# Patient Record
Sex: Female | Born: 1940 | Race: White | Hispanic: No | Marital: Married | Smoking: Never smoker
Health system: Southern US, Community
[De-identification: ages and names within clinical notes are randomized; demographics above are authoritative.]

## PROBLEM LIST (undated history)

## (undated) DIAGNOSIS — D649 Anemia, unspecified: Secondary | ICD-10-CM

## (undated) DIAGNOSIS — M51369 Other intervertebral disc degeneration, lumbar region without mention of lumbar back pain or lower extremity pain: Secondary | ICD-10-CM

## (undated) DIAGNOSIS — M5136 Other intervertebral disc degeneration, lumbar region: Secondary | ICD-10-CM

## (undated) DIAGNOSIS — L570 Actinic keratosis: Secondary | ICD-10-CM

## (undated) DIAGNOSIS — M5030 Other cervical disc degeneration, unspecified cervical region: Secondary | ICD-10-CM

## (undated) DIAGNOSIS — E7800 Pure hypercholesterolemia, unspecified: Secondary | ICD-10-CM

## (undated) DIAGNOSIS — K562 Volvulus: Secondary | ICD-10-CM

## (undated) DIAGNOSIS — K219 Gastro-esophageal reflux disease without esophagitis: Secondary | ICD-10-CM

## (undated) DIAGNOSIS — I10 Essential (primary) hypertension: Secondary | ICD-10-CM

## (undated) DIAGNOSIS — H903 Sensorineural hearing loss, bilateral: Secondary | ICD-10-CM

## (undated) DIAGNOSIS — K56609 Unspecified intestinal obstruction, unspecified as to partial versus complete obstruction: Secondary | ICD-10-CM

## (undated) DIAGNOSIS — H8110 Benign paroxysmal vertigo, unspecified ear: Secondary | ICD-10-CM

## (undated) DIAGNOSIS — J45909 Unspecified asthma, uncomplicated: Secondary | ICD-10-CM

## (undated) HISTORY — DX: Actinic keratosis: L57.0

## (undated) HISTORY — DX: Unspecified intestinal obstruction, unspecified as to partial versus complete obstruction: K56.609

## (undated) HISTORY — PX: MULTIPLE TOOTH EXTRACTIONS: SHX2053

## (undated) HISTORY — PX: APPENDECTOMY: SHX54

## (undated) HISTORY — DX: Other intervertebral disc degeneration, lumbar region: M51.36

## (undated) HISTORY — DX: Other cervical disc degeneration, unspecified cervical region: M50.30

## (undated) HISTORY — DX: Benign paroxysmal vertigo, unspecified ear: H81.10

## (undated) HISTORY — DX: Sensorineural hearing loss, bilateral: H90.3

## (undated) HISTORY — DX: Unspecified asthma, uncomplicated: J45.909

## (undated) HISTORY — DX: Volvulus: K56.2

## (undated) HISTORY — DX: Other intervertebral disc degeneration, lumbar region without mention of lumbar back pain or lower extremity pain: M51.369

## (undated) HISTORY — DX: Anemia, unspecified: D64.9

## (undated) HISTORY — DX: Pure hypercholesterolemia, unspecified: E78.00

## (undated) HISTORY — DX: Gastro-esophageal reflux disease without esophagitis: K21.9

---

## 1997-11-06 ENCOUNTER — Ambulatory Visit (HOSPITAL_COMMUNITY): Admission: RE | Admit: 1997-11-06 | Discharge: 1997-11-06 | Payer: Self-pay | Admitting: Endocrinology

## 1998-11-06 ENCOUNTER — Other Ambulatory Visit: Admission: RE | Admit: 1998-11-06 | Discharge: 1998-11-06 | Payer: Self-pay | Admitting: Obstetrics and Gynecology

## 1999-11-06 ENCOUNTER — Other Ambulatory Visit: Admission: RE | Admit: 1999-11-06 | Discharge: 1999-11-06 | Payer: Self-pay | Admitting: Obstetrics and Gynecology

## 2003-03-21 ENCOUNTER — Ambulatory Visit (HOSPITAL_COMMUNITY): Admission: RE | Admit: 2003-03-21 | Discharge: 2003-03-21 | Payer: Self-pay | Admitting: Family Medicine

## 2003-09-19 ENCOUNTER — Other Ambulatory Visit: Admission: RE | Admit: 2003-09-19 | Discharge: 2003-09-19 | Payer: Self-pay | Admitting: Obstetrics and Gynecology

## 2008-12-21 ENCOUNTER — Other Ambulatory Visit: Admission: RE | Admit: 2008-12-21 | Discharge: 2008-12-21 | Payer: Self-pay | Admitting: Obstetrics and Gynecology

## 2009-01-31 ENCOUNTER — Encounter: Admission: RE | Admit: 2009-01-31 | Discharge: 2009-01-31 | Payer: Self-pay | Admitting: Family Medicine

## 2010-01-31 ENCOUNTER — Encounter: Admission: RE | Admit: 2010-01-31 | Discharge: 2010-01-31 | Payer: Self-pay | Admitting: Family Medicine

## 2010-02-04 ENCOUNTER — Encounter: Admission: RE | Admit: 2010-02-04 | Discharge: 2010-02-04 | Payer: Self-pay | Admitting: Family Medicine

## 2011-02-17 ENCOUNTER — Other Ambulatory Visit: Payer: Self-pay | Admitting: Family Medicine

## 2011-02-17 DIAGNOSIS — Z1231 Encounter for screening mammogram for malignant neoplasm of breast: Secondary | ICD-10-CM

## 2011-03-10 ENCOUNTER — Ambulatory Visit
Admission: RE | Admit: 2011-03-10 | Discharge: 2011-03-10 | Disposition: A | Discharge status: Home / Self Care | Payer: Medicare Other | Source: Ambulatory Visit | Attending: Family Medicine | Admitting: Family Medicine

## 2011-03-10 DIAGNOSIS — Z1231 Encounter for screening mammogram for malignant neoplasm of breast: Secondary | ICD-10-CM

## 2011-11-19 ENCOUNTER — Emergency Department (HOSPITAL_COMMUNITY): Payer: Medicare Other

## 2011-11-19 ENCOUNTER — Inpatient Hospital Stay (HOSPITAL_COMMUNITY)
Admission: EM | Admit: 2011-11-19 | Discharge: 2011-11-29 | DRG: 331 | Disposition: A | Discharge status: Home / Self Care | Payer: Medicare Other | Attending: General Surgery | Admitting: General Surgery

## 2011-11-19 ENCOUNTER — Encounter (HOSPITAL_COMMUNITY): Payer: Self-pay | Admitting: Emergency Medicine

## 2011-11-19 DIAGNOSIS — K56609 Unspecified intestinal obstruction, unspecified as to partial versus complete obstruction: Secondary | ICD-10-CM

## 2011-11-19 DIAGNOSIS — K565 Intestinal adhesions [bands], unspecified as to partial versus complete obstruction: Principal | ICD-10-CM | POA: Diagnosis present

## 2011-11-19 DIAGNOSIS — R339 Retention of urine, unspecified: Secondary | ICD-10-CM | POA: Diagnosis not present

## 2011-11-19 DIAGNOSIS — K560 Paralytic ileus: Secondary | ICD-10-CM | POA: Diagnosis not present

## 2011-11-19 DIAGNOSIS — R110 Nausea: Secondary | ICD-10-CM

## 2011-11-19 DIAGNOSIS — I10 Essential (primary) hypertension: Secondary | ICD-10-CM | POA: Diagnosis present

## 2011-11-19 DIAGNOSIS — K562 Volvulus: Secondary | ICD-10-CM | POA: Diagnosis present

## 2011-11-19 DIAGNOSIS — R109 Unspecified abdominal pain: Secondary | ICD-10-CM

## 2011-11-19 HISTORY — DX: Essential (primary) hypertension: I10

## 2011-11-19 LAB — URINALYSIS, ROUTINE W REFLEX MICROSCOPIC
Bilirubin Urine: NEGATIVE
Glucose, UA: NEGATIVE mg/dL
Hgb urine dipstick: NEGATIVE
Ketones, ur: NEGATIVE mg/dL
Leukocytes, UA: NEGATIVE
Nitrite: NEGATIVE
Protein, ur: 30 mg/dL — AB
Specific Gravity, Urine: 1.037 — ABNORMAL HIGH (ref 1.005–1.030)
Urobilinogen, UA: 0.2 mg/dL (ref 0.0–1.0)
pH: 6 (ref 5.0–8.0)

## 2011-11-19 LAB — COMPREHENSIVE METABOLIC PANEL
ALT: 19 U/L (ref 0–35)
AST: 20 U/L (ref 0–37)
Alkaline Phosphatase: 59 U/L (ref 39–117)
CO2: 29 mEq/L (ref 19–32)
Calcium: 9.8 mg/dL (ref 8.4–10.5)
Chloride: 90 mEq/L — ABNORMAL LOW (ref 96–112)
GFR calc Af Amer: >90 mL/min (ref >90)
GFR calc non Af Amer: 89 mL/min — ABNORMAL LOW (ref >90)
Glucose, Bld: 115 mg/dL — ABNORMAL HIGH (ref 70–99)
Potassium: 4 mEq/L (ref 3.5–5.1)
Sodium: 130 mEq/L — ABNORMAL LOW (ref 135–145)

## 2011-11-19 LAB — URINE MICROSCOPIC-ADD ON

## 2011-11-19 LAB — CBC WITH DIFFERENTIAL/PLATELET
Basophils Absolute: 0 10*3/uL (ref 0.0–0.1)
Basophils Relative: 0 % (ref 0–1)
Eosinophils Absolute: 0 10*3/uL (ref 0.0–0.7)
Eosinophils Relative: 0 % (ref 0–5)
HCT: 47.4 % — ABNORMAL HIGH (ref 36.0–46.0)
Hemoglobin: 17 g/dL — ABNORMAL HIGH (ref 12.0–15.0)
Lymphocytes Relative: 8 % — ABNORMAL LOW (ref 12–46)
Lymphs Abs: 1.6 10*3/uL (ref 0.7–4.0)
MCH: 31.8 pg (ref 26.0–34.0)
MCHC: 35.9 g/dL (ref 30.0–36.0)
MCV: 88.6 fL (ref 78.0–100.0)
Monocytes Absolute: 1.7 10*3/uL — ABNORMAL HIGH (ref 0.1–1.0)
Monocytes Relative: 9 % (ref 3–12)
Neutro Abs: 15.7 10*3/uL — ABNORMAL HIGH (ref 1.7–7.7)
Neutrophils Relative %: 83 % — ABNORMAL HIGH (ref 43–77)
Platelets: 120 10*3/uL — ABNORMAL LOW (ref 150–400)
RBC: 5.35 MIL/uL — ABNORMAL HIGH (ref 3.87–5.11)
RDW: 13.3 % (ref 11.5–15.5)
WBC: 19 10*3/uL — ABNORMAL HIGH (ref 4.0–10.5)

## 2011-11-19 LAB — LIPASE, BLOOD: Lipase: 48 U/L (ref 11–59)

## 2011-11-19 LAB — LACTIC ACID, PLASMA: Lactic Acid, Venous: 1.7 mmol/L (ref 0.5–2.2)

## 2011-11-19 MED ORDER — HYDROMORPHONE HCL PF 1 MG/ML IJ SOLN
0.5000 mg | Freq: Once | INTRAMUSCULAR | Status: AC
  Administered 2011-11-19: 0.5 mg via INTRAVENOUS
  Filled 2011-11-19: qty 1

## 2011-11-19 MED ORDER — SODIUM CHLORIDE 0.9 % IV SOLN
1000.0000 mL | INTRAVENOUS | Status: DC
  Administered 2011-11-19 (×2): 1000 mL via INTRAVENOUS

## 2011-11-19 MED ORDER — LIDOCAINE HCL 2 % EX GEL
CUTANEOUS | Status: AC
  Administered 2011-11-19: 22:00:00
  Filled 2011-11-19: qty 10

## 2011-11-19 MED ORDER — ONDANSETRON HCL 4 MG/2ML IJ SOLN: 4.0000 mg | Freq: Once | INTRAMUSCULAR | Status: AC

## 2011-11-19 MED ORDER — SODIUM CHLORIDE 0.9 % IV BOLUS (SEPSIS)
1000.0000 mL | Freq: Once | INTRAVENOUS | Status: AC
  Administered 2011-11-19: 1000 mL via INTRAVENOUS

## 2011-11-19 MED ORDER — ONDANSETRON HCL 4 MG/2ML IJ SOLN
4.0000 mg | Freq: Once | INTRAMUSCULAR | Status: AC
  Administered 2011-11-19: 4 mg via INTRAVENOUS
  Filled 2011-11-19: qty 2

## 2011-11-19 NOTE — ED Provider Notes (Signed)
History     CSN: [id number]  Arrival date & time 11/19/11  1700   First MD Initiated Contact with Patient 11/19/11 1721      Chief Complaint  Patient presents with  . Abdominal Pain    (Consider location/radiation/quality/duration/timing/severity/associated sxs/prior treatment) HPI Comments: 71 y/o presents with her spouse with abdominal pain since yesterday. She and her husband were on vacation in [place] and [place] until today when they flew home. Two nights ago she ate a salad with chicken on it and thinks it may have made her sick. Her husband did not have the same meal. The abdominal pain gradually began after the meal, and has progressed since. Pain located in her lower abdomen. States it is difficult for her to start her urination, but has no dysuria. She has not had a bowel movement since the pain began. Admits to nausea and one episode of vomiting earlier today around 3:00 pm. Admits to decreased appetite. Drank soda and gatorade today and was able to keep that down. Denies fever, chills, chest pain, SOB, back pain.  The history is provided by the patient and the spouse.    Past Medical History  Diagnosis Date  . Hypertension     Past Surgical History  Procedure Date  . Appendectomy     No family history on file.  History  Substance Use Topics  . Smoking status: Never Smoker   . Smokeless tobacco: Not on file  . Alcohol Use: No    OB History    Grav Para Term Preterm Abortions TAB SAB Ect Mult Living                  Review of Systems  Constitutional: Negative for fever and chills.  Respiratory: Negative for shortness of breath.   Cardiovascular: Negative for chest pain.  Gastrointestinal: Positive for nausea, vomiting, abdominal pain, constipation and abdominal distention. Negative for diarrhea.  Genitourinary: Positive for decreased urine volume. Negative for dysuria, hematuria and flank pain.  Musculoskeletal: Negative for back pain.     Allergies  Review of patient's allergies indicates no known allergies.  Home Medications   Current Outpatient Rx  Name Route Sig Dispense Refill  . ACETAMINOPHEN 325 MG PO TABS Oral Take 650 mg by mouth every 6 (six) hours as needed.    Marland Kitchen BISMUTH SUBSALICYLATE 262 MG PO CHEW Oral Chew 524 mg by mouth as needed. Upset stomach    . LISINOPRIL 5 MG PO TABS Oral Take 5 mg by mouth daily.    . SENNA-DOCUSATE SODIUM 8.6-50 MG PO TABS Oral Take 2 tablets by mouth daily.      BP 158/72  Pulse 75  Temp 98.9 F (37.2 C) (Oral)  Resp 18  SpO2 99%  Physical Exam  Nursing note and vitals reviewed. Constitutional: She is oriented to person, place, and time. She appears well-developed and well-nourished. No distress.  HENT:  Head: Normocephalic and atraumatic.  Mouth/Throat: Oropharynx is clear and moist.  Eyes: Conjunctivae and EOM are normal. Pupils are equal, round, and reactive to light.  Neck: Neck supple.  Cardiovascular: Normal rate, regular rhythm and normal heart sounds.   Pulmonary/Chest: Effort normal and breath sounds normal.  Abdominal: Bowel sounds are normal. She exhibits distension. There is tenderness in the periumbilical area and suprapubic area. There is rigidity and guarding. There is no CVA tenderness.  Neurological: She is alert and oriented to person, place, and time.  Skin: Skin is warm and dry. No rash noted.  Psychiatric: She has a normal mood and affect. Her behavior is normal.    ED Course  Procedures (including critical care time)   Labs Reviewed  COMPREHENSIVE METABOLIC PANEL  CBC WITH DIFFERENTIAL  URINALYSIS, ROUTINE W REFLEX MICROSCOPIC  LIPASE, BLOOD   Results for orders placed during the hospital encounter of 11/19/11  CBC WITH DIFFERENTIAL      Component Value Range   WBC 19.0 (*) 4.0 - 10.5 K/uL   RBC 5.35 (*) 3.87 - 5.11 MIL/uL   Hemoglobin 17.0 (*) 12.0 - 15.0 g/dL   HCT 16.1 (*) 09.6 - 04.5 %   MCV 88.6  78.0 - 100.0 fL   MCH 31.8   26.0 - 34.0 pg   MCHC 35.9  30.0 - 36.0 g/dL   RDW 40.9  81.1 - 91.4 %   Platelets 120 (*) 150 - 400 K/uL   Neutrophils Relative 83 (*) 43 - 77 %   Neutro Abs 15.7 (*) 1.7 - 7.7 K/uL   Lymphocytes Relative 8 (*) 12 - 46 %   Lymphs Abs 1.6  0.7 - 4.0 K/uL   Monocytes Relative 9  3 - 12 %   Monocytes Absolute 1.7 (*) 0.1 - 1.0 K/uL   Eosinophils Relative 0  0 - 5 %   Eosinophils Absolute 0.0  0.0 - 0.7 K/uL   Basophils Relative 0  0 - 1 %   Basophils Absolute 0.0  0.0 - 0.1 K/uL   Smear Review SPECIMEN CHECKED FOR CLOTS    LIPASE, BLOOD      Component Value Range   Lipase 48  11 - 59 U/L     Dg Abd Acute W/chest  11/19/2011  *RADIOLOGY REPORT*  Clinical Data: Lower abdominal pain, nausea and vomiting  ACUTE ABDOMEN SERIES (ABDOMEN 2 VIEW & CHEST 1 VIEW)  Comparison: 01/31/2010, 06/01/2007  Findings: Hyperinflation noted, stable.  Normal heart size and vascularity.  Negative for pneumonia, edema, collapse, consolidation, effusion or pneumothorax.  No free air.  Dilated loops of small bowel in the left abdomen measuring 4 cm in diameter.  Associated small bowel air fluid levels on the upright exam.  Decompression of the colon.  Findings compatible with small bowel obstruction.  Degenerative changes of the spine.  Pelvic calcifications consistent with venous phleboliths.  IMPRESSION: Dilated small bowel with associated air fluid levels and decompressed colon compatible with small bowel obstruction pattern. No free air.  Original Report Authenticated By: Judie Petit. Ruel Favors, M.D.     1. Small bowel obstruction   2. Abdominal pain   3. Nausea       MDM  71 y/o female with abdominal pain and nausea. Acute abdominal series showing evidence of small bowel obstruction. Elevated white count. Consult for surgery and patient will be admitted. Case discussed with Dr. Rhunette Croft who agrees with plan of care.        Trevor Mace, PA-C 11/19/11 1923

## 2011-11-19 NOTE — ED Notes (Signed)
Lab called stating CMP was contaminated, in need of another draw. Pt unable to urinate. In and out cath will be performed by tech.

## 2011-11-19 NOTE — ED Notes (Addendum)
Attempted x2 in both nares to insert NG tube with Annice Pih, RN with no success. Informed physician. Physician informed me he'll make an attempt.

## 2011-11-19 NOTE — H&P (Signed)
Dana Hall is an 71 y.o. female.   Chief Complaint: abdominal pain, no flatus or bm in 2 days HPI: 74 yof who is otherwise healthy with only appy as a child who presents after a 2 day history of increasing abdominal pain, distention, nausea, some emesis and no flatus or bm in past 2 days.  She was on trip to [facility] when this started and flew home with these symptoms.  She denies fevers.  She denies any prior episodes.  Nothing was making this better and she was unable to keep down any oral intake. She has decreased uop as well. Has csc in last 2 years.   Past Medical History  Diagnosis Date  . Hypertension     Past Surgical History  Procedure Date  . Appendectomy   . Multiple tooth extractions     age 36    History reviewed. No pertinent family history. Social History: nonsmoker, very occasional glass of wine Allergies: No Known Allergies  Lisinopril qd  Results for orders placed during the hospital encounter of 11/19/11 (from the past 48 hour(s))  CBC WITH DIFFERENTIAL     Status: Abnormal   Collection Time   11/19/11  5:55 PM      Component Value Range Comment   WBC 19.0 (*) 4.0 - 10.5 K/uL    RBC 5.35 (*) 3.87 - 5.11 MIL/uL    Hemoglobin 17.0 (*) 12.0 - 15.0 g/dL    HCT 40.9 (*) 81.1 - 46.0 %    MCV 88.6  78.0 - 100.0 fL    MCH 31.8  26.0 - 34.0 pg    MCHC 35.9  30.0 - 36.0 g/dL    RDW 91.4  78.2 - 95.6 %    Platelets 120 (*) 150 - 400 K/uL    Neutrophils Relative 83 (*) 43 - 77 %    Neutro Abs 15.7 (*) 1.7 - 7.7 K/uL    Lymphocytes Relative 8 (*) 12 - 46 %    Lymphs Abs 1.6  0.7 - 4.0 K/uL    Monocytes Relative 9  3 - 12 %    Monocytes Absolute 1.7 (*) 0.1 - 1.0 K/uL    Eosinophils Relative 0  0 - 5 %    Eosinophils Absolute 0.0  0.0 - 0.7 K/uL    Basophils Relative 0  0 - 1 %    Basophils Absolute 0.0  0.0 - 0.1 K/uL    Smear Review SPECIMEN CHECKED FOR CLOTS   PLATELET CLUMPS NOTED ON SMEAR  LIPASE, BLOOD     Status: Normal   Collection Time   11/19/11  5:55 PM      Component Value Range Comment   Lipase 48  11 - 59 U/L   LACTIC ACID, PLASMA     Status: Normal   Collection Time   11/19/11  6:55 PM      Component Value Range Comment   Lactic Acid, Venous 1.7  0.5 - 2.2 mmol/L    Dg Abd Acute W/chest  11/19/2011  *RADIOLOGY REPORT*  Clinical Data: Lower abdominal pain, nausea and vomiting  ACUTE ABDOMEN SERIES (ABDOMEN 2 VIEW & CHEST 1 VIEW)  Comparison: 01/31/2010, 06/01/2007  Findings: Hyperinflation noted, stable.  Normal heart size and vascularity.  Negative for pneumonia, edema, collapse, consolidation, effusion or pneumothorax.  No free air.  Dilated loops of small bowel in the left abdomen measuring 4 cm in diameter.  Associated small bowel air fluid levels on the upright exam.  Decompression of  the colon.  Findings compatible with small bowel obstruction.  Degenerative changes of the spine.  Pelvic calcifications consistent with venous phleboliths.  IMPRESSION: Dilated small bowel with associated air fluid levels and decompressed colon compatible with small bowel obstruction pattern. No free air.  Original Report Authenticated By: Judie Petit. Ruel Favors, M.D.    Review of Systems  Constitutional: Negative for fever and chills.  Respiratory: Negative for shortness of breath.   Gastrointestinal: Positive for nausea, vomiting and abdominal pain. Negative for diarrhea and constipation.    Blood pressure 158/72, pulse 75, temperature 98.9 F (37.2 C), temperature source Oral, resp. rate 18, SpO2 99.00%. Physical Exam  Vitals reviewed. Constitutional: She appears well-developed and well-nourished. No distress.  Eyes: No scleral icterus.  Neck: Neck supple.  Cardiovascular: Normal rate, regular rhythm and normal heart sounds.   Respiratory: Effort normal and breath sounds normal. She has no wheezes. She has no rales.  GI: She exhibits distension. Bowel sounds are absent. There is no hepatosplenomegaly. There is tenderness in the right lower  quadrant, suprapubic area and left lower quadrant. There is no rigidity, no rebound and no guarding. No hernia. Hernia confirmed negative in the ventral area, confirmed negative in the right inguinal area and confirmed negative in the left inguinal area.    Lymphadenopathy:    She has no cervical adenopathy.     Assessment/Plan SBO   She appears clinically and by plain films to have a complete sbo right now.  I do not think she needs ct scan tonight and I am concerned about giving her contrast due to dehydration.  She still has bmet pending right now. I think she is very dry and will give her volume tonight.  She does not need to go to or right now based on exam and I think a trial of conservative management reasonable.  Her wbc is elevated but vitals otherwise are ok.  Will place ng tube. Will place foley as she thinks she cannot empty and to monitor overnight.  Will check bmet when back.  Recheck films, labs in am.  We discussed possibly role of surgical therapy as well.   [name] 11/19/2011, 8:27 PM

## 2011-11-19 NOTE — ED Notes (Signed)
Pt presenting to ed with c/o lower abdominal pain pt states positive nausea and vomiting pt states bowel movement x 2 days ago pt states sent to ed by Md Sun. Pt states she has also had decreased urine output. Pt denies painful urination

## 2011-11-19 NOTE — Progress Notes (Signed)
Pt confirmed pcp is Dana Hall, Dana Hall

## 2011-11-20 ENCOUNTER — Encounter (HOSPITAL_COMMUNITY): Admission: EM | Disposition: A | Discharge status: Home / Self Care | Payer: Self-pay | Source: Home / Self Care

## 2011-11-20 ENCOUNTER — Inpatient Hospital Stay (HOSPITAL_COMMUNITY): Payer: Medicare Other

## 2011-11-20 ENCOUNTER — Encounter (HOSPITAL_COMMUNITY): Payer: Self-pay | Admitting: Anesthesiology

## 2011-11-20 ENCOUNTER — Inpatient Hospital Stay (HOSPITAL_COMMUNITY): Payer: Medicare Other | Admitting: Anesthesiology

## 2011-11-20 DIAGNOSIS — K562 Volvulus: Secondary | ICD-10-CM

## 2011-11-20 HISTORY — DX: Volvulus: K56.2

## 2011-11-20 HISTORY — PX: LAPAROTOMY: SHX154

## 2011-11-20 LAB — CBC
HCT: 49.5 % — ABNORMAL HIGH (ref 36.0–46.0)
Hemoglobin: 17.2 g/dL — ABNORMAL HIGH (ref 12.0–15.0)
MCV: 90.3 fL (ref 78.0–100.0)
WBC: 21.2 10*3/uL — ABNORMAL HIGH (ref 4.0–10.5)

## 2011-11-20 LAB — BASIC METABOLIC PANEL
BUN: 15 mg/dL (ref 6–23)
CO2: 23 mEq/L (ref 19–32)
Chloride: 97 mEq/L (ref 96–112)
GFR calc Af Amer: >90 mL/min (ref >90)
Potassium: 4.3 mEq/L (ref 3.5–5.1)

## 2011-11-20 LAB — SURGICAL PCR SCREEN
MRSA, PCR: NEGATIVE
Staphylococcus aureus: NEGATIVE

## 2011-11-20 SURGERY — LAPAROTOMY, EXPLORATORY
Anesthesia: General | Site: Abdomen | Wound class: Clean Contaminated

## 2011-11-20 MED ORDER — DEXTROSE 5 % IV SOLN
2.0000 g | INTRAVENOUS | Status: AC
  Administered 2011-11-20: 2 g via INTRAVENOUS
  Filled 2011-11-20: qty 2

## 2011-11-20 MED ORDER — ENOXAPARIN SODIUM 40 MG/0.4ML ~~LOC~~ SOLN
40.0000 mg | SUBCUTANEOUS | Status: DC
  Filled 2011-11-20: qty 0.4

## 2011-11-20 MED ORDER — DIPHENHYDRAMINE HCL 50 MG/ML IJ SOLN: 12.5000 mg | Freq: Four times a day (QID) | INTRAMUSCULAR | Status: DC | PRN

## 2011-11-20 MED ORDER — LACTATED RINGERS IV SOLN
INTRAVENOUS | Status: DC | PRN
  Administered 2011-11-20: 10:00:00 via INTRAVENOUS

## 2011-11-20 MED ORDER — ONDANSETRON HCL 4 MG PO TABS: 4.0000 mg | ORAL_TABLET | Freq: Four times a day (QID) | ORAL | Status: DC | PRN

## 2011-11-20 MED ORDER — SODIUM CHLORIDE 0.9 % IR SOLN
Status: DC | PRN
  Administered 2011-11-20: 1

## 2011-11-20 MED ORDER — SODIUM CHLORIDE 0.9 % IV BOLUS (SEPSIS): 500.0000 mL | Freq: Once | INTRAVENOUS | Status: DC

## 2011-11-20 MED ORDER — HYDRALAZINE HCL 20 MG/ML IJ SOLN
10.0000 mg | Freq: Four times a day (QID) | INTRAMUSCULAR | Status: DC | PRN
  Administered 2011-11-20: 10 mg via INTRAVENOUS
  Filled 2011-11-20: qty 1

## 2011-11-20 MED ORDER — HYDROMORPHONE HCL PF 1 MG/ML IJ SOLN: 0.2500 mg | INTRAMUSCULAR | Status: DC | PRN

## 2011-11-20 MED ORDER — PROPOFOL 10 MG/ML IV EMUL
INTRAVENOUS | Status: DC | PRN
  Administered 2011-11-20: 150 mg via INTRAVENOUS

## 2011-11-20 MED ORDER — ONDANSETRON HCL 4 MG/2ML IJ SOLN: 4.0000 mg | Freq: Four times a day (QID) | INTRAMUSCULAR | Status: DC | PRN

## 2011-11-20 MED ORDER — CISATRACURIUM BESYLATE (PF) 10 MG/5ML IV SOLN
INTRAVENOUS | Status: DC | PRN
  Administered 2011-11-20: 2 mg via INTRAVENOUS
  Administered 2011-11-20: 4 mg via INTRAVENOUS
  Administered 2011-11-20: 8 mg via INTRAVENOUS

## 2011-11-20 MED ORDER — DIPHENHYDRAMINE HCL 12.5 MG/5ML PO ELIX: 12.5000 mg | ORAL_SOLUTION | Freq: Four times a day (QID) | ORAL | Status: DC | PRN

## 2011-11-20 MED ORDER — MORPHINE SULFATE 2 MG/ML IJ SOLN
2.0000 mg | INTRAMUSCULAR | Status: DC | PRN
  Administered 2011-11-20: 2 mg via INTRAVENOUS
  Filled 2011-11-20: qty 1

## 2011-11-20 MED ORDER — PANTOPRAZOLE SODIUM 40 MG IV SOLR
40.0000 mg | Freq: Every day | INTRAVENOUS | Status: DC
  Filled 2011-11-20: qty 40

## 2011-11-20 MED ORDER — HYDROMORPHONE 0.3 MG/ML IV SOLN: INTRAVENOUS | Status: DC

## 2011-11-20 MED ORDER — FENTANYL CITRATE 0.05 MG/ML IJ SOLN
INTRAMUSCULAR | Status: DC | PRN
  Administered 2011-11-20: 50 ug via INTRAVENOUS
  Administered 2011-11-20: 100 ug via INTRAVENOUS
  Administered 2011-11-20 (×3): 50 ug via INTRAVENOUS

## 2011-11-20 MED ORDER — BIOTENE DRY MOUTH MT LIQD
15.0000 mL | Freq: Two times a day (BID) | OROMUCOSAL | Status: DC
  Administered 2011-11-20 – 2011-11-26 (×13): 15 mL via OROMUCOSAL

## 2011-11-20 MED ORDER — ACETAMINOPHEN 325 MG PO TABS: 650.0000 mg | ORAL_TABLET | Freq: Four times a day (QID) | ORAL | Status: DC | PRN

## 2011-11-20 MED ORDER — NALOXONE HCL 0.4 MG/ML IJ SOLN: 0.4000 mg | INTRAMUSCULAR | Status: DC | PRN

## 2011-11-20 MED ORDER — METOPROLOL TARTRATE 1 MG/ML IV SOLN
5.0000 mg | Freq: Four times a day (QID) | INTRAVENOUS | Status: DC
  Administered 2011-11-20 – 2011-11-29 (×36): 5 mg via INTRAVENOUS
  Filled 2011-11-20 (×40): qty 5

## 2011-11-20 MED ORDER — CEFOXITIN SODIUM-DEXTROSE 1-4 GM-% IV SOLR (PREMIX)
INTRAVENOUS | Status: AC
  Filled 2011-11-20: qty 100

## 2011-11-20 MED ORDER — HEPARIN SODIUM (PORCINE) 5000 UNIT/ML IJ SOLN
5000.0000 [IU] | Freq: Three times a day (TID) | INTRAMUSCULAR | Status: DC
  Administered 2011-11-21 – 2011-11-29 (×25): 5000 [IU] via SUBCUTANEOUS
  Filled 2011-11-20 (×27): qty 1

## 2011-11-20 MED ORDER — DEXTROSE 5 % IV SOLN
2.0000 g | Freq: Three times a day (TID) | INTRAVENOUS | Status: AC
  Administered 2011-11-20 – 2011-11-21 (×3): 2 g via INTRAVENOUS
  Filled 2011-11-20 (×3): qty 2

## 2011-11-20 MED ORDER — PROMETHAZINE HCL 25 MG/ML IJ SOLN: 6.2500 mg | INTRAMUSCULAR | Status: DC | PRN

## 2011-11-20 MED ORDER — HYDROMORPHONE 0.3 MG/ML IV SOLN
INTRAVENOUS | Status: DC
  Administered 2011-11-20: 13:00:00 via INTRAVENOUS
  Administered 2011-11-20: 0.599 mg via INTRAVENOUS
  Administered 2011-11-20 – 2011-11-21 (×2): 0.999 mg via INTRAVENOUS
  Administered 2011-11-21: 0.6 mg via INTRAVENOUS
  Administered 2011-11-21: 0.1999 mg via INTRAVENOUS
  Administered 2011-11-21: 0.2 mg via INTRAVENOUS
  Administered 2011-11-21: 0.6 mg via INTRAVENOUS
  Administered 2011-11-22: 0.399 mg via INTRAVENOUS
  Administered 2011-11-22: 0.6 mg via INTRAVENOUS
  Administered 2011-11-22: 0.2 mg via INTRAVENOUS
  Administered 2011-11-23: 0.999 mg via INTRAVENOUS
  Administered 2011-11-23: 0.399 mg via INTRAVENOUS
  Administered 2011-11-23: 03:00:00 via INTRAVENOUS
  Administered 2011-11-23: 0.999 mg via INTRAVENOUS
  Administered 2011-11-23: 0.799 mg via INTRAVENOUS
  Administered 2011-11-23: 0.6 mg via INTRAVENOUS
  Administered 2011-11-24: 0.4 mg via INTRAVENOUS
  Administered 2011-11-24: 0.799 mg via INTRAVENOUS
  Administered 2011-11-24: 0.4 mg via INTRAVENOUS
  Administered 2011-11-24: 0.5999 mg via INTRAVENOUS
  Administered 2011-11-25: 0.399 mg via INTRAVENOUS
  Administered 2011-11-25: 21:00:00 via INTRAVENOUS
  Administered 2011-11-25: 0.4 mg via INTRAVENOUS
  Administered 2011-11-25: 0.6 mg via INTRAVENOUS
  Administered 2011-11-25: 0.199 mg via INTRAVENOUS
  Administered 2011-11-26: 0.6 mg via INTRAVENOUS
  Administered 2011-11-26 (×2): 0.2 mg via INTRAVENOUS
  Filled 2011-11-20 (×2): qty 25

## 2011-11-20 MED ORDER — CHLORHEXIDINE GLUCONATE 0.12 % MT SOLN
15.0000 mL | Freq: Two times a day (BID) | OROMUCOSAL | Status: DC
  Administered 2011-11-21 – 2011-11-26 (×11): 15 mL via OROMUCOSAL
  Filled 2011-11-20 (×18): qty 15

## 2011-11-20 MED ORDER — GLYCOPYRROLATE 0.2 MG/ML IJ SOLN
INTRAMUSCULAR | Status: DC | PRN
  Administered 2011-11-20: 0.6 mg via INTRAVENOUS

## 2011-11-20 MED ORDER — SODIUM CHLORIDE 0.9 % IJ SOLN: 9.0000 mL | INTRAMUSCULAR | Status: DC | PRN

## 2011-11-20 MED ORDER — SODIUM CHLORIDE 0.9 % IV SOLN
INTRAVENOUS | Status: DC
  Administered 2011-11-20: 03:00:00 via INTRAVENOUS

## 2011-11-20 MED ORDER — SUCCINYLCHOLINE CHLORIDE 20 MG/ML IJ SOLN
INTRAMUSCULAR | Status: DC | PRN
  Administered 2011-11-20: 140 mg via INTRAVENOUS

## 2011-11-20 MED ORDER — ACETAMINOPHEN 650 MG RE SUPP: 650.0000 mg | Freq: Four times a day (QID) | RECTAL | Status: DC | PRN

## 2011-11-20 MED ORDER — MORPHINE SULFATE 4 MG/ML IJ SOLN
4.0000 mg | Freq: Once | INTRAMUSCULAR | Status: AC
  Administered 2011-11-20: 4 mg via INTRAVENOUS
  Filled 2011-11-20: qty 1

## 2011-11-20 MED ORDER — POTASSIUM CHLORIDE IN NACL 20-0.9 MEQ/L-% IV SOLN
INTRAVENOUS | Status: DC
  Administered 2011-11-20 – 2011-11-26 (×16): via INTRAVENOUS
  Administered 2011-11-26 – 2011-11-27 (×2): 125 mL via INTRAVENOUS
  Administered 2011-11-28: 50 mL via INTRAVENOUS
  Filled 2011-11-20 (×23): qty 1000

## 2011-11-20 MED ORDER — ONDANSETRON HCL 4 MG/2ML IJ SOLN
INTRAMUSCULAR | Status: DC | PRN
  Administered 2011-11-20 (×2): 2 mg via INTRAVENOUS

## 2011-11-20 MED ORDER — SODIUM CHLORIDE 0.9 % IV SOLN
INTRAVENOUS | Status: DC | PRN
  Administered 2011-11-20: 10:00:00 via INTRAVENOUS

## 2011-11-20 MED ORDER — LIDOCAINE HCL (CARDIAC) 20 MG/ML IV SOLN
INTRAVENOUS | Status: DC | PRN
  Administered 2011-11-20: 20 mg via INTRAVENOUS

## 2011-11-20 MED ORDER — HYDROMORPHONE 0.3 MG/ML IV SOLN
INTRAVENOUS | Status: AC
  Filled 2011-11-20: qty 25

## 2011-11-20 MED ORDER — NEOSTIGMINE METHYLSULFATE 1 MG/ML IJ SOLN
INTRAMUSCULAR | Status: DC | PRN
  Administered 2011-11-20: 4 mg via INTRAVENOUS

## 2011-11-20 MED ORDER — ONDANSETRON HCL 4 MG/2ML IJ SOLN
4.0000 mg | Freq: Four times a day (QID) | INTRAMUSCULAR | Status: DC | PRN
  Administered 2011-11-20: 4 mg via INTRAVENOUS
  Filled 2011-11-20: qty 2

## 2011-11-20 MED ORDER — LACTATED RINGERS IV SOLN
INTRAVENOUS | Status: DC | PRN
  Administered 2011-11-20: 11:00:00 via INTRAVENOUS

## 2011-11-20 SURGICAL SUPPLY — 36 items
APPLICATOR COTTON TIP 6IN STRL (MISCELLANEOUS) ×1 IMPLANT
BLADE EXTENDED COATED 6.5IN (ELECTRODE) ×1 IMPLANT
BLADE HEX COATED 2.75 (ELECTRODE) ×2 IMPLANT
CANISTER SUCTION 2500CC (MISCELLANEOUS) ×1 IMPLANT
CLOTH BEACON ORANGE TIMEOUT ST (SAFETY) ×2 IMPLANT
COVER MAYO STAND STRL (DRAPES) ×1 IMPLANT
DRAPE LAPAROSCOPIC ABDOMINAL (DRAPES) ×2 IMPLANT
DRAPE WARM FLUID 44X44 (DRAPE) ×1 IMPLANT
ELECT REM PT RETURN 9FT ADLT (ELECTROSURGICAL) ×2
ELECTRODE REM PT RTRN 9FT ADLT (ELECTROSURGICAL) ×1 IMPLANT
GLOVE BIOGEL PI IND STRL 7.0 (GLOVE) ×1 IMPLANT
GLOVE BIOGEL PI INDICATOR 7.0 (GLOVE) ×1
GLOVE EUDERMIC 7 POWDERFREE (GLOVE) ×3 IMPLANT
GOWN STRL NON-REIN LRG LVL3 (GOWN DISPOSABLE) ×3 IMPLANT
GOWN STRL REIN XL XLG (GOWN DISPOSABLE) ×4 IMPLANT
KIT BASIN OR (CUSTOM PROCEDURE TRAY) ×2 IMPLANT
NS IRRIG 1000ML POUR BTL (IV SOLUTION) ×3 IMPLANT
PACK GENERAL/GYN (CUSTOM PROCEDURE TRAY) ×2 IMPLANT
SPONGE GAUZE 4X4 12PLY (GAUZE/BANDAGES/DRESSINGS) ×1 IMPLANT
SPONGE LAP 18X18 X RAY DECT (DISPOSABLE) IMPLANT
STAPLER VISISTAT 35W (STAPLE) ×2 IMPLANT
SUCTION POOLE TIP (SUCTIONS) ×1 IMPLANT
SUT PDS AB 1 CTX 36 (SUTURE) ×2 IMPLANT
SUT PDS AB 1 TP1 96 (SUTURE) ×3 IMPLANT
SUT SILK 2 0 (SUTURE) ×2
SUT SILK 2 0 SH CR/8 (SUTURE) ×1 IMPLANT
SUT SILK 2-0 18XBRD TIE 12 (SUTURE) IMPLANT
SUT SILK 3 0 (SUTURE) ×2
SUT SILK 3 0 SH CR/8 (SUTURE) ×1 IMPLANT
SUT SILK 3-0 18XBRD TIE 12 (SUTURE) IMPLANT
SUT VICRYL 2 0 18  UND BR (SUTURE)
SUT VICRYL 2 0 18 UND BR (SUTURE) IMPLANT
TAPE CLOTH SURG 4X10 WHT LF (GAUZE/BANDAGES/DRESSINGS) ×1 IMPLANT
TOWEL OR 17X26 10 PK STRL BLUE (TOWEL DISPOSABLE) ×5 IMPLANT
TRAY FOLEY CATH 14FRSI W/METER (CATHETERS) IMPLANT
YANKAUER SUCT BULB TIP NO VENT (SUCTIONS) ×2 IMPLANT

## 2011-11-20 NOTE — ED Provider Notes (Signed)
Medical screening examination/treatment/procedure(s) were conducted as a shared visit with non-physician practitioner(s) and myself.  I personally evaluated the patient during the encounter.  Pt with SBO. NG lavage placed, surgery informed. No CT at this time per surgery. Pt NPO and getting hydration. Admit to Surgery.  Derwood Kaplan, MD 11/20/11 [phone]

## 2011-11-20 NOTE — Op Note (Signed)
Patient Name:           Dana Hall   Date of Surgery:        11/20/2011 Pre op Diagnosis:      acute small bowel obstruction  Post op Diagnosis:    Acute strangulated small bowel obstruction due to adhesions  Procedure:                 Exploratory laparotomy, lysis of adhesions, small bowel resection  Surgeon:                     Angelia Mould. Derrell Lolling, M.D., FACS  Assistant:                      Manus Rudd,  M.D., [facility]  Operative Indications:   This is a reasonably healthy 71 year old woman who had an appendectomy as a child but otherwise has not had any prior surgery. She presented with a due date two-day history of increasing abdominal pain, abdominal distention, nausea and vomiting and no bowel movement for 2 days. Her examination she was found to be distended with nonlocalizing abdominal tenderness and some guarding. X-rays suggest a small bowel obstruction. White blood cell count was 21,000. She was brought to the operating room emergently.  Operative Findings:       The patient had a strangulated small bowel obstruction secondary to adhesions. There were omental tethered  down to the right lower quadrant and there was an internal hernia and a short loop of small bowel was hemorrhagic, thickened and ischemic. There was no perforation. The rest of the small bowel from the ligament of Treitz to the ileocecal valve looked normal all other than being somewhat distended. The colon felt normal, was not distended but had loss of hard stool in it.  Procedure in Detail:          Following the induction of general endotracheal anesthesia, intravenous antibiotics were given, and a Foley catheter was inserted. The patient already had a nasogastric tube in place. The abdomen was prepped and draped in a sterile fashion and a surgical time out was performed. A lower midline incision was made from the infraumbilical area down to the suprapubic area. The fascia was incised in the midline the abdominal  cavity entered and explored with findings as described above. We eviscerated the small bowel and examined it on 3 separate occasions from ligament of Treitz to the ileocecal valve. We divided the omental adhesions releasing the obstruction. After about 5 minutes the bowel still did not look very good and was still blackened, hemorrhagic and markedly thickened in a focal area about midway down the small bowel. We chose to resect this area. We resected about 10-12 inches of small bowel. We divided the small bowel proximal and distal to the diseased area with a GIA stapling device. We divided the small bowel mesentery with the LigaSure device. The specimen was removed. Anastomosis was created using a GIA stapling device and the common defect was closed with a TA 60 stapling device. We placed a few extra sutures of 2-0 silk to reinforce the staple lines at critical points. The mesentery was closed with interrupted sutures of 3-0 silk. We irrigated out the abdomen. There was some nonbloody ascites present which was evacuated. We checked the anastomosis and it looked intact with no bleeding and no leak with a pinch test. The small bowel and her very small omentum were returned to anatomic position. Midline fascia  was closed with a running suture of #1, double-stranded PDS and the skin closed with skin staples. A bandage was placed and the patient taken to recovery room in stable condition. There were no complications. EBL 50 cc. Counts correct.     Angelia Mould. Derrell Lolling, M.D., FACS General and Minimally Invasive Surgery Breast and Colorectal Surgery  11/20/2011 11:04 AM

## 2011-11-20 NOTE — Transfer of Care (Signed)
Immediate Anesthesia Transfer of Care Note  Patient: Dana Hall  Procedure(s) Performed: Procedure(s) (LRB): EXPLORATORY LAPAROTOMY (N/A)  Patient Location: PACU  Anesthesia Type: General  Level of Consciousness: awake and sedated  Airway & Oxygen Therapy: Patient Spontanous Breathing and Patient connected to face mask oxygen  Post-op Assessment: Report given to PACU RN and Post -op Vital signs reviewed and stable  Post vital signs: Reviewed and stable  Complications: No apparent anesthesia complications

## 2011-11-20 NOTE — Progress Notes (Signed)
Subjective: I have reviewed the patient's history and physical findings. I reviewed her lab work, x-rays, and Dr. Cliffton Asters feels history and physical.  She is still having intermittent spasms of abdominal pain. WBC still elevated.Wanda Plump 21,000.  Abdominal x-rays still show pattern of the small bowel junction.  Objective: Vital signs in last 24 hours: Temp:  [98.6 F (37 C)-98.9 F (37.2 C)] 98.6 F (37 C) (08/08 0230) Pulse Rate:  [75-101] 94  (08/08 0606) Resp:  [15-18] 15  (08/08 0606) BP: (137-179)/(56-97) 137/79 mmHg (08/08 0606) SpO2:  [96 %-100 %] 98 % (08/08 0606) Weight:  [132 lb (59.875 kg)] 132 lb (59.875 kg) (08/08 0459)    Intake/Output from previous day: 08/07 0701 - 08/08 0700 In: 1310.4 [I.V.:1310.4] Out: 300 [Urine:300] Intake/Output this shift: Total I/O In: 1310.4 [I.V.:1310.4] Out: 300 [Urine:300]  General appearance: alert. Present. Moderate distress. No status normal. Resp: clear to auscultation bilaterally GI: distended, diffusely tender with diffuse guarding. Not rigid. Solid. Right lower quadrant scar. No hernia. No mass.  Lab Results:  Results for orders placed during the hospital encounter of 11/19/11 (from the past 24 hour(s))  CBC WITH DIFFERENTIAL     Status: Abnormal   Collection Time   11/19/11  5:55 PM      Component Value Range   WBC 19.0 (*) 4.0 - 10.5 K/uL   RBC 5.35 (*) 3.87 - 5.11 MIL/uL   Hemoglobin 17.0 (*) 12.0 - 15.0 g/dL   HCT 47.8 (*) 29.5 - 62.1 %   MCV 88.6  78.0 - 100.0 fL   MCH 31.8  26.0 - 34.0 pg   MCHC 35.9  30.0 - 36.0 g/dL   RDW 30.8  65.7 - 84.6 %   Platelets 120 (*) 150 - 400 K/uL   Neutrophils Relative 83 (*) 43 - 77 %   Neutro Abs 15.7 (*) 1.7 - 7.7 K/uL   Lymphocytes Relative 8 (*) 12 - 46 %   Lymphs Abs 1.6  0.7 - 4.0 K/uL   Monocytes Relative 9  3 - 12 %   Monocytes Absolute 1.7 (*) 0.1 - 1.0 K/uL   Eosinophils Relative 0  0 - 5 %   Eosinophils Absolute 0.0  0.0 - 0.7 K/uL   Basophils Relative 0   0 - 1 %   Basophils Absolute 0.0  0.0 - 0.1 K/uL   Smear Review SPECIMEN CHECKED FOR CLOTS    LIPASE, BLOOD     Status: Normal   Collection Time   11/19/11  5:55 PM      Component Value Range   Lipase 48  11 - 59 U/L  LACTIC ACID, PLASMA     Status: Normal   Collection Time   11/19/11  6:55 PM      Component Value Range   Lactic Acid, Venous 1.7  0.5 - 2.2 mmol/L  COMPREHENSIVE METABOLIC PANEL     Status: Abnormal   Collection Time   11/19/11  8:20 PM      Component Value Range   Sodium 130 (*) 135 - 145 mEq/L   Potassium 4.0  3.5 - 5.1 mEq/L   Chloride 90 (*) 96 - 112 mEq/L   CO2 29  19 - 32 mEq/L   Glucose, Bld 115 (*) 70 - 99 mg/dL   BUN 15  6 - 23 mg/dL   Creatinine, Ser 9.62  0.50 - 1.10 mg/dL   Calcium 9.8  8.4 - 95.2 mg/dL   Total Protein 7.2  6.0 -  8.3 g/dL   Albumin 3.8  3.5 - 5.2 g/dL   AST 20  0 - 37 U/L   ALT 19  0 - 35 U/L   Alkaline Phosphatase 59  39 - 117 U/L   Total Bilirubin 1.2  0.3 - 1.2 mg/dL   GFR calc non Af Amer 89 (*) >90 mL/min   GFR calc Af Amer >90  >90 mL/min  URINALYSIS, ROUTINE W REFLEX MICROSCOPIC     Status: Abnormal   Collection Time   11/19/11  8:34 PM      Component Value Range   Color, Urine AMBER (*) YELLOW   APPearance CLEAR  CLEAR   Specific Gravity, Urine 1.037 (*) 1.005 - 1.030   pH 6.0  5.0 - 8.0   Glucose, UA NEGATIVE  NEGATIVE mg/dL   Hgb urine dipstick NEGATIVE  NEGATIVE   Bilirubin Urine NEGATIVE  NEGATIVE   Ketones, ur NEGATIVE  NEGATIVE mg/dL   Protein, ur 30 (*) NEGATIVE mg/dL   Urobilinogen, UA 0.2  0.0 - 1.0 mg/dL   Nitrite NEGATIVE  NEGATIVE   Leukocytes, UA NEGATIVE  NEGATIVE  URINE MICROSCOPIC-ADD ON     Status: Normal   Collection Time   11/19/11  8:34 PM      Component Value Range   Squamous Epithelial / LPF RARE  RARE   WBC, UA 0-2  <3 WBC/hpf   Bacteria, UA RARE  RARE   Urine-Other MUCOUS PRESENT    BASIC METABOLIC PANEL     Status: Abnormal   Collection Time   11/20/11  4:39 AM      Component Value Range    Sodium 131 (*) 135 - 145 mEq/L   Potassium 4.3  3.5 - 5.1 mEq/L   Chloride 97  96 - 112 mEq/L   CO2 23  19 - 32 mEq/L   Glucose, Bld 151 (*) 70 - 99 mg/dL   BUN 15  6 - 23 mg/dL   Creatinine, Ser 1.61  0.50 - 1.10 mg/dL   Calcium 9.1  8.4 - 09.6 mg/dL   GFR calc non Af Amer 88 (*) >90 mL/min   GFR calc Af Amer >90  >90 mL/min  CBC     Status: Abnormal   Collection Time   11/20/11  4:39 AM      Component Value Range   WBC 21.2 (*) 4.0 - 10.5 K/uL   RBC 5.48 (*) 3.87 - 5.11 MIL/uL   Hemoglobin 17.2 (*) 12.0 - 15.0 g/dL   HCT 04.5 (*) 40.9 - 81.1 %   MCV 90.3  78.0 - 100.0 fL   MCH 31.4  26.0 - 34.0 pg   MCHC 34.7  30.0 - 36.0 g/dL   RDW 91.4  78.2 - 95.6 %   Platelets 338  150 - 400 K/uL     Studies/Results: @[id number] @     . enoxaparin  40 mg Subcutaneous Q24H  .  HYDROmorphone (DILAUDID) injection  0.5 mg Intravenous Once  . lidocaine      . lidocaine      .  morphine injection  4 mg Intravenous Once  . ondansetron (ZOFRAN) IV  4 mg Intravenous Once  . ondansetron (ZOFRAN) IV  4 mg Intravenous Once  . pantoprazole (PROTONIX) IV  40 mg Intravenous QHS  . sodium chloride  1,000 mL Intravenous Once  . DISCONTD: sodium chloride  500 mL Intravenous Once     Assessment/Plan:  Acute mechanical small bowel obstruction with concern for ischemia or  infarction.  Proceed to OR for court-ordered laparotomy this morning.  I have discussed indications, details, techniques, and numerous risks of the surgery with the patient. She is aware of possible bowel resection and ostomy.Her questions were answered. She understands these issues. She agrees with this plan.    LOS: 1 day    Jshaun Abernathy M. Derrell Lolling, M.D., [facility] General and Minimally invasive Surgery Breast and Colorectal Surgery Office:   [phone] Pager:   [phone]  11/20/2011  . .prob

## 2011-11-20 NOTE — ED Notes (Signed)
Attempted to give report. Darl Pikes, Receiving RN busy. Gave extension, Diplomatic Services operational officer confirmed she will call back.

## 2011-11-20 NOTE — Anesthesia Postprocedure Evaluation (Signed)
  Anesthesia Post-op Note  Patient: Dana Hall  Procedure(s) Performed: Procedure(s) (LRB): EXPLORATORY LAPAROTOMY (N/A)  Patient Location: PACU  Anesthesia Type: General  Level of Consciousness: awake and alert   Airway and Oxygen Therapy: Patient Spontanous Breathing  Post-op Pain: mild  Post-op Assessment: Post-op Vital signs reviewed, Patient's Cardiovascular Status Stable, Respiratory Function Stable, Patent Airway and No signs of Nausea or vomiting  Post-op Vital Signs: stable  Complications: No apparent anesthesia complications. Urine output 200 cc in PACU.

## 2011-11-20 NOTE — Anesthesia Preprocedure Evaluation (Addendum)
Anesthesia Evaluation  Patient identified by MRN, date of birth, ID band Patient awake    Reviewed: Allergy & Precautions, H&P , NPO status , Patient's Chart, lab work & pertinent test results  Airway Mallampati: III TM Distance: >3 FB Neck ROM: Full  Mouth opening: Limited Mouth Opening  Dental No notable dental hx.    Pulmonary neg pulmonary ROS,  breath sounds clear to auscultation  Pulmonary exam normal       Cardiovascular hypertension, Pt. on medications negative cardio ROS  Rhythm:Regular Rate:Normal     Neuro/Psych negative neurological ROS  negative psych ROS   GI/Hepatic negative GI ROS, Neg liver ROS,   Endo/Other  negative endocrine ROS  Renal/GU negative Renal ROS  negative genitourinary   Musculoskeletal negative musculoskeletal ROS (+)   Abdominal   Peds negative pediatric ROS (+)  Hematology negative hematology ROS (+)   Anesthesia Other Findings   Reproductive/Obstetrics negative OB ROS                          Anesthesia Physical Anesthesia Plan  ASA: II and Emergent  Anesthesia Plan: General   Post-op Pain Management:    Induction: Intravenous  Airway Management Planned:   Additional Equipment:   Intra-op Plan:   Post-operative Plan: Extubation in OR  Informed Consent: I have reviewed the patients History and Physical, chart, labs and discussed the procedure including the risks, benefits and alternatives for the proposed anesthesia with the patient or authorized representative who has indicated his/her understanding and acceptance.   Dental advisory given  Plan Discussed with: CRNA  Anesthesia Plan Comments:         Anesthesia Quick Evaluation

## 2011-11-21 ENCOUNTER — Encounter (HOSPITAL_COMMUNITY): Payer: Self-pay | Admitting: General Surgery

## 2011-11-21 DIAGNOSIS — I10 Essential (primary) hypertension: Secondary | ICD-10-CM

## 2011-11-21 HISTORY — DX: Essential (primary) hypertension: I10

## 2011-11-21 LAB — CBC
HCT: 42.6 % (ref 36.0–46.0)
Hemoglobin: 14.6 g/dL (ref 12.0–15.0)
MCHC: 34.3 g/dL (ref 30.0–36.0)
MCV: 91.6 fL (ref 78.0–100.0)
RDW: 13.5 % (ref 11.5–15.5)
WBC: 13.4 10*3/uL — ABNORMAL HIGH (ref 4.0–10.5)

## 2011-11-21 LAB — BASIC METABOLIC PANEL
BUN: 13 mg/dL (ref 6–23)
Chloride: 102 mEq/L (ref 96–112)
Creatinine, Ser: 0.54 mg/dL (ref 0.50–1.10)
GFR calc Af Amer: >90 mL/min (ref >90)
Glucose, Bld: 101 mg/dL — ABNORMAL HIGH (ref 70–99)

## 2011-11-21 MED ORDER — PHENOL 1.4 % MT LIQD
1.0000 | OROMUCOSAL | Status: DC | PRN
  Filled 2011-11-21: qty 177

## 2011-11-21 MED ORDER — MENTHOL 3 MG MT LOZG
1.0000 | LOZENGE | OROMUCOSAL | Status: DC | PRN
  Filled 2011-11-21: qty 9

## 2011-11-21 MED ORDER — PANTOPRAZOLE SODIUM 40 MG IV SOLR
40.0000 mg | Freq: Every day | INTRAVENOUS | Status: DC
  Administered 2011-11-21 – 2011-11-29 (×9): 40 mg via INTRAVENOUS
  Filled 2011-11-21 (×9): qty 40

## 2011-11-21 NOTE — Progress Notes (Signed)
1 Day Post-Op  Subjective: Alert. Abdominal pain much less compared to preop.afebrile. Vital signs stable. Good urine output.  Hemoglobin 14.6. WBC down to 13.4. Potassium 4.6. Creatinine 0.54.  Again she functioning normally, bilious.  Objective: Vital signs in last 24 hours: Temp:  [97.9 F (36.6 C)-99.2 F (37.3 C)] 99 F (37.2 C) (08/09 0532) Pulse Rate:  [72-113] 85  (08/09 0532) Resp:  [10-18] 16  (08/09 0532) BP: (133-184)/(71-100) 160/82 mmHg (08/09 0532) SpO2:  [93 %-100 %] 100 % (08/09 0532) Last BM Date: 11/18/11  Intake/Output from previous day: 08/08 0701 - 08/09 0700 In: 4200.4 [I.V.:4170.4; NG/GT:30] Out: 1255 [Urine:875; Emesis/NG output:155; Blood:75] Intake/Output this shift: Total I/O In: 1541.7 [I.V.:1541.7] Out: 630 [Urine:600; Emesis/NG output:30]    Exam: General appearance: aalert. Minimal distress. Mental status normal. Skin warm and dry. Resp: clear to auscultation bilaterally GI: abdomen still distended somewhat, but less. Softer. Much less tender. Midline wound okay. Minimal bowel sounds.    Lab Results:  Results for orders placed during the hospital encounter of 11/19/11 (from the past 24 hour(s))  SURGICAL PCR SCREEN     Status: Normal   Collection Time   11/20/11  7:20 AM      Component Value Range   MRSA, PCR NEGATIVE  NEGATIVE   Staphylococcus aureus NEGATIVE  NEGATIVE  CBC     Status: Abnormal   Collection Time   11/21/11  5:20 AM      Component Value Range   WBC 13.4 (*) 4.0 - 10.5 K/uL   RBC 4.65  3.87 - 5.11 MIL/uL   Hemoglobin 14.6  12.0 - 15.0 g/dL   HCT 16.1  09.6 - 04.5 %   MCV 91.6  78.0 - 100.0 fL   MCH 31.4  26.0 - 34.0 pg   MCHC 34.3  30.0 - 36.0 g/dL   RDW 40.9  81.1 - 91.4 %   Platelets 234  150 - 400 K/uL  BASIC METABOLIC PANEL     Status: Abnormal   Collection Time   11/21/11  5:20 AM      Component Value Range   Sodium 133 (*) 135 - 145 mEq/L   Potassium 4.6  3.5 - 5.1 mEq/L   Chloride 102  96 - 112 mEq/L   CO2 24  19 - 32 mEq/L   Glucose, Bld 101 (*) 70 - 99 mg/dL   BUN 13  6 - 23 mg/dL   Creatinine, Ser 7.82  0.50 - 1.10 mg/dL   Calcium 8.0 (*) 8.4 - 10.5 mg/dL   GFR calc non Af Amer >90  >90 mL/min   GFR calc Af Amer >90  >90 mL/min     Studies/Results: @RISRSLT24 @     . antiseptic oral rinse  15 mL Mouth Rinse q12n4p  . cefOXitin  2 g Intravenous 60 min Pre-Op  . cefOXitin  2 g Intravenous Q8H  . chlorhexidine  15 mL Mouth Rinse BID  . heparin  5,000 Units Subcutaneous Q8H  . HYDROmorphone PCA 0.3 mg/mL   Intravenous Q4H  . HYDROmorphone PCA 0.3 mg/mL      . metoprolol  5 mg Intravenous Q6H  . DISCONTD: enoxaparin  40 mg Subcutaneous Q24H  . DISCONTD: HYDROmorphone PCA 0.3 mg/mL   Intravenous Q4H  . DISCONTD: pantoprazole (PROTONIX) IV  40 mg Intravenous QHS     Assessment/Plan: s/p Procedure(s): EXPLORATORY LAPAROTOMY   POD #1. Laparotomy, lysis of adhesions, small bowel resection for strangulated SBO. Stable Continue NG tube X line  remove the Foley catheter tomorrow. Mobilize out of bed.  Hypertension. On IV Lopressor for now.      LOS: 2 days    Anely Spiewak M. Derrell Lolling, M.D., [facility] General and Minimally invasive Surgery Breast and Colorectal Surgery Office:   [phone] Pager:   [phone]  11/21/2011  . .prob

## 2011-11-22 LAB — BASIC METABOLIC PANEL
Chloride: 100 mEq/L (ref 96–112)
Creatinine, Ser: 0.52 mg/dL (ref 0.50–1.10)
GFR calc Af Amer: >90 mL/min (ref >90)
Potassium: 4 mEq/L (ref 3.5–5.1)

## 2011-11-22 LAB — CBC
Platelets: 235 10*3/uL (ref 150–400)
RDW: 13.1 % (ref 11.5–15.5)
WBC: 8.5 10*3/uL (ref 4.0–10.5)

## 2011-11-22 MED ORDER — LIP MEDEX EX OINT
TOPICAL_OINTMENT | CUTANEOUS | Status: AC
  Administered 2011-11-22: 09:00:00
  Filled 2011-11-22: qty 7

## 2011-11-22 NOTE — Progress Notes (Signed)
Patient ID: Dana Hall, female   DOB: 1940/07/09, 71 y.o.   MRN: [id number]  General Surgery - [facility] - Progress Note  POD# 2  Subjective: Patient comfortable.  Has not been ambulatory yet.  Complains of throat pain only.  Objective: Vital signs in last 24 hours: Temp:  [97.8 F (36.6 C)-98.7 F (37.1 C)] 98.7 F (37.1 C) (08/10 0609) Pulse Rate:  [81-90] 84  (08/10 0609) Resp:  [16-21] 17  (08/10 0801) BP: (144-158)/(71-85) 157/81 mmHg (08/10 0609) SpO2:  [98 %-100 %] 100 % (08/10 0801) Last BM Date: 11/18/11  Intake/Output from previous day: 08/09 0701 - 08/10 0700 In: 3007.1 [I.V.:2977.1; NG/GT:30] Out: 1625 [Urine:1625]  Exam: HEENT - clear, not icteric Neck - soft Chest - clear bilaterally Cor - RRR, no murmur Abd - slight distension, BS present; wound clear and dry - dressing changed Ext - no significant edema Neuro - grossly intact, no focal deficits  Lab Results:   Basename 11/22/11 0524 11/21/11 0520  WBC 8.5 13.4*  HGB 12.8 14.6  HCT 38.4 42.6  PLT 235 234     Basename 11/22/11 0524 11/21/11 0520  NA 131* 133*  K 4.0 4.6  CL 100 102  CO2 23 24  GLUCOSE 85 101*  BUN 11 13  CREATININE 0.52 0.54  CALCIUM 7.8* 8.0*    Studies/Results: No results found.  Assessment / Plan: 1.  Status post ex lap with small bowel resection for obstruction  - continue NG decompression, IV hydration  - OOB, ambulate in halls  - dry dressing to wound  Velora Heckler, MD, [facility] Office: [phone]  11/22/2011

## 2011-11-23 NOTE — Progress Notes (Signed)
Patient ID: Dana Hall, female   DOB: Jul 06, 1940, 71 y.o.   MRN: [id number]  General Surgery - [facility] - Progress Note  POD# 3  Subjective: Patient with more epigastric pain.  No flatus, no BM.  Unable to fully void, Foley placed last PM.  Objective: Vital signs in last 24 hours: Temp:  [98.4 F (36.9 C)-99.4 F (37.4 C)] 98.4 F (36.9 C) (08/11 0532) Pulse Rate:  [80-98] 80  (08/11 0540) Resp:  [16-20] 18  (08/11 0821) BP: (109-152)/(64-91) 152/80 mmHg (08/11 0540) SpO2:  [95 %-99 %] 99 % (08/11 0821) Last BM Date: 11/18/11  Intake/Output from previous day: 08/10 0701 - 08/11 0700 In: 2987.5 [I.V.:2937.5; NG/GT:50] Out: 1450 [Urine:1400; Emesis/NG output:50]  Exam: HEENT - clear, not icteric Neck - soft Chest - clear bilaterally Cor - RRR, no murmur Abd - moderate distension; quiet; mild tenderness epigastrium; wound clear and dry Ext - no significant edema Neuro - grossly intact, no focal deficits  Lab Results:   Basename 11/22/11 0524 11/21/11 0520  WBC 8.5 13.4*  HGB 12.8 14.6  HCT 38.4 42.6  PLT 235 234     Basename 11/22/11 0524 11/21/11 0520  NA 131* 133*  K 4.0 4.6  CL 100 102  CO2 23 24  GLUCOSE 85 101*  BUN 11 13  CREATININE 0.52 0.54  CALCIUM 7.8* 8.0*    Studies/Results: No results found.  Assessment / Plan: 1.  Status post ex lap with LOA for SBO  - continue NG decompression, NPO, IV hydration  - Foley in place  - OOB, ambulate  - check labs in AM 8/12 Discussed with patient and family at bedside.  Velora Heckler, MD, [facility] Office: [phone]  11/23/2011

## 2011-11-23 NOTE — Progress Notes (Signed)
Pt with complaints of urinary retention after voiding 200 cc.  Patient bladder scanned for 475 cc.  MD on call paged for further orders.  Await call from Dr.Rosenbower. Orders received

## 2011-11-24 LAB — BASIC METABOLIC PANEL
BUN: 9 mg/dL (ref 6–23)
Chloride: 99 mEq/L (ref 96–112)
GFR calc Af Amer: >90 mL/min (ref >90)
Potassium: 4.1 mEq/L (ref 3.5–5.1)

## 2011-11-24 LAB — CBC
HCT: 36.4 % (ref 36.0–46.0)
Hemoglobin: 12.5 g/dL (ref 12.0–15.0)
RDW: 12.8 % (ref 11.5–15.5)
WBC: 7.6 10*3/uL (ref 4.0–10.5)

## 2011-11-24 NOTE — Progress Notes (Signed)
Just passed a little gas. Abdomen soft still a little distended no worrisome tenderness. Bowel function may be returning.  Agree with plans per KO,PA

## 2011-11-24 NOTE — Progress Notes (Signed)
Patient ID: Dana Hall, female   DOB: 1940/09/10, 71 y.o.   MRN: [id number] 4 Days Post-Op  Subjective: Pt feels ok, but tired.  No flatus.  Objective: Vital signs in last 24 hours: Temp:  [97.9 F (36.6 C)-98.1 F (36.7 C)] 98.1 F (36.7 C) (08/12 0555) Pulse Rate:  [73-84] 82  (08/12 0555) Resp:  [14-19] 19  (08/12 0835) BP: (147-173)/(71-77) 161/71 mmHg (08/12 0600) SpO2:  [92 %-97 %] 96 % (08/12 0835) Last BM Date: 11/18/11  Intake/Output from previous day: 08/11 0701 - 08/12 0700 In: 2989.8 [I.V.:2918.8; NG/GT:70] Out: 1600 [Urine:1450; Emesis/NG output:150] Intake/Output this shift: Total I/O In: 587.5 [I.V.:587.5] Out: -   PE: Abd: soft, still distended, -BS, mildly tender, incision c/d/i with minimal bloody drainage on gauze. GU: foley in place  Lab Results:   Basename 11/24/11 0409 11/22/11 0524  WBC 7.6 8.5  HGB 12.5 12.8  HCT 36.4 38.4  PLT 253 235   BMET  Basename 11/24/11 0409 11/22/11 0524  NA 131* 131*  K 4.1 4.0  CL 99 100  CO2 20 23  GLUCOSE 76 85  BUN 9 11  CREATININE 0.52 0.52  CALCIUM 7.8* 7.8*   PT/INR No results found for this basename: LABPROT:2,INR:2 in the last 72 hours CMP     Component Value Date/Time   NA 131* 11/24/2011 0409   K 4.1 11/24/2011 0409   CL 99 11/24/2011 0409   CO2 20 11/24/2011 0409   GLUCOSE 76 11/24/2011 0409   BUN 9 11/24/2011 0409   CREATININE 0.52 11/24/2011 0409   CALCIUM 7.8* 11/24/2011 0409   PROT 7.2 11/19/2011 2020   ALBUMIN 3.8 11/19/2011 2020   AST 20 11/19/2011 2020   ALT 19 11/19/2011 2020   ALKPHOS 59 11/19/2011 2020   BILITOT 1.2 11/19/2011 2020   GFRNONAA >90 11/24/2011 0409   GFRAA >90 11/24/2011 0409   Lipase     Component Value Date/Time   LIPASE 48 11/19/2011 1755       Studies/Results: No results found.  Anti-infectives: Anti-infectives     Start     Dose/Rate Route Frequency Ordered Stop   11/20/11 1800   cefOXitin (MEFOXIN) 2 g in dextrose 5 % 50 mL IVPB        2 g 100 mL/hr over  30 Minutes Intravenous 3 times per day 11/20/11 1338 11/21/11 1437   11/20/11 0711   cefOXitin (MEFOXIN) 2 g in dextrose 5 % 50 mL IVPB        2 g 100 mL/hr over 30 Minutes Intravenous 60 min pre-op 11/20/11 0711 11/20/11 1010           Assessment/Plan  1. S/p ex lap with SBR 2. Post op ileus 3. Urinary rentention 4. HTN  Plan: 1. Cont NPO and NGT due to ileus 2. Will try voiding trial within the next day or so. 3. Cont mobilization 4. If no return of bowel function by tomorrow, will likely place a PICC and start TNA.   LOS: 5 days    Ariyon Gerstenberger E 11/24/2011

## 2011-11-25 NOTE — Progress Notes (Signed)
Patient ID: Dana Hall, female   DOB: [birth year], 71 y.o.   MRN: [id number] 5 Days Post-Op  Subjective: Pt feels well.  Able to void on her own.  Some flatus, but minimal  Objective: Vital signs in last 24 hours: Temp:  [98.3 F (36.8 C)-98.8 F (37.1 C)] 98.8 F (37.1 C) (08/13 0534) Pulse Rate:  [72-80] 77  (08/13 0534) Resp:  [12-18] 18  (08/13 0819) BP: (155-175)/(72-90) 172/90 mmHg (08/13 0534) SpO2:  [90 %-100 %] 97 % (08/13 0534) Last BM Date: 11/18/11  Intake/Output from previous day: 08/12 0701 - 08/13 0700 In: 3245 [P.O.:120; I.V.:3125] Out: 3600 [Urine:3500; Emesis/NG output:100] Intake/Output this shift:    PE: Abd: soft, still mildly distended, hypoactive BS, mild appropriate tenderness.  NGT with minimal output.  Lab Results:   [facility] 11/24/11 0409  WBC 7.6  HGB 12.5  HCT 36.4  PLT 253   BMET  Basename 11/24/11 0409  NA 131*  K 4.1  CL 99  CO2 20  GLUCOSE 76  BUN 9  CREATININE 0.52  CALCIUM 7.8*   PT/INR No results found for this basename: LABPROT:2,INR:2 in the last 72 hours CMP     Component Value Date/Time   NA 131* 11/24/2011 0409   K 4.1 11/24/2011 0409   CL 99 11/24/2011 0409   CO2 20 11/24/2011 0409   GLUCOSE 76 11/24/2011 0409   BUN 9 11/24/2011 0409   CREATININE 0.52 11/24/2011 0409   CALCIUM 7.8* 11/24/2011 0409   PROT 7.2 11/19/2011 2020   ALBUMIN 3.8 11/19/2011 2020   AST 20 11/19/2011 2020   ALT 19 11/19/2011 2020   ALKPHOS 59 11/19/2011 2020   BILITOT 1.2 11/19/2011 2020   GFRNONAA >90 11/24/2011 0409   GFRAA >90 11/24/2011 0409   Lipase     Component Value Date/Time   LIPASE 48 11/19/2011 1755       Studies/Results: No results found.  Anti-infectives: Anti-infectives     Start     Dose/Rate Route Frequency Ordered Stop   11/20/11 1800   cefOXitin (MEFOXIN) 2 g in dextrose 5 % 50 mL IVPB        2 g 100 mL/hr over 30 Minutes Intravenous 3 times per day 11/20/11 1338 11/21/11 1437   11/20/11 0711   cefOXitin (MEFOXIN) 2  g in dextrose 5 % 50 mL IVPB        2 g 100 mL/hr over 30 Minutes Intravenous 60 min pre-op 11/20/11 0711 11/20/11 1010           Assessment/Plan  1. S/p ex lap with SBR 2. Post-op ileus  Plan: 1. Dc NGT and keep NPO 2. Cont ambulation 3. Hopefully can start feeding tomorrow 4. Cont pulm toilet   LOS: 6 days    Bryahna Lesko E 11/25/2011

## 2011-11-25 NOTE — Progress Notes (Signed)
Doing well, feels good, passed a little more gas.  Agree with A&P of KO,PA

## 2011-11-26 MED ORDER — HYDROMORPHONE HCL PF 1 MG/ML IJ SOLN
0.5000 mg | INTRAMUSCULAR | Status: DC | PRN
  Administered 2011-11-26 – 2011-11-28 (×4): 1 mg via INTRAVENOUS
  Filled 2011-11-26 (×4): qty 1

## 2011-11-26 NOTE — Plan of Care (Signed)
Problem: Phase II Progression Outcomes Goal: Pain controlled Outcome: Completed/Met Date Met:  11/26/11 Pain controlled with minimal amounts of analgesia. Goal: Discharge plan established Outcome: Completed/Met Date Met:  11/26/11 Plans to return home with family.

## 2011-11-26 NOTE — Progress Notes (Signed)
Patient ID: Dana Hall, female   DOB: 08-03-1940, 71 y.o.   MRN: [id number] 6 Days Post-Op  Subjective: Pt feels well.  Some flatus, but still not much.  No nausea with NGT out.  Objective: Vital signs in last 24 hours: Temp:  [97.5 F (36.4 C)-99.3 F (37.4 C)] 98.2 F (36.8 C) (08/14 0600) Pulse Rate:  [72-85] 72  (08/14 0600) Resp:  [16-18] 16  (08/14 0743) BP: (146-181)/(49-84) 156/83 mmHg (08/14 0600) SpO2:  [96 %-100 %] 97 % (08/14 0743) Last BM Date: 11/18/11  Intake/Output from previous day: 08/13 0701 - 08/14 0700 In: 2958.3 [I.V.:2958.3] Out: 4600 [Urine:4600] Intake/Output this shift: Total I/O In: 500 [I.V.:500] Out: 450 [Urine:450]  PE: Abd: soft, mildly tender, hypoactive BS, still with some mild distention.  Incision c/d/i with staples  Lab Results:   Basename 11/24/11 0409  WBC 7.6  HGB 12.5  HCT 36.4  PLT 253   BMET  Basename 11/24/11 0409  NA 131*  K 4.1  CL 99  CO2 20  GLUCOSE 76  BUN 9  CREATININE 0.52  CALCIUM 7.8*   PT/INR No results found for this basename: LABPROT:2,INR:2 in the last 72 hours CMP     Component Value Date/Time   NA 131* 11/24/2011 0409   K 4.1 11/24/2011 0409   CL 99 11/24/2011 0409   CO2 20 11/24/2011 0409   GLUCOSE 76 11/24/2011 0409   BUN 9 11/24/2011 0409   CREATININE 0.52 11/24/2011 0409   CALCIUM 7.8* 11/24/2011 0409   PROT 7.2 11/19/2011 2020   ALBUMIN 3.8 11/19/2011 2020   AST 20 11/19/2011 2020   ALT 19 11/19/2011 2020   ALKPHOS 59 11/19/2011 2020   BILITOT 1.2 11/19/2011 2020   GFRNONAA >90 11/24/2011 0409   GFRAA >90 11/24/2011 0409   Lipase     Component Value Date/Time   LIPASE 48 11/19/2011 1755       Studies/Results: No results found.  Anti-infectives: Anti-infectives     Start     Dose/Rate Route Frequency Ordered Stop   11/20/11 1800   cefOXitin (MEFOXIN) 2 g in dextrose 5 % 50 mL IVPB        2 g 100 mL/hr over 30 Minutes Intravenous 3 times per day 11/20/11 1338 11/21/11 1437   11/20/11  0711   cefOXitin (MEFOXIN) 2 g in dextrose 5 % 50 mL IVPB        2 g 100 mL/hr over 30 Minutes Intravenous 60 min pre-op 11/20/11 0711 11/20/11 1010           Assessment/Plan  1. S/p SBR 2. Post op ileus  Plan: 1. Allow sips of clear liquids today 2. Dc PCA since she is not using it and add prn dilaudid.   LOS: 7 days    Vannie Hilgert E 11/26/2011

## 2011-11-26 NOTE — Progress Notes (Signed)
Abd soft, few BS. Agree with A&P of KO,PA

## 2011-11-27 NOTE — Progress Notes (Signed)
INITIAL ADULT NUTRITION ASSESSMENT Date: 11/27/2011   Time: 2:03 PM Reason for Assessment: NPO/clear liquid x 7 days   INTERVENTION: Diet advancement per MD. Will monitor.   ASSESSMENT: Female 71 y.o.  Dx: Strangulation of small intestine  Food/Nutrition Related Hx: Pt reports eating well PTA, 3 meals/day with good appetite and stable weight. Pt admitted with severe abdominal pain and nausea and was found to have SBO. POD# 7 exploratory laparotomy with lysis of adhesions and small bowel resection. Pt had NGT from 8/8-8/13. Pt started on clear liquid diet today and reports tolerating it well without any nausea. Pt with BM yesterday, none today.   Hx:  Past Medical History  Diagnosis Date  . Hypertension   . Hypertension 11/21/2011   Related Meds:  Scheduled Meds:   . heparin  5,000 Units Subcutaneous Q8H  . metoprolol  5 mg Intravenous Q6H  . pantoprazole (PROTONIX) IV  40 mg Intravenous Daily  . DISCONTD: antiseptic oral rinse  15 mL Mouth Rinse q12n4p  . DISCONTD: chlorhexidine  15 mL Mouth Rinse BID   Continuous Infusions:   . 0.9 % NaCl with KCl 20 mEq / L 50 mL/hr at 11/27/11 0831   PRN Meds:.diphenhydrAMINE, diphenhydrAMINE, HYDROmorphone (DILAUDID) injection, menthol-cetylpyridinium, naloxone, ondansetron (ZOFRAN) IV, ondansetron, phenol, sodium chloride  Ht: 5' 7.5" (171.5 cm)  Wt: 132 lb (59.875 kg)  Ideal Wt: 135 lb % Ideal Wt: 97  Usual Wt: 130 lb % Usual Wt: 101  Body mass index is 20.37 kg/(m^2).   Labs:  CMP     Component Value Date/Time   NA 131* 11/24/2011 0409   K 4.1 11/24/2011 0409   CL 99 11/24/2011 0409   CO2 20 11/24/2011 0409   GLUCOSE 76 11/24/2011 0409   BUN 9 11/24/2011 0409   CREATININE 0.52 11/24/2011 0409   CALCIUM 7.8* 11/24/2011 0409   PROT 7.2 11/19/2011 2020   ALBUMIN 3.8 11/19/2011 2020   AST 20 11/19/2011 2020   ALT 19 11/19/2011 2020   ALKPHOS 59 11/19/2011 2020   BILITOT 1.2 11/19/2011 2020   GFRNONAA >90 11/24/2011 0409   GFRAA >90  11/24/2011 0409    Intake/Output Summary (Last 24 hours) at 11/27/11 1408 Last data filed at 11/27/11 1000  Gross per 24 hour  Intake 610.42 ml  Output   4600 ml  Net -3989.58 ml   Last BM - 8/14  Diet Order: Clear Liquid   IVF:    0.9 % NaCl with KCl 20 mEq / L Last Rate: 50 mL/hr at 11/27/11 0831    Estimated Nutritional Needs:   Kcal:1700-1900 Protein:90-105g Fluid:1.7-1.9L  NUTRITION DIAGNOSIS: -Inadequate oral intake (NI-2.1).  Status: Ongoing  RELATED TO: post-op ileus  AS EVIDENCE BY: clear liquid diet   MONITORING/EVALUATION(Goals): Advance diet as tolerated to low fiber diet.   EDUCATION NEEDS: -No education needs identified at this time  Dietitian #: [phone]  DOCUMENTATION CODES Per approved criteria  -Not Applicable    Marshall Cork 11/27/2011, 2:03 PM

## 2011-11-27 NOTE — Progress Notes (Signed)
Patient ID: Dana Hall, female   DOB: 04-Sep-1940, 71 y.o.   MRN: [id number] 7 Days Post-Op  Subjective: Pt feels well.  Had small BM yesterday.  Tolerating sips of clears with no nausea.  Belly feels less bloated.  Objective: Vital signs in last 24 hours: Temp:  [97.8 F (36.6 C)-98.3 F (36.8 C)] 97.8 F (36.6 C) (08/15 0559) Pulse Rate:  [68-78] 78  (08/15 0559) Resp:  [16-18] 18  (08/15 0559) BP: (162-181)/(79-84) 165/84 mmHg (08/15 0559) SpO2:  [91 %-99 %] 96 % (08/15 0559) Last BM Date: 11/26/11  Intake/Output from previous day: 08/14 0701 - 08/15 0700 In: 1666.7 [I.V.:1666.7] Out: 4950 [Urine:3150; Emesis/NG output:1800] Intake/Output this shift:    PE: Abd: soft, much less distended, +BS, minimally tender, incision c/d/i  Lab Results:  No results found for this basename: WBC:2,HGB:2,HCT:2,PLT:2 in the last 72 hours BMET No results found for this basename: NA:2,K:2,CL:2,CO2:2,GLUCOSE:2,BUN:2,CREATININE:2,CALCIUM:2 in the last 72 hours PT/INR No results found for this basename: LABPROT:2,INR:2 in the last 72 hours CMP     Component Value Date/Time   NA 131* 11/24/2011 0409   K 4.1 11/24/2011 0409   CL 99 11/24/2011 0409   CO2 20 11/24/2011 0409   GLUCOSE 76 11/24/2011 0409   BUN 9 11/24/2011 0409   CREATININE 0.52 11/24/2011 0409   CALCIUM 7.8* 11/24/2011 0409   PROT 7.2 11/19/2011 2020   ALBUMIN 3.8 11/19/2011 2020   AST 20 11/19/2011 2020   ALT 19 11/19/2011 2020   ALKPHOS 59 11/19/2011 2020   BILITOT 1.2 11/19/2011 2020   GFRNONAA >90 11/24/2011 0409   GFRAA >90 11/24/2011 0409   Lipase     Component Value Date/Time   LIPASE 48 11/19/2011 1755       Studies/Results: No results found.  Anti-infectives: Anti-infectives     Start     Dose/Rate Route Frequency Ordered Stop   11/20/11 1800   cefOXitin (MEFOXIN) 2 g in dextrose 5 % 50 mL IVPB        2 g 100 mL/hr over 30 Minutes Intravenous 3 times per day 11/20/11 1338 11/21/11 1437   11/20/11 0711   cefOXitin  (MEFOXIN) 2 g in dextrose 5 % 50 mL IVPB        2 g 100 mL/hr over 30 Minutes Intravenous 60 min pre-op 11/20/11 0711 11/20/11 1010           Assessment/Plan  1. S/p ex lap with SBR 2. Post op ileus, resolving  Plan: 1. Adv to clear liquids 2. May shower 3. Decrease IVFs   LOS: 8 days    Toryn Mcclinton E 11/27/2011

## 2011-11-27 NOTE — Progress Notes (Signed)
Improving daily. Had BM, on exam abd softer and less distended, wound ok. Agree with A&P of KO, PA

## 2011-11-28 MED ORDER — OXYCODONE-ACETAMINOPHEN 5-325 MG PO TABS: 1.0000 | ORAL_TABLET | ORAL | Status: DC | PRN

## 2011-11-28 NOTE — Progress Notes (Signed)
Patient ID: Dana Hall, female   DOB: [birth year], 71 y.o.   MRN: [id number] 8 Days Post-Op  Subjective: Pt feels well.  Tolerating clear liquids without any problems.  Objective: Vital signs in last 24 hours: Temp:  [97.8 F (36.6 C)-98.4 F (36.9 C)] 98 F (36.7 C) (08/16 0600) Pulse Rate:  [70-76] 70  (08/16 0600) Resp:  [17-18] 17  (08/16 0600) BP: (136-166)/(71-81) 166/71 mmHg (08/16 0600) SpO2:  [98 %-99 %] 99 % (08/16 0600) Last BM Date: 11/26/11  Intake/Output from previous day: 08/15 0701 - 08/16 0700 In: 520 [P.O.:120; I.V.:400] Out: 1650 [Urine:1650] Intake/Output this shift:    PE: Abd: soft, maybe a touch more distended, but + BS, incision c/d/i with staples  Lab Results:  No results found for this basename: WBC:2,HGB:2,HCT:2,PLT:2 in the last 72 hours BMET No results found for this basename: NA:2,K:2,CL:2,CO2:2,GLUCOSE:2,BUN:2,CREATININE:2,CALCIUM:2 in the last 72 hours PT/INR No results found for this basename: LABPROT:2,INR:2 in the last 72 hours CMP     Component Value Date/Time   NA 131* 11/24/2011 0409   K 4.1 11/24/2011 0409   CL 99 11/24/2011 0409   CO2 20 11/24/2011 0409   GLUCOSE 76 11/24/2011 0409   BUN 9 11/24/2011 0409   CREATININE 0.52 11/24/2011 0409   CALCIUM 7.8* 11/24/2011 0409   PROT 7.2 11/19/2011 2020   ALBUMIN 3.8 11/19/2011 2020   AST 20 11/19/2011 2020   ALT 19 11/19/2011 2020   ALKPHOS 59 11/19/2011 2020   BILITOT 1.2 11/19/2011 2020   GFRNONAA >90 11/24/2011 0409   GFRAA >90 11/24/2011 0409   Lipase     Component Value Date/Time   LIPASE 48 11/19/2011 1755       Studies/Results: No results found.  Anti-infectives: Anti-infectives     Start     Dose/Rate Route Frequency Ordered Stop   11/20/11 1800   cefOXitin (MEFOXIN) 2 g in dextrose 5 % 50 mL IVPB        2 g 100 mL/hr over 30 Minutes Intravenous 3 times per day 11/20/11 1338 11/21/11 1437   11/20/11 0711   cefOXitin (MEFOXIN) 2 g in dextrose 5 % 50 mL IVPB        2 g 100  mL/hr over 30 Minutes Intravenous 60 min pre-op 11/20/11 0711 11/20/11 1010           Assessment/Plan  1. S/p ex lap for SBO with SBR  Plan: 1. Advance to full liquids 2. Dc IVFs 3. Add PO pain medicine   LOS: 9 days    Tierre Netto E 11/28/2011

## 2011-11-28 NOTE — Progress Notes (Signed)
See note per KO,PA. She continues to improve and likely home tomorrow

## 2011-11-28 NOTE — Progress Notes (Addendum)
Pt abdominal dressing changed. Dressing was dry and clean. Pt tolerated well. Staples in place. Small amount of bruising around surgical site. Cleaned with alcohol pads.  MCCLAIN, Liane Tribbey L 11/28/2011 11:58 AM

## 2011-11-29 ENCOUNTER — Encounter (HOSPITAL_COMMUNITY): Payer: Self-pay | Admitting: Surgery

## 2011-11-29 DIAGNOSIS — K56609 Unspecified intestinal obstruction, unspecified as to partial versus complete obstruction: Secondary | ICD-10-CM

## 2011-11-29 HISTORY — DX: Unspecified intestinal obstruction, unspecified as to partial versus complete obstruction: K56.609

## 2011-11-29 MED ORDER — HYDROCODONE-ACETAMINOPHEN 5-325 MG PO TABS: 1.0000 | ORAL_TABLET | Freq: Four times a day (QID) | ORAL | Status: AC | PRN

## 2011-11-29 MED ORDER — LISINOPRIL 5 MG PO TABS
5.0000 mg | ORAL_TABLET | Freq: Every day | ORAL | Status: DC
  Filled 2011-11-29: qty 1

## 2011-11-29 MED ORDER — SENNOSIDES-DOCUSATE SODIUM 8.6-50 MG PO TABS
2.0000 | ORAL_TABLET | Freq: Every day | ORAL | Status: DC
  Filled 2011-11-29: qty 2

## 2011-11-29 MED ORDER — SENNA-DOCUSATE SODIUM 8.6-50 MG PO TABS: 2.0000 | ORAL_TABLET | Freq: Every day | ORAL | Status: DC

## 2011-11-29 MED ORDER — SENNA-DOCUSATE SODIUM 8.6-50 MG PO TABS: 1.0000 | ORAL_TABLET | Freq: Two times a day (BID) | ORAL | Status: DC | PRN

## 2011-11-29 NOTE — Progress Notes (Signed)
Pt discharged to home with husband provided discharge instructions and prescriptions along with handouts. Pt verbalized understanding of discharge information. Pt stable. Pt transported by [facility] IV removed and documented. Staples removed and steri strips applied. Annitta Needs, RN

## 2011-11-29 NOTE — Discharge Summary (Signed)
Physician Discharge Summary  Patient ID: Dana Hall MRN: [id number] DOB/AGE: 1940/06/11 71 y.o.  Admit date: 11/19/2011 Discharge date: 11/29/2011  Patient Care Team: Leanor Rubenstein, MD as PCP - General (Family Medicine) Ernestene Mention, MD as Consulting Physician (General Surgery)   Admission Diagnoses: Principal Problem:  *Strangulation of small intestine Active Problems:  Hypertension  SBO (small bowel obstruction)  Discharge Diagnoses:  Principal Problem:  *Strangulation of small intestine Active Problems:  Hypertension  SBO (small bowel obstruction)   Discharged Condition: good  Hospital Course:   Consults: Pleasant healthy active woman.  Began to have abdominal pain on a flight back from a trip in [place].  Had worsening nausea and vomiting.  He came emergency room severely dehydrated.  Workup concerning for small bowel obstruction.  Patient was admitted.  Aggressively rehydrated.  Nasogastric tube decompression.  However, she failed to open out.  She was sent to the operating room on hospital day #2.  Had to have some small bowel resected.  Postoperatively she had a nasogastric tube placed.  Aggressive fluid hydration.  She began to mobilize well.  She eventually had tapered off of her nasogastric tube output.  Began to have some flatus.  Tolerating clamping trial.  It was removed.  Advanced on her diet.   The patient mobilized well, Walking over a mile in the hallways numerous times.  Pain was well-controlled and transitioned off IV medications.  She had only intermittent flatus at first but then began to have large volumes of flatus near the end of discharge abdominal distention began to go down  By the time of discharge, the patient was walking well the hallways, eating soft food & drinking liquids well, having flatus.  Pain was minimal off all meds.  Based on meeting DC criteria and recovering well, I felt it was safe for the patient to be discharged home with close  followup.  Instructions were discussed in detail.  They are written as well.  Significant Diagnostic Studies:   Treatments: surgery: ex lap, LOA, SB resection  Discharge Exam: Blood pressure 127/73, pulse 87, temperature 97.7 F (36.5 C), temperature source Oral, resp. rate 18, height 5' 7.5" (1.715 m), weight 132 lb (59.875 kg), SpO2 98.00%.  General: Pt awake/alert/oriented x4 in no major acute distress Eyes: PERRL, normal EOM. Sclera nonicteric Neuro: CN II-XII intact w/o focal sensory/motor deficits. Lymph: No head/neck/groin lymphadenopathy Psych:  No delerium/psychosis/paranoia.  Chipper, smiling, active HENT: Normocephalic, Mucus membranes moist.  No thrush Neck: Supple, No tracheal deviation Chest: No pain.  Good respiratory excursion. CV:  Pulses intact.  Regular rhythm Abdomen: Soft, Mildly distended.  Nontender.  No incarcerated hernias.  Incision clean Ext:  SCDs BLE.  No significant edema.  No cyanosis Skin: No petechiae / purpurae   Disposition: Final discharge disposition not confirmed  Discharge Orders    Future Orders Please Complete By Expires   Diet - low sodium heart healthy      Increase activity slowly      Call MD for:  persistant nausea and vomiting      Call MD for:  severe uncontrolled pain      Call MD for:  redness, tenderness, or signs of infection (pain, swelling, redness, odor or green/yellow discharge around incision site)      Call MD for:  extreme fatigue        Medication List  As of 11/29/2011 12:50 PM   TAKE these medications  acetaminophen 325 MG tablet   Commonly known as: TYLENOL   Take 650 mg by mouth every 6 (six) hours as needed.      HYDROcodone-acetaminophen 5-325 MG per tablet   Commonly known as: NORCO/VICODIN   Take 1-2 tablets by mouth every 6 (six) hours as needed for pain.      lisinopril 5 MG tablet   Commonly known as: PRINIVIL,ZESTRIL   Take 5 mg by mouth daily.      PEPTO-BISMOL TO-GO 262 MG chewable  tablet   Generic drug: bismuth subsalicylate   Chew 524 mg by mouth as needed. Upset stomach      sennosides-docusate sodium 8.6-50 MG tablet   Commonly known as: SENOKOT-S   Take 1-2 tablets by mouth 2 (two) times daily as needed for constipation.           Follow-up Information    Follow up with Ernestene Mention, MD. Schedule an appointment as soon as possible for a visit in 2 weeks.   Contact information:   [address] [place] [address] [phone]          Signed: Ardeth Sportsman. 11/29/2011, 12:50 PM

## 2011-12-01 NOTE — Care Management Note (Signed)
    Page 1 of 1   12/01/2011     9:13:19 AM   CARE MANAGEMENT NOTE 12/01/2011  Patient:  [facility]   Account Number:  [id number]  Date Initiated:  11/20/2011  Documentation initiated by:  Lorenda Ishihara  Subjective/Objective Assessment:   71 yo female admitted with SBO, s/p expl lap, SBR, lysis of adhesions. PTA lived at home with spouse.     Action/Plan:   Anticipated DC Date:  11/27/2011   Anticipated DC Plan:  HOME/SELF CARE      DC Planning Services  CM consult      Choice offered to / List presented to:             Status of service:  Completed, signed off Medicare Important Message given?   (If response is "NO", the following Medicare IM given date fields will be blank) Date Medicare IM given:   Date Additional Medicare IM given:    Discharge Disposition:  HOME/SELF CARE  Per UR Regulation:  Reviewed for med. necessity/level of care/duration of stay  If discussed at Long Length of Stay Meetings, dates discussed:    Comments:

## 2011-12-19 ENCOUNTER — Ambulatory Visit (INDEPENDENT_AMBULATORY_CARE_PROVIDER_SITE_OTHER): Payer: Medicare Other | Admitting: General Surgery

## 2011-12-19 ENCOUNTER — Encounter (INDEPENDENT_AMBULATORY_CARE_PROVIDER_SITE_OTHER): Payer: Self-pay | Admitting: General Surgery

## 2011-12-19 VITALS — BP 146/82 | HR 78 | Temp 97.4°F | Resp 18 | Ht 67.0 in | Wt 126.2 lb

## 2011-12-19 DIAGNOSIS — K562 Volvulus: Secondary | ICD-10-CM

## 2011-12-19 NOTE — Patient Instructions (Addendum)
You appear to be healing without any obvious surgical complications.  You are having some abdominal discomfort after meals and I'm not sure why that is.  You should  drink more water, and take probiotics once or twice a day.  You will be scheduled for blood work  Return to see Dr. Derrell Lolling in 5 weeks

## 2011-12-19 NOTE — Progress Notes (Signed)
Patient ID: Dana Hall, female   DOB: [birth year], 71 y.o.   MRN: [id number] History: This 71 year old woman underwent emergent surgery on 11/20/2011. She presented with acute abdominal pain. She was found to have a strangulated small bowel obstruction from adhesions from a remote appendectomy. She underwent small bowel resection of a short segment of small bowel, lysis of adhesions and she recovered. She states that she has lost 8 or 10 pounds. She is eating but not as much as she would like.    she says she has some background abdominal pain and she has more pain if she eats too much. Denies nausea or vomiting. She is a little bit afraid to eat and notices a lot of rumbling in her abdomen. She is having probably 2 bowel movements per day, softer than before but still solid no blood.Denies nausea or vomiting.  Exam: Patient is pleasant and does not appear to be in any distress. Her husband is with her. Weight 126, temp 97 4, pulse 78, BP 146/82. Abdomen soft. Midline wound well healed. A little bit uncomfortable to palpate bilaterally. Bowel sounds are normal to hyperactive but did not sound obstructive. There is no hernia.  Assessment: 4 weeks postop emergent exploratory laparotomy and small bowel resection for a short segment strangulated SBO. Postprandial abdominall pain. It is not clear whether this is simply a motility disorder or whether there is a pathologic problem. She looks well and not in any distress.  Plan: Drink more water. Take probiotics twice a day Avoid fatty foods We'll check CBC, lipase, complete metabolic panel Return to see me in 5 weeks, sooner if she has any further symptoms   Dana Hall M. Derrell Lolling, M.D., [facility] General and Minimally invasive Surgery Breast and Colorectal Surgery Office:   [phone] Pager:   [phone]   .

## 2011-12-22 ENCOUNTER — Other Ambulatory Visit (INDEPENDENT_AMBULATORY_CARE_PROVIDER_SITE_OTHER): Payer: Self-pay | Admitting: General Surgery

## 2011-12-22 DIAGNOSIS — K562 Volvulus: Secondary | ICD-10-CM

## 2011-12-22 LAB — COMPREHENSIVE METABOLIC PANEL
ALT: 13 U/L (ref 0–35)
AST: 20 U/L (ref 0–37)
Albumin: 4.1 g/dL (ref 3.5–5.2)
CO2: 23 mEq/L (ref 19–32)
Calcium: 9.5 mg/dL (ref 8.4–10.5)
Chloride: 102 mEq/L (ref 96–112)
Creat: 0.5 mg/dL (ref 0.50–1.10)
Potassium: 4.5 mEq/L (ref 3.5–5.3)
Sodium: 137 mEq/L (ref 135–145)
Total Protein: 6.7 g/dL (ref 6.0–8.3)

## 2012-01-26 ENCOUNTER — Encounter (INDEPENDENT_AMBULATORY_CARE_PROVIDER_SITE_OTHER): Payer: Self-pay | Admitting: General Surgery

## 2012-01-26 ENCOUNTER — Ambulatory Visit (INDEPENDENT_AMBULATORY_CARE_PROVIDER_SITE_OTHER): Payer: Medicare Other | Admitting: General Surgery

## 2012-01-26 VITALS — BP 134/72 | HR 84 | Temp 97.4°F | Resp 16 | Ht 67.5 in | Wt 128.8 lb

## 2012-01-26 DIAGNOSIS — K562 Volvulus: Secondary | ICD-10-CM

## 2012-01-26 HISTORY — DX: Volvulus: K56.2

## 2012-01-26 NOTE — Patient Instructions (Signed)
Your physical exam today is normal. You appear to have recovered from your intestinal surgery without any obvious complications. Your lab work was normal.  You may resume normal physical activities without restriction.  Return to see Dr. Derrell Lolling if further problems arise.

## 2012-01-26 NOTE — Progress Notes (Signed)
Patient ID: Dana Hall, female   DOB: 07-02-1940, 71 y.o.   MRN: [id number] This patient was operated upon urgently on 11/20/2011 for a strangulated small bowel obstruction. She underwent lysis of adhesions and small bowel resection. Final pathology showed ischemic changes. She was slow to regain motility but feels fine now. She has gone back to normal activities. She is tolerating regular diet without any abdominal or GI symptoms. She is having regular well-formed bowel movements. Lab work performed on September 6 was normal.  On exam, she looks well. Her husband is with her. Abdomen soft and nontender. Lower midline incision well-healed. No hernia  Assessment and plan: Strangulated small bowel obstruction, uneventful recovery following lysis of adhesions and small bowel resection. Resume normal activities, no restriction on diet discussed Return to see me when necessary   Angelia Mould. Derrell Lolling, M.D., [facility] General and Minimally invasive Surgery Breast and Colorectal Surgery Office:   [phone] Pager:   [phone]

## 2012-02-02 ENCOUNTER — Other Ambulatory Visit: Payer: Self-pay | Admitting: Family Medicine

## 2012-02-02 DIAGNOSIS — Z1231 Encounter for screening mammogram for malignant neoplasm of breast: Secondary | ICD-10-CM

## 2012-03-18 ENCOUNTER — Ambulatory Visit
Admission: RE | Admit: 2012-03-18 | Discharge: 2012-03-18 | Disposition: A | Discharge status: Home / Self Care | Payer: Medicare Other | Source: Ambulatory Visit | Attending: Family Medicine | Admitting: Family Medicine

## 2012-03-18 DIAGNOSIS — Z1231 Encounter for screening mammogram for malignant neoplasm of breast: Secondary | ICD-10-CM

## 2013-02-07 ENCOUNTER — Other Ambulatory Visit: Payer: Self-pay

## 2013-02-07 DIAGNOSIS — Z1231 Encounter for screening mammogram for malignant neoplasm of breast: Secondary | ICD-10-CM

## 2013-03-21 ENCOUNTER — Ambulatory Visit
Admission: RE | Admit: 2013-03-21 | Discharge: 2013-03-21 | Disposition: A | Discharge status: Home / Self Care | Payer: 59 | Source: Ambulatory Visit

## 2013-03-21 DIAGNOSIS — Z1231 Encounter for screening mammogram for malignant neoplasm of breast: Secondary | ICD-10-CM

## 2014-02-13 ENCOUNTER — Other Ambulatory Visit: Payer: Self-pay

## 2014-02-13 DIAGNOSIS — Z1231 Encounter for screening mammogram for malignant neoplasm of breast: Secondary | ICD-10-CM

## 2014-03-22 ENCOUNTER — Ambulatory Visit
Admission: RE | Admit: 2014-03-22 | Discharge: 2014-03-22 | Disposition: A | Discharge status: Home / Self Care | Payer: 59 | Source: Ambulatory Visit

## 2014-03-22 DIAGNOSIS — Z1231 Encounter for screening mammogram for malignant neoplasm of breast: Secondary | ICD-10-CM

## 2015-02-12 ENCOUNTER — Other Ambulatory Visit: Payer: Self-pay

## 2015-02-12 DIAGNOSIS — Z1231 Encounter for screening mammogram for malignant neoplasm of breast: Secondary | ICD-10-CM

## 2015-02-21 ENCOUNTER — Ambulatory Visit: Payer: 59

## 2015-03-28 ENCOUNTER — Ambulatory Visit
Admission: RE | Admit: 2015-03-28 | Discharge: 2015-03-28 | Disposition: A | Discharge status: Home / Self Care | Payer: Medicare Other | Source: Ambulatory Visit

## 2015-03-28 DIAGNOSIS — Z1231 Encounter for screening mammogram for malignant neoplasm of breast: Secondary | ICD-10-CM

## 2015-04-18 ENCOUNTER — Other Ambulatory Visit: Payer: Self-pay

## 2015-04-18 DIAGNOSIS — Z1231 Encounter for screening mammogram for malignant neoplasm of breast: Secondary | ICD-10-CM

## 2016-04-10 ENCOUNTER — Ambulatory Visit
Admission: RE | Admit: 2016-04-10 | Discharge: 2016-04-10 | Disposition: A | Discharge status: Home / Self Care | Payer: Medicare Other | Source: Ambulatory Visit

## 2016-04-10 DIAGNOSIS — Z1231 Encounter for screening mammogram for malignant neoplasm of breast: Secondary | ICD-10-CM

## 2017-03-02 ENCOUNTER — Other Ambulatory Visit: Payer: Self-pay | Admitting: Family Medicine

## 2017-03-02 DIAGNOSIS — Z1231 Encounter for screening mammogram for malignant neoplasm of breast: Secondary | ICD-10-CM

## 2017-04-13 ENCOUNTER — Ambulatory Visit
Admission: RE | Admit: 2017-04-13 | Discharge: 2017-04-13 | Disposition: A | Discharge status: Home / Self Care | Payer: Medicare Other | Source: Ambulatory Visit | Attending: Family Medicine | Admitting: Family Medicine

## 2017-04-13 DIAGNOSIS — Z1231 Encounter for screening mammogram for malignant neoplasm of breast: Secondary | ICD-10-CM

## 2017-09-21 DIAGNOSIS — M25552 Pain in left hip: Secondary | ICD-10-CM | POA: Insufficient documentation

## 2018-03-09 ENCOUNTER — Other Ambulatory Visit: Payer: Self-pay | Admitting: Family Medicine

## 2018-03-09 DIAGNOSIS — Z1231 Encounter for screening mammogram for malignant neoplasm of breast: Secondary | ICD-10-CM

## 2018-04-26 ENCOUNTER — Ambulatory Visit
Admission: RE | Admit: 2018-04-26 | Discharge: 2018-04-26 | Disposition: A | Discharge status: Home / Self Care | Payer: Medicare Other | Source: Ambulatory Visit | Attending: Family Medicine | Admitting: Family Medicine

## 2018-04-26 DIAGNOSIS — Z1231 Encounter for screening mammogram for malignant neoplasm of breast: Secondary | ICD-10-CM

## 2018-04-27 ENCOUNTER — Ambulatory Visit: Payer: Medicare Other

## 2018-06-21 DIAGNOSIS — H8110 Benign paroxysmal vertigo, unspecified ear: Secondary | ICD-10-CM | POA: Insufficient documentation

## 2018-06-28 ENCOUNTER — Encounter: Payer: Self-pay | Admitting: Cardiovascular Disease

## 2018-07-01 ENCOUNTER — Telehealth: Payer: Self-pay

## 2018-07-01 NOTE — Telephone Encounter (Addendum)
   Cardiac Questionnaire:    Since your last visit or hospitalization:    1. Have you been having new or worsening chest pain? NO   2. Have you been having new or worsening shortness of breath? NO 3. Have you been having new or worsening leg swelling, wt gain, or increase in abdominal girth (pants fitting more tightly)? NO   4. Have you had any passing out spells? NO    *A YES to any of these questions would result in the appointment being kept. *If all the answers to these questions are NO, we should indicate that given the current situation regarding the worldwide coronarvirus pandemic, at the recommendation of the CDC, we are looking to limit gatherings in our waiting area, and thus will reschedule their appointment beyond four weeks from today.   _____________   COVID-19 Pre-Screening Questions:  . Have you recently travelled abroad or to [place], [place] state, or [place]? NO (4) . Do you currently have a fever? NO (4) . Have you been in contact with someone that is currently pending confirmation of Covid19 testing or has been confirmed to have the Covid19 virus?  NO (4) . Are you currently experiencing fatigue or cough? NO (2) . Are you currently experiencing new or worsening shortness of breath at rest or with minimal activity? NO (1) . Have you been in contact with someone that was recently sick with fever/cough/fatigue? NO (1)   **A score of 4 or more should result in cancellation of the pts cardiology appt.  **A score of  2 should be provided a mask prior to admission into the lobby **Travel to a high risk area or contact with a confirmed case should stay at home, away from confirmed patient, monitor symptoms, and reach out to PCP for e-visit, additional testing.  *ALL PTS W/ FEVER SHOULD BE REFERRED TO PCP FOR E-VISIT* 

## 2018-07-07 ENCOUNTER — Ambulatory Visit: Payer: Medicare Other | Admitting: Cardiovascular Disease

## 2018-07-30 ENCOUNTER — Telehealth: Payer: Self-pay | Admitting: Cardiovascular Disease

## 2018-07-30 NOTE — Telephone Encounter (Signed)
New Message  Patient returning Brianna's call would like her to call her back in 15 minutes.

## 2018-07-30 NOTE — Telephone Encounter (Signed)
I just spoke to the patient and informed her that there is no Brianna in our office that was trying to reach her.  She said that is ok and she will just await another call, if needed.

## 2018-08-05 NOTE — Telephone Encounter (Addendum)
Called pt re: rescheduling her new pt appt that was cancelled with Dr. Elease Hashimoto in March. Pt stated that she is feeling ok and she wants to wait until Dr. Elease Hashimoto is in the office. Pt understands to call in June to get on Dr. Harvie Bridge schedule for August.  Pt verbalized understanding and thanked me for the call.

## 2018-10-12 ENCOUNTER — Telehealth: Payer: Self-pay

## 2018-10-12 NOTE — Telephone Encounter (Signed)
Spoke with pt and she gave verbal consent for a video visit for her appt. Pt will have vitals ready at appt time.    YOUR CARDIOLOGY TEAM HAS ARRANGED FOR AN E-VISIT FOR YOUR APPOINTMENT - PLEASE REVIEW IMPORTANT INFORMATION BELOW SEVERAL DAYS PRIOR TO YOUR APPOINTMENT  Due to the recent COVID-19 pandemic, we are transitioning in-person office visits to tele-medicine visits in an effort to decrease unnecessary exposure to our patients, their families, and staff. These visits are billed to your insurance just like a normal visit is. We also encourage you to sign up for MyChart if you have not already done so. You will need a smartphone if possible. For patients that do not have this, we can still complete the visit using a regular telephone but do prefer a smartphone to enable video when possible. You may have a family member that lives with you that can help. If possible, we also ask that you have a blood pressure cuff and scale at home to measure your blood pressure, heart rate and weight prior to your scheduled appointment. Patients with clinical needs that need an in-person evaluation and testing will still be able to come to the office if absolutely necessary. If you have any questions, feel free to call our office.     YOUR PROVIDER WILL BE USING THE FOLLOWING PLATFORM TO COMPLETE YOUR VISIT: [email or web address] . IF USING MYCHART - How to Download the MyChart App to Your SmartPhone   - If Apple, go to [employer] and type in MyChart in the search bar and download the app. If Android, ask patient to go to [employer] and type in [place] in the search bar and download the app. The app is free but as with any other app downloads, your phone may require you to verify saved payment information or Apple/Android password.  - You will need to then log into the app with your MyChart username and password, and select [place] as your healthcare provider to link the account.  - When it is time for your visit,  go to the MyChart app, find appointments, and click Begin Video Visit. Be sure to Select Allow for your device to access the Microphone and Camera for your visit. You will then be connected, and your provider will be with you shortly.  **If you have any issues connecting or need assistance, please contact MyChart service desk [phone])**  **If using a computer, in order to ensure the best quality for your visit, you will need to use either of the following Internet Browsers: Insurance underwriter or [employer]**  . IF USING DOXIMITY or [email or web address].ME - The staff will give you instructions on receiving your link to join the meeting the day of your visit.      2-3 DAYS BEFORE YOUR APPOINTMENT  You will receive a telephone call from one of our [place] team members - your caller ID may say "Unknown caller." If this is a video visit, we will walk you through how to get the video launched on your phone. We will remind you check your blood pressure, heart rate and weight prior to your scheduled appointment. If you have an Apple Watch or Kardia, please upload any pertinent ECG strips the day before or morning of your appointment to [place]. Our staff will also make sure you have reviewed the consent and agree to move forward with your scheduled tele-health visit.     THE DAY OF YOUR APPOINTMENT  Approximately 15 minutes  prior to your scheduled appointment, you will receive a telephone call from one of [place] team - your caller ID may say "Unknown caller."  Our staff will confirm medications, vital signs for the day and any symptoms you may be experiencing. Please have this information available prior to the time of visit start. It may also be helpful for you to have a pad of paper and pen handy for any instructions given during your visit. They will also walk you through joining the smartphone meeting if this is a video visit.    CONSENT FOR TELE-HEALTH VISIT - PLEASE REVIEW  I hereby  voluntarily request, consent and authorize [facility] and its employed or contracted physicians, physician assistants, nurse practitioners or other licensed health care professionals (the Practitioner), to provide me with telemedicine health care services (the "Services") as deemed necessary by the treating Practitioner. I acknowledge and consent to receive the Services by the Practitioner via telemedicine. I understand that the telemedicine visit will involve communicating with the Practitioner through live audiovisual communication technology and the disclosure of certain medical information by electronic transmission. I acknowledge that I have been given the opportunity to request an in-person assessment or other available alternative prior to the telemedicine visit and am voluntarily participating in the telemedicine visit.  I understand that I have the right to withhold or withdraw my consent to the use of telemedicine in the course of my care at any time, without affecting my right to future care or treatment, and that the Practitioner or I may terminate the telemedicine visit at any time. I understand that I have the right to inspect all information obtained and/or recorded in the course of the telemedicine visit and may receive copies of available information for a reasonable fee.  I understand that some of the potential risks of receiving the Services via telemedicine include:  Marland Kitchen Delay or interruption in medical evaluation due to technological equipment failure or disruption; . Information transmitted may not be sufficient (e.g. poor resolution of images) to allow for appropriate medical decision making by the Practitioner; and/or  . In rare instances, security protocols could fail, causing a breach of personal health information.  Furthermore, I acknowledge that it is my responsibility to provide information about my medical history, conditions and care that is complete and accurate to the best  of my ability. I acknowledge that Practitioner's advice, recommendations, and/or decision may be based on factors not within their control, such as incomplete or inaccurate data provided by me or distortions of diagnostic images or specimens that may result from electronic transmissions. I understand that the practice of medicine is not an exact science and that Practitioner makes no warranties or guarantees regarding treatment outcomes. I acknowledge that I will receive a copy of this consent concurrently upon execution via email to the email address I last provided but may also request a printed copy by calling the office of [facility].    I understand that my insurance will be billed for this visit.   I have read or had this consent read to me. . I understand the contents of this consent, which adequately explains the benefits and risks of the Services being provided via telemedicine.  . I have been provided ample opportunity to ask questions regarding this consent and the Services and have had my questions answered to my satisfaction. . I give my informed consent for the services to be provided through the use of telemedicine in my medical care  By  participating in this telemedicine visit I agree to the above.

## 2018-10-19 ENCOUNTER — Other Ambulatory Visit: Payer: Self-pay

## 2018-10-19 ENCOUNTER — Telehealth (INDEPENDENT_AMBULATORY_CARE_PROVIDER_SITE_OTHER): Payer: Medicare Other | Admitting: Cardiovascular Disease

## 2018-10-19 ENCOUNTER — Encounter: Payer: Self-pay | Admitting: Cardiovascular Disease

## 2018-10-19 VITALS — BP 122/75 | HR 80 | Ht 67.5 in | Wt 126.8 lb

## 2018-10-19 DIAGNOSIS — Z7189 Other specified counseling: Secondary | ICD-10-CM

## 2018-10-19 DIAGNOSIS — I119 Hypertensive heart disease without heart failure: Secondary | ICD-10-CM

## 2018-10-19 DIAGNOSIS — I10 Essential (primary) hypertension: Secondary | ICD-10-CM | POA: Diagnosis not present

## 2018-10-19 DIAGNOSIS — I951 Orthostatic hypotension: Secondary | ICD-10-CM

## 2018-10-19 DIAGNOSIS — R0602 Shortness of breath: Secondary | ICD-10-CM

## 2018-10-19 NOTE — Progress Notes (Signed)
Virtual Visit via Video Note   This visit type was conducted due to national recommendations for restrictions regarding the COVID-19 Pandemic (e.g. social distancing) in an effort to limit this patient's exposure and mitigate transmission in our community.  Due to her co-morbid illnesses, this patient is at least at moderate risk for complications without adequate follow up.  This format is felt to be most appropriate for this patient at this time.  All issues noted in this document were discussed and addressed.  A limited physical exam was performed with this format.  Please refer to the patient's chart for her consent to telehealth for [facility].   Date:  10/19/2018   ID:  Dana Hall, DOB 05-01-1940, MRN [id number]  Patient Location: Home Provider Location: Home  PCP:  Donald Prose, MD  Cardiologist:   Nahser  Electrophysiologist:  None   Evaluation Performed:  New Patient Evaluation  Chief Complaint:  HTN  October 19, 2018     Dana Hall is a 78 y.o. female with HTN.    Wife of Dana Hall  She has had some issues with BP.   Woke up with dizziness,  Very unsteady on her feet.   200/100 Saw her primary MD.  Was started on amlodipine .   Lasted for weeks .  BP is now normalized.   Has been avoiding salt.   Recent labs Jan. 2020  NA= 135 Creat = 0.61 K = 4.7  Exercises regularly.   Walks a mile a day .  Very active around the house,  [place] Leaver and garden work    The patient does not have symptoms concerning for COVID-19 infection (fever, chills, cough, or new shortness of breath).    Past Medical History:  Diagnosis Date  . Actinic keratosis    Dr.Gould  . Anemia   . Asthma    as a child  . BPPV (benign paroxysmal positional vertigo), unspecified laterality   . DDD (degenerative disc disease), lumbar    L5 facet arthritis and foraminal stenosis  . Degenerative cervical disc    spondylosis  . GERD (gastroesophageal reflux disease)   .  Hypercholesterolemia   . Hypertension 11/21/2011  . SBO (small bowel obstruction) ([place]) 11/29/2011  . Sensorineural hearing loss (SNHL) of both ears   . Strangulation obstruction of intestine ([place]) 01/26/2012  . Strangulation of small intestine ([place]) 11/20/2011   Past Surgical History:  Procedure Laterality Date  . APPENDECTOMY    . LAPAROTOMY  11/20/2011   Procedure: SB resection & repair of hernia.  EXPLORATORY LAPAROTOMY;  Surgeon: Adin Hector, MD;  Location: [facility];  Service: General;  Laterality: N/A;  exploratory laparotomy for bowel obstruction  . MULTIPLE TOOTH EXTRACTIONS     age 18     No outpatient medications have been marked as taking for the 10/19/18 encounter (Appointment) with Nahser, Wonda Cheng, MD.     Allergies:   Patient has no known allergies.   Social History   Tobacco Use  . Smoking status: Never Smoker  . Smokeless tobacco: Never Used  Substance Use Topics  . Alcohol use: Yes  . Drug use: No     Family Hx: The patient's family history includes Heart disease in her mother. There is no history of Breast cancer.  ROS:   Please see the history of present illness.     All other systems reviewed and are negative.   Prior CV studies:   The following studies were reviewed today:  Labs/Other Tests and Data Reviewed:    EKG:  No ECG reviewed.  Recent Labs: No results found for requested labs within last 8760 hours.   Recent Lipid Panel No results found for: CHOL, TRIG, HDL, CHOLHDL, LDLCALC, LDLDIRECT  Wt Readings from Last 3 Encounters:  01/26/12 128 lb 12.8 oz (58.4 kg)  12/19/11 126 lb 3.2 oz (57.2 kg)  11/20/11 132 lb (59.9 kg)     Objective:    Vital Signs:  There were no vitals taken for this visit.   VITAL SIGNS:  reviewed GEN:  no acute distress EYES:  sclerae anicteric, EOMI - Extraocular Movements Intact RESPIRATORY:  normal respiratory effort, symmetric expansion CARDIOVASCULAR:  no peripheral edema SKIN:  no rash, lesions or  ulcers. MUSCULOSKELETAL:  no obvious deformities. 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   In Feb. She had multiple episodes of near syncope - likely due to orthostatic hypotension,  Has also had some shortness of breath .   Amlodipine was added,  She is feeling better. At this point   Will get an echo to further evaluate her LV function and valvular function. Advised her to avoid excesive salt .   Advised more water intake.  Will DC asa.  Encouraged tylenol for headaches and various aches and pains.   Will see her in 3 months.   COVID-19 Education: The signs and symptoms of COVID-19 were discussed with the patient and how to seek care for testing (follow up with PCP or arrange E-visit).  The importance of social distancing was discussed today.  Time:   Today, I have spent  35  minutes with the patient with telehealth technology discussing the above problems.     Medication Adjustments/Labs and Tests Ordered: Current medicines are reviewed at length with the patient today.  Concerns regarding medicines are outlined above.   Tests Ordered: No orders of the defined types were placed in this encounter.   Medication Changes: No orders of the defined types were placed in this encounter.   Follow Up:  Virtual Visit or In Person in 3 month(s)  Signed, Kristeen MissPhilip Nahser, MD  10/19/2018 7:32 AM     [facility]

## 2018-10-19 NOTE — Patient Instructions (Signed)
Medication Instructions:  Your physician has recommended you make the following change in your medication:  STOP Aspirin  If you need a refill on your cardiac medications before your next appointment, please call your pharmacy.    Lab work: None Ordered   Testing/Procedures: Your physician has requested that you have an echocardiogram. Echocardiography is a painless test that uses sound waves to create images of your heart. It provides your doctor with information about the size and shape of your heart and how well your heart's chambers and valves are working. This procedure takes approximately one hour. There are no restrictions for this procedure.    Follow-Up: Your physician recommends that you schedule a follow-up appointment in: 3 months with Dr. Acie Fredrickson

## 2018-10-20 ENCOUNTER — Ambulatory Visit (HOSPITAL_COMMUNITY): Payer: Medicare Other | Attending: Cardiovascular Disease

## 2018-10-20 ENCOUNTER — Other Ambulatory Visit: Payer: Self-pay

## 2018-10-20 DIAGNOSIS — R0602 Shortness of breath: Secondary | ICD-10-CM

## 2018-10-20 DIAGNOSIS — I119 Hypertensive heart disease without heart failure: Secondary | ICD-10-CM | POA: Diagnosis present

## 2018-12-07 ENCOUNTER — Ambulatory Visit: Payer: Medicare Other | Admitting: Cardiovascular Disease

## 2019-01-23 NOTE — Progress Notes (Signed)
Cardiology Office Note:    Date:  01/24/2019   ID:  Dana Hall, DOB 04/03/1941, MRN [id number]  PCP:  Deatra JamesSun, Vyvyan, MD  Cardiologist:    Brightyn Mozer   Electrophysiologist:  None   Referring MD: Deatra JamesSun, Vyvyan, MD   Chief Complaint  Patient presents with  . Hypertension    Oct. 12, 2020    Dana Hall is a 78 y.o. female with a hx of HTN.  She has had some dizziness I saw her via telemedicine visit in July, 2020 and thought she had orthostatic hypotension. Echo showed normal LV systolic function and grade 2 diastolic dysfunction She has mild Aortic insufficiency  Overall is feeling well BP is a bit high today .   Has been rushing around this am.  Takes her BP at home and typically gets 120's   Remains active .      Past Medical History:  Diagnosis Date  . Actinic keratosis    Dr.Gould  . Anemia   . Asthma    as a child  . BPPV (benign paroxysmal positional vertigo), unspecified laterality   . DDD (degenerative disc disease), lumbar    L5 facet arthritis and foraminal stenosis  . Degenerative cervical disc    spondylosis  . GERD (gastroesophageal reflux disease)   . Hypercholesterolemia   . Hypertension 11/21/2011  . SBO (small bowel obstruction) ([facility]) 11/29/2011  . Sensorineural hearing loss (SNHL) of both ears   . Strangulation obstruction of intestine ([facility]) 01/26/2012  . Strangulation of small intestine ([facility]) 11/20/2011    Past Surgical History:  Procedure Laterality Date  . APPENDECTOMY    . LAPAROTOMY  11/20/2011   Procedure: SB resection & repair of hernia.  EXPLORATORY LAPAROTOMY;  Surgeon: Ernestene MentionHaywood M Ingram, MD;  Location: [facility];  Service: General;  Laterality: N/A;  exploratory laparotomy for bowel obstruction  . MULTIPLE TOOTH EXTRACTIONS     age 89    Current Medications: Current Meds  Medication Sig  . amLODipine (NORVASC) 5 MG tablet Take 5 mg by mouth daily.  Marland Kitchen. lisinopril (ZESTRIL) 20 MG tablet Take 20 mg by mouth daily.     Allergies:    Patient has no known allergies.   Social History   Socioeconomic History  . Marital status: Married    Spouse name: Not on file  . Number of children: 3  . Years of education: Not on file  . Highest education level: Not on file  Occupational History  . Occupation: retired Cabin crewspecial ed Runner, broadcasting/film/videoteacher  Social Needs  . Financial resource strain: Not on file  . Food insecurity    Worry: Not on file    Inability: Not on file  . Transportation needs    Medical: Not on file    Non-medical: Not on file  Tobacco Use  . Smoking status: Never Smoker  . Smokeless tobacco: Never Used  Substance and Sexual Activity  . Alcohol use: Yes  . Drug use: No  . Sexual activity: Not on file  Lifestyle  . Physical activity    Days per week: Not on file    Minutes per session: Not on file  . Stress: Not on file  Relationships  . Social Musicianconnections    Talks on phone: Not on file    Gets together: Not on file    Attends religious service: Not on file    Active member of club or organization: Not on file    Attends meetings of clubs or organizations: Not  on file    Relationship status: Not on file  Other Topics Concern  . Not on file  Social History Narrative  . Not on file     Family History: The patient's family history includes Heart disease in her mother. There is no history of Breast cancer.  ROS:   Please see the history of present illness.     All other systems reviewed and are negative.  EKGs/Labs/Other Studies Reviewed:    The following studies were reviewed today:   EKG:   Oct. 12, 2020 :  NSR with occasional PACs.   NS IVCD .    Recent Labs: No results found for requested labs within last 8760 hours.  Recent Lipid Panel No results found for: CHOL, TRIG, HDL, CHOLHDL, VLDL, LDLCALC, LDLDIRECT  Physical Exam:    VS:  BP 140/76   Pulse 78   Ht 5\' 7"  (1.702 m)   Wt 129 lb 1.9 oz (58.6 kg)   SpO2 99%   BMI 20.22 kg/m     Wt Readings from Last 3 Encounters:  01/24/19 129  lb 1.9 oz (58.6 kg)  10/19/18 126 lb 12.8 oz (57.5 kg)  01/26/12 128 lb 12.8 oz (58.4 kg)     GEN: elderly female,   Well nourished, well developed in no acute distress HEENT: Normal NECK: No JVD; No carotid bruits LYMPHATICS: No lymphadenopathy CARDIAC:  RR , soft systolic murmur  RESPIRATORY:  Clear to auscultation without rales, wheezing or rhonchi  ABDOMEN: Soft, non-tender, non-distended MUSCULOSKELETAL:  No edema; No deformity  SKIN: Warm and dry NEUROLOGIC:  Alert and oriented x 3 PSYCHIATRIC:  Normal affect   ASSESSMENT:    1. Essential hypertension    PLAN:    In order of problems listed above:  1. HTN:   Blood pressures typically very well controlled.  She was rushing around a bit today's which likely explains her mildly elevated blood pressure.  She is very active and has been exercising on a regular basis.  2.  Chronic diastolic congestive heart failure: She has grade 2 diastolic dysfunction by echo.  He is basically asymptomatic.  We will remind her to watch her salt intake.  3.  Mild aortic insufficiency: This should not ever be a problem for her.  We will see her again in 1 year for follow-up visit.   Medication Adjustments/Labs and Tests Ordered: Current medicines are reviewed at length with the patient today.  Concerns regarding medicines are outlined above.  Orders Placed This Encounter  Procedures  . EKG 12-Lead   No orders of the defined types were placed in this encounter.   Patient Instructions  Medication Instructions:  Your physician recommends that you continue on your current medications as directed. Please refer to the Current Medication list given to you today.  If you need a refill on your cardiac medications before your next appointment, please call your pharmacy.   Lab work: None Ordered   Testing/Procedures: None Ordered   Follow-Up: At 01/28/12, you and your health needs are our priority.  As part of our continuing  mission to provide you with exceptional heart care, we have created designated Provider Care Teams.  These Care Teams include your primary Cardiologist (physician) and Advanced Practice Providers (APPs -  Physician Assistants and Nurse Practitioners) who all work together to provide you with the care you need, when you need it. You will need a follow up appointment in:  1 years.  Please call our office 2  months in advance to schedule this appointment.  You may see Dr. Acie Fredrickson or one of the following Advanced Practice Providers on your designated Care Team: Richardson Dopp, PA-C [place], [place] . Daune Perch, NP      Signed, Mertie Moores, MD  01/24/2019 11:10 AM    [place]

## 2019-01-24 ENCOUNTER — Other Ambulatory Visit: Payer: Self-pay

## 2019-01-24 ENCOUNTER — Encounter: Payer: Self-pay | Admitting: Cardiovascular Disease

## 2019-01-24 ENCOUNTER — Ambulatory Visit: Payer: Medicare Other | Admitting: Cardiovascular Disease

## 2019-01-24 VITALS — BP 140/76 | HR 78 | Ht 67.0 in | Wt 129.1 lb

## 2019-01-24 DIAGNOSIS — I351 Nonrheumatic aortic (valve) insufficiency: Secondary | ICD-10-CM

## 2019-01-24 DIAGNOSIS — I10 Essential (primary) hypertension: Secondary | ICD-10-CM | POA: Diagnosis not present

## 2019-01-24 NOTE — Patient Instructions (Signed)
Medication Instructions:  Your physician recommends that you continue on your current medications as directed. Please refer to the Current Medication list given to you today.  If you need a refill on your cardiac medications before your next appointment, please call your pharmacy.   Lab work: None Ordered    Testing/Procedures: None Ordered   Follow-Up: At [facility], you and your health needs are our priority.  As part of our continuing mission to provide you with exceptional heart care, we have created designated Provider Care Teams.  These Care Teams include your primary Cardiologist (physician) and Advanced Practice Providers (APPs -  Physician Assistants and Nurse Practitioners) who all work together to provide you with the care you need, when you need it. You will need a follow up appointment in:  1 years.  Please call our office 2 months in advance to schedule this appointment.  You may see Dr. Nahser or one of the following Advanced Practice Providers on your designated Care Team: Scott Weaver, PA-C Vin Bhagat, PA-C . Janine Hammond, NP    

## 2019-03-30 ENCOUNTER — Other Ambulatory Visit: Payer: Self-pay | Admitting: Family Medicine

## 2019-03-30 DIAGNOSIS — Z1231 Encounter for screening mammogram for malignant neoplasm of breast: Secondary | ICD-10-CM

## 2019-05-14 ENCOUNTER — Ambulatory Visit: Payer: Medicare Other

## 2019-05-18 ENCOUNTER — Ambulatory Visit
Admission: RE | Admit: 2019-05-18 | Discharge: 2019-05-18 | Disposition: A | Discharge status: Home / Self Care | Payer: Medicare PPO | Source: Ambulatory Visit | Attending: Family Medicine | Admitting: Family Medicine

## 2019-05-18 ENCOUNTER — Other Ambulatory Visit: Payer: Self-pay

## 2019-05-18 DIAGNOSIS — Z1231 Encounter for screening mammogram for malignant neoplasm of breast: Secondary | ICD-10-CM

## 2019-05-20 ENCOUNTER — Ambulatory Visit: Payer: Medicare PPO | Attending: Internal Medicine

## 2019-05-20 DIAGNOSIS — Z23 Encounter for immunization: Secondary | ICD-10-CM

## 2019-05-20 NOTE — Progress Notes (Signed)
   Covid-19 Vaccination Clinic  Name:  Dana Hall    MRN: [id number] DOB: 06/22/1940  05/20/2019  [name] was observed post Covid-19 immunization for 30 minutes based on pre-vaccination screening without incidence. She was provided with Vaccine Information Sheet and instruction to access the V-Safe system.   [name] was instructed to call 911 with any severe reactions post vaccine: Marland Kitchen Difficulty breathing  . Swelling of your face and throat  . A fast heartbeat  . A bad rash all over your body  . Dizziness and weakness    Immunizations Administered    Name Date Dose VIS Date Route   Pfizer COVID-19 Vaccine 05/20/2019  1:33 PM 0.3 mL 03/25/2019 Intramuscular   Manufacturer: [employer], [employer]   Lot: [id number]   NDC: [address]-2

## 2019-05-21 ENCOUNTER — Ambulatory Visit: Payer: Medicare PPO

## 2019-06-04 ENCOUNTER — Ambulatory Visit: Payer: Medicare Other

## 2019-06-14 ENCOUNTER — Ambulatory Visit: Payer: Medicare PPO | Attending: Internal Medicine

## 2019-06-14 DIAGNOSIS — Z23 Encounter for immunization: Secondary | ICD-10-CM | POA: Insufficient documentation

## 2019-06-14 NOTE — Progress Notes (Signed)
   Covid-19 Vaccination Clinic  Name:  SHALA BAUMBACH    MRN: [id number] DOB: [birth year]  06/14/2019  Ms. Morrissette was observed post Covid-19 immunization for 15 minutes without incident. She was provided with Vaccine Information Sheet and instruction to access the V-Safe system.   Ms. Hopkinson was instructed to call 911 with any severe reactions post vaccine: Marland Kitchen Difficulty breathing  . Swelling of face and throat  . A fast heartbeat  . A bad rash all over body  . Dizziness and weakness   Immunizations Administered    Name Date Dose VIS Date Route   Pfizer COVID-19 Vaccine 06/14/2019  1:52 PM 0.3 mL 03/25/2019 Intramuscular   Manufacturer: [employer], [employer]   Lot: [id number]   NDC: [address]-9

## 2019-10-06 DIAGNOSIS — Z974 Presence of external hearing-aid: Secondary | ICD-10-CM | POA: Diagnosis not present

## 2019-10-06 DIAGNOSIS — H903 Sensorineural hearing loss, bilateral: Secondary | ICD-10-CM | POA: Diagnosis not present

## 2019-10-06 DIAGNOSIS — H6123 Impacted cerumen, bilateral: Secondary | ICD-10-CM | POA: Diagnosis not present

## 2019-11-22 DIAGNOSIS — H52203 Unspecified astigmatism, bilateral: Secondary | ICD-10-CM | POA: Diagnosis not present

## 2019-11-22 DIAGNOSIS — Z961 Presence of intraocular lens: Secondary | ICD-10-CM | POA: Diagnosis not present

## 2020-01-06 ENCOUNTER — Other Ambulatory Visit: Payer: Medicare PPO

## 2020-01-06 DIAGNOSIS — Z20822 Contact with and (suspected) exposure to covid-19: Secondary | ICD-10-CM

## 2020-01-08 LAB — NOVEL CORONAVIRUS, NAA: SARS-CoV-2, NAA: DETECTED — AB

## 2020-01-08 LAB — SARS-COV-2, NAA 2 DAY TAT

## 2020-02-14 DIAGNOSIS — Z1389 Encounter for screening for other disorder: Secondary | ICD-10-CM | POA: Diagnosis not present

## 2020-02-14 DIAGNOSIS — Z0000 Encounter for general adult medical examination without abnormal findings: Secondary | ICD-10-CM | POA: Diagnosis not present

## 2020-02-14 DIAGNOSIS — I5032 Chronic diastolic (congestive) heart failure: Secondary | ICD-10-CM | POA: Diagnosis not present

## 2020-02-14 DIAGNOSIS — H903 Sensorineural hearing loss, bilateral: Secondary | ICD-10-CM | POA: Diagnosis not present

## 2020-02-14 DIAGNOSIS — Z23 Encounter for immunization: Secondary | ICD-10-CM | POA: Diagnosis not present

## 2020-02-14 DIAGNOSIS — I10 Essential (primary) hypertension: Secondary | ICD-10-CM | POA: Diagnosis not present

## 2020-02-14 DIAGNOSIS — E441 Mild protein-calorie malnutrition: Secondary | ICD-10-CM | POA: Diagnosis not present

## 2020-02-16 DIAGNOSIS — Z1211 Encounter for screening for malignant neoplasm of colon: Secondary | ICD-10-CM | POA: Diagnosis not present

## 2020-02-17 DIAGNOSIS — L570 Actinic keratosis: Secondary | ICD-10-CM | POA: Diagnosis not present

## 2020-02-17 DIAGNOSIS — L219 Seborrheic dermatitis, unspecified: Secondary | ICD-10-CM | POA: Diagnosis not present

## 2020-02-27 ENCOUNTER — Other Ambulatory Visit: Payer: Self-pay | Admitting: Family Medicine

## 2020-02-27 DIAGNOSIS — Z1231 Encounter for screening mammogram for malignant neoplasm of breast: Secondary | ICD-10-CM

## 2020-02-27 DIAGNOSIS — E2839 Other primary ovarian failure: Secondary | ICD-10-CM

## 2020-03-27 ENCOUNTER — Other Ambulatory Visit: Payer: Self-pay | Admitting: Physician Assistant

## 2020-03-27 DIAGNOSIS — R197 Diarrhea, unspecified: Secondary | ICD-10-CM | POA: Diagnosis not present

## 2020-03-27 DIAGNOSIS — R1310 Dysphagia, unspecified: Secondary | ICD-10-CM | POA: Diagnosis not present

## 2020-03-30 ENCOUNTER — Ambulatory Visit: Payer: Medicare PPO | Attending: Internal Medicine

## 2020-03-30 DIAGNOSIS — Z23 Encounter for immunization: Secondary | ICD-10-CM

## 2020-03-30 NOTE — Progress Notes (Signed)
   Covid-19 Vaccination Clinic  Name:  Dana Hall    MRN: [id number] DOB: 1941/01/02  03/30/2020  [name] was observed post Covid-19 immunization for 15 minutes without incident. She was provided with Vaccine Information Sheet and instruction to access the V-Safe system.   [name] was instructed to call 911 with any severe reactions post vaccine: Marland Kitchen Difficulty breathing  . Swelling of face and throat  . A fast heartbeat  . A bad rash all over body  . Dizziness and weakness   Immunizations Administered    Name Date Dose VIS Date Route   Pfizer COVID-19 Vaccine 03/30/2020  1:35 PM 0.3 mL 02/01/2020 Intramuscular   Manufacturer: [employer], [employer]   Lot: [id number]   NDC: [address]-9

## 2020-04-10 ENCOUNTER — Ambulatory Visit
Admission: RE | Admit: 2020-04-10 | Discharge: 2020-04-10 | Disposition: A | Discharge status: Home / Self Care | Payer: Medicare PPO | Source: Ambulatory Visit | Attending: Physician Assistant | Admitting: Physician Assistant

## 2020-04-10 ENCOUNTER — Other Ambulatory Visit: Payer: Self-pay

## 2020-04-10 DIAGNOSIS — K862 Cyst of pancreas: Secondary | ICD-10-CM | POA: Diagnosis not present

## 2020-04-10 DIAGNOSIS — R197 Diarrhea, unspecified: Secondary | ICD-10-CM | POA: Diagnosis not present

## 2020-04-10 DIAGNOSIS — N281 Cyst of kidney, acquired: Secondary | ICD-10-CM | POA: Diagnosis not present

## 2020-04-10 DIAGNOSIS — K8689 Other specified diseases of pancreas: Secondary | ICD-10-CM | POA: Diagnosis not present

## 2020-04-10 MED ORDER — IOPAMIDOL (ISOVUE-300) INJECTION 61%
100.0000 mL | Freq: Once | INTRAVENOUS | Status: AC | PRN
  Administered 2020-04-10: 100 mL via INTRAVENOUS

## 2020-04-24 DIAGNOSIS — E611 Iron deficiency: Secondary | ICD-10-CM | POA: Diagnosis not present

## 2020-04-24 DIAGNOSIS — K5900 Constipation, unspecified: Secondary | ICD-10-CM | POA: Diagnosis not present

## 2020-04-27 DIAGNOSIS — H903 Sensorineural hearing loss, bilateral: Secondary | ICD-10-CM | POA: Diagnosis not present

## 2020-05-01 DIAGNOSIS — E611 Iron deficiency: Secondary | ICD-10-CM | POA: Diagnosis not present

## 2020-05-10 DIAGNOSIS — L578 Other skin changes due to chronic exposure to nonionizing radiation: Secondary | ICD-10-CM | POA: Diagnosis not present

## 2020-05-10 DIAGNOSIS — L821 Other seborrheic keratosis: Secondary | ICD-10-CM | POA: Diagnosis not present

## 2020-05-10 DIAGNOSIS — L570 Actinic keratosis: Secondary | ICD-10-CM | POA: Diagnosis not present

## 2020-05-10 DIAGNOSIS — D225 Melanocytic nevi of trunk: Secondary | ICD-10-CM | POA: Diagnosis not present

## 2020-06-04 ENCOUNTER — Ambulatory Visit
Admission: RE | Admit: 2020-06-04 | Discharge: 2020-06-04 | Disposition: A | Discharge status: Home / Self Care | Payer: Medicare PPO | Source: Ambulatory Visit | Attending: Family Medicine | Admitting: Family Medicine

## 2020-06-04 ENCOUNTER — Other Ambulatory Visit: Payer: Self-pay

## 2020-06-04 ENCOUNTER — Other Ambulatory Visit: Payer: Medicare PPO

## 2020-06-04 DIAGNOSIS — Z1231 Encounter for screening mammogram for malignant neoplasm of breast: Secondary | ICD-10-CM | POA: Diagnosis not present

## 2020-06-04 DIAGNOSIS — Z13820 Encounter for screening for osteoporosis: Secondary | ICD-10-CM | POA: Diagnosis not present

## 2020-06-04 DIAGNOSIS — E2839 Other primary ovarian failure: Secondary | ICD-10-CM

## 2020-06-04 DIAGNOSIS — Z780 Asymptomatic menopausal state: Secondary | ICD-10-CM | POA: Diagnosis not present

## 2020-07-29 ENCOUNTER — Encounter: Payer: Self-pay | Admitting: Cardiovascular Disease

## 2020-07-29 NOTE — Progress Notes (Signed)
Cardiology Office Note:    Date:  07/30/2020   ID:  IVA MONTELONGO, DOB 09-11-1940, MRN [id number]  PCP:  [name], MD  Cardiologist:    Lachina Salsberry   Electrophysiologist:  None   Referring MD: [name], MD   Chief Complaint  Patient presents with  . Hypertension    Oct. 12, 2020    Dana Hall is a 80 y.o. female with a hx of HTN.  She has had some dizziness I saw her via telemedicine visit in July, 2020 and thought she had orthostatic hypotension. Echo showed normal LV systolic function and grade 2 diastolic dysfunction She has mild Aortic insufficiency  Overall is feeling well BP is a bit high today .   Has been rushing around this am.  Takes her BP at home and typically gets 120's   Remains active .    July 30, 2020 Dana Hall is seen for follow up of her HTN  No cardiac complaints  Works on the treadmill at [employer]   No CP or dyspnea   Past Medical History:  Diagnosis Date  . Actinic keratosis    Dr.Gould  . Anemia   . Asthma    as a child  . BPPV (benign paroxysmal positional vertigo), unspecified laterality   . DDD (degenerative disc disease), lumbar    L5 facet arthritis and foraminal stenosis  . Degenerative cervical disc    spondylosis  . GERD (gastroesophageal reflux disease)   . Hypercholesterolemia   . Hypertension 11/21/2011  . SBO (small bowel obstruction) ([facility]) 11/29/2011  . Sensorineural hearing loss (SNHL) of both ears   . Strangulation obstruction of intestine ([facility]) 01/26/2012  . Strangulation of small intestine ([facility]) 11/20/2011    Past Surgical History:  Procedure Laterality Date  . APPENDECTOMY    . LAPAROTOMY  11/20/2011   Procedure: SB resection & repair of hernia.  EXPLORATORY LAPAROTOMY;  Surgeon: Ernestene Mention, MD;  Location: [facility];  Service: General;  Laterality: N/A;  exploratory laparotomy for bowel obstruction  . MULTIPLE TOOTH EXTRACTIONS     age 50    Current Medications: Current Meds  Medication Sig  .  amLODipine (NORVASC) 5 MG tablet Take 5 mg by mouth daily.  Marland Kitchen lisinopril (ZESTRIL) 20 MG tablet Take 20 mg by mouth daily.     Allergies:   Patient has no known allergies.   Social History   Socioeconomic History  . Marital status: Married    Spouse name: Not on file  . Number of children: 3  . Years of education: Not on file  . Highest education level: Not on file  Occupational History  . Occupation: retired Systems developer  Tobacco Use  . Smoking status: Never Smoker  . Smokeless tobacco: Never Used  Vaping Use  . Vaping Use: Never used  Substance and Sexual Activity  . Alcohol use: Yes  . Drug use: No  . Sexual activity: Not on file  Other Topics Concern  . Not on file  Social History Narrative  . Not on file   Social Determinants of Health   Financial Resource Strain: Not on file  Food Insecurity: Not on file  Transportation Needs: Not on file  Physical Activity: Not on file  Stress: Not on file  Social Connections: Not on file     Family History: The patient's family history includes Heart disease in her mother. There is no history of Breast cancer.  ROS:   Please see the history  of present illness.     All other systems reviewed and are negative.  EKGs/Labs/Other Studies Reviewed:    The following studies were reviewed today:   EKG:  April . 18, 2022:    NSR at 70.  NS IVCD.  Poor R wave progression    Recent Labs: No results found for requested labs within last 8760 hours.  Recent Lipid Panel No results found for: CHOL, TRIG, HDL, CHOLHDL, VLDL, LDLCALC, LDLDIRECT  Physical Exam:    Physical Exam: Blood pressure 132/68, pulse 70, height 5\' 7"  (1.702 m), weight 128 lb (58.1 kg), SpO2 98 %.  GEN:  Well nourished, well developed in no acute distress HEENT: Normal NECK: No JVD; No carotid bruits LYMPHATICS: No lymphadenopathy CARDIAC: RRR , no murmurs, rubs, gallops RESPIRATORY:  Clear to auscultation without rales, wheezing or rhonchi   ABDOMEN: Soft, non-tender, non-distended MUSCULOSKELETAL:  No edema; No deformity  SKIN: Warm and dry NEUROLOGIC:  Alert and oriented x 3    ASSESSMENT:    1. Essential hypertension   2. Primary hypertension    PLAN:      1. HTN:    Doing well .  Cont current meds.   2.  Chronic diastolic congestive heart failure:  Well controlled.   Watches her salt   3.  Mild aortic insufficiency:  Very mild   We will see her in 2 years   Medication Adjustments/Labs and Tests Ordered: Current medicines are reviewed at length with the patient today.  Concerns regarding medicines are outlined above.  Orders Placed This Encounter  Procedures  . EKG 12-Lead   No orders of the defined types were placed in this encounter.   Patient Instructions  Medication Instructions:  Your physician recommends that you continue on your current medications as directed. Please refer to the Current Medication list given to you today.  *If you need a refill on your cardiac medications before your next appointment, please call your pharmacy*   Lab Work: none If you have labs (blood work) drawn today and your tests are completely normal, you will receive your results only by: MyChart Message (if you have MyChart) OR . A paper copy in the mail If you have any lab test that is abnormal or we need to change your treatment, we will call you to review the results.   Testing/Procedures: none   Follow-Up: At [facility], you and your health needs are our priority.  As part of our continuing mission to provide you with exceptional heart care, we have created designated Provider Care Teams.  These Care Teams include your primary Cardiologist (physician) and Advanced Practice Providers (APPs -  Physician Assistants and Nurse Practitioners) who all work together to provide you with the care you need, when you need it.  Your next appointment:   2 year(s)  The format for your next appointment:   In  Person  Provider:   You may see [facility] [place], MD or one of the following Advanced Practice Providers on your designated Care Team:    Kristeen Miss, PA-C  Tereso Newcomer, Chelsea Aus    Other Instructions none     Signed, [place], MD  07/30/2020 10:32 AM    [facility]

## 2020-07-30 ENCOUNTER — Other Ambulatory Visit: Payer: Self-pay

## 2020-07-30 ENCOUNTER — Encounter: Payer: Self-pay | Admitting: Cardiovascular Disease

## 2020-07-30 ENCOUNTER — Ambulatory Visit: Payer: Medicare PPO | Admitting: Cardiovascular Disease

## 2020-07-30 VITALS — BP 132/68 | HR 70 | Ht 67.0 in | Wt 128.0 lb

## 2020-07-30 DIAGNOSIS — I10 Essential (primary) hypertension: Secondary | ICD-10-CM | POA: Diagnosis not present

## 2020-07-30 NOTE — Patient Instructions (Signed)
Medication Instructions:  Your physician recommends that you continue on your current medications as directed. Please refer to the Current Medication list given to you today.  *If you need a refill on your cardiac medications before your next appointment, please call your pharmacy*   Lab Work: none If you have labs (blood work) drawn today and your tests are completely normal, you will receive your results only by: Marland Kitchen MyChart Message (if you have MyChart) OR . A paper copy in the mail If you have any lab test that is abnormal or we need to change your treatment, we will call you to review the results.   Testing/Procedures: none   Follow-Up: At [facility], you and your health needs are our priority.  As part of our continuing mission to provide you with exceptional heart care, we have created designated Provider Care Teams.  These Care Teams include your primary Cardiologist (physician) and Advanced Practice Providers (APPs -  Physician Assistants and Nurse Practitioners) who all work together to provide you with the care you need, when you need it.  Your next appointment:   2 year(s)  The format for your next appointment:   In Person  Provider:   You may see Kristeen Miss, MD or one of the following Advanced Practice Providers on your designated Care Team:    Tereso Newcomer, PA-C  Chelsea Aus, [place]    Other Instructions none

## 2020-09-18 DIAGNOSIS — H5310 Unspecified subjective visual disturbances: Secondary | ICD-10-CM | POA: Diagnosis not present

## 2020-09-25 DIAGNOSIS — J209 Acute bronchitis, unspecified: Secondary | ICD-10-CM | POA: Diagnosis not present

## 2020-10-05 DIAGNOSIS — H5310 Unspecified subjective visual disturbances: Secondary | ICD-10-CM | POA: Diagnosis not present

## 2020-11-15 DIAGNOSIS — H903 Sensorineural hearing loss, bilateral: Secondary | ICD-10-CM | POA: Diagnosis not present

## 2020-11-15 DIAGNOSIS — H6121 Impacted cerumen, right ear: Secondary | ICD-10-CM | POA: Diagnosis not present

## 2021-01-21 DIAGNOSIS — Z961 Presence of intraocular lens: Secondary | ICD-10-CM | POA: Diagnosis not present

## 2021-01-21 DIAGNOSIS — H52203 Unspecified astigmatism, bilateral: Secondary | ICD-10-CM | POA: Diagnosis not present

## 2021-02-01 DIAGNOSIS — M25552 Pain in left hip: Secondary | ICD-10-CM | POA: Diagnosis not present

## 2021-02-01 DIAGNOSIS — M5136 Other intervertebral disc degeneration, lumbar region: Secondary | ICD-10-CM | POA: Diagnosis not present

## 2021-02-02 ENCOUNTER — Other Ambulatory Visit: Payer: Self-pay

## 2021-02-02 ENCOUNTER — Inpatient Hospital Stay (HOSPITAL_COMMUNITY)
Admission: EM | Admit: 2021-02-02 | Discharge: 2021-02-13 | DRG: 335 | Disposition: A | Discharge status: Home / Self Care | Payer: Medicare PPO | Attending: Internal Medicine | Admitting: Internal Medicine

## 2021-02-02 ENCOUNTER — Inpatient Hospital Stay (HOSPITAL_COMMUNITY): Payer: Medicare PPO

## 2021-02-02 ENCOUNTER — Emergency Department (HOSPITAL_COMMUNITY): Payer: Medicare PPO

## 2021-02-02 DIAGNOSIS — I2694 Multiple subsegmental pulmonary emboli without acute cor pulmonale: Secondary | ICD-10-CM | POA: Diagnosis not present

## 2021-02-02 DIAGNOSIS — I5033 Acute on chronic diastolic (congestive) heart failure: Secondary | ICD-10-CM | POA: Diagnosis not present

## 2021-02-02 DIAGNOSIS — Z79899 Other long term (current) drug therapy: Secondary | ICD-10-CM | POA: Diagnosis not present

## 2021-02-02 DIAGNOSIS — K219 Gastro-esophageal reflux disease without esophagitis: Secondary | ICD-10-CM | POA: Diagnosis not present

## 2021-02-02 DIAGNOSIS — J9690 Respiratory failure, unspecified, unspecified whether with hypoxia or hypercapnia: Secondary | ICD-10-CM | POA: Diagnosis not present

## 2021-02-02 DIAGNOSIS — I2699 Other pulmonary embolism without acute cor pulmonale: Secondary | ICD-10-CM | POA: Diagnosis not present

## 2021-02-02 DIAGNOSIS — J9601 Acute respiratory failure with hypoxia: Secondary | ICD-10-CM

## 2021-02-02 DIAGNOSIS — J918 Pleural effusion in other conditions classified elsewhere: Secondary | ICD-10-CM | POA: Diagnosis not present

## 2021-02-02 DIAGNOSIS — K567 Ileus, unspecified: Secondary | ICD-10-CM | POA: Diagnosis not present

## 2021-02-02 DIAGNOSIS — Z20822 Contact with and (suspected) exposure to covid-19: Secondary | ICD-10-CM | POA: Diagnosis not present

## 2021-02-02 DIAGNOSIS — I110 Hypertensive heart disease with heart failure: Secondary | ICD-10-CM | POA: Diagnosis present

## 2021-02-02 DIAGNOSIS — Z8249 Family history of ischemic heart disease and other diseases of the circulatory system: Secondary | ICD-10-CM | POA: Diagnosis not present

## 2021-02-02 DIAGNOSIS — R0902 Hypoxemia: Secondary | ICD-10-CM | POA: Diagnosis not present

## 2021-02-02 DIAGNOSIS — K56609 Unspecified intestinal obstruction, unspecified as to partial versus complete obstruction: Secondary | ICD-10-CM

## 2021-02-02 DIAGNOSIS — E871 Hypo-osmolality and hyponatremia: Secondary | ICD-10-CM | POA: Diagnosis not present

## 2021-02-02 DIAGNOSIS — R17 Unspecified jaundice: Secondary | ICD-10-CM | POA: Diagnosis present

## 2021-02-02 DIAGNOSIS — I5031 Acute diastolic (congestive) heart failure: Secondary | ICD-10-CM | POA: Diagnosis not present

## 2021-02-02 DIAGNOSIS — E876 Hypokalemia: Secondary | ICD-10-CM | POA: Diagnosis not present

## 2021-02-02 DIAGNOSIS — R1032 Left lower quadrant pain: Secondary | ICD-10-CM | POA: Diagnosis not present

## 2021-02-02 DIAGNOSIS — Z9049 Acquired absence of other specified parts of digestive tract: Secondary | ICD-10-CM | POA: Diagnosis not present

## 2021-02-02 DIAGNOSIS — R0602 Shortness of breath: Secondary | ICD-10-CM | POA: Diagnosis not present

## 2021-02-02 DIAGNOSIS — I2609 Other pulmonary embolism with acute cor pulmonale: Secondary | ICD-10-CM | POA: Diagnosis not present

## 2021-02-02 DIAGNOSIS — I4892 Unspecified atrial flutter: Secondary | ICD-10-CM

## 2021-02-02 DIAGNOSIS — Z9889 Other specified postprocedural states: Secondary | ICD-10-CM

## 2021-02-02 DIAGNOSIS — E8770 Fluid overload, unspecified: Secondary | ICD-10-CM

## 2021-02-02 DIAGNOSIS — E7800 Pure hypercholesterolemia, unspecified: Secondary | ICD-10-CM | POA: Diagnosis present

## 2021-02-02 DIAGNOSIS — I483 Typical atrial flutter: Secondary | ICD-10-CM | POA: Diagnosis not present

## 2021-02-02 DIAGNOSIS — R899 Unspecified abnormal finding in specimens from other organs, systems and tissues: Secondary | ICD-10-CM

## 2021-02-02 DIAGNOSIS — R079 Chest pain, unspecified: Secondary | ICD-10-CM | POA: Diagnosis not present

## 2021-02-02 DIAGNOSIS — R739 Hyperglycemia, unspecified: Secondary | ICD-10-CM | POA: Diagnosis present

## 2021-02-02 DIAGNOSIS — I10 Essential (primary) hypertension: Secondary | ICD-10-CM | POA: Diagnosis not present

## 2021-02-02 DIAGNOSIS — J90 Pleural effusion, not elsewhere classified: Secondary | ICD-10-CM

## 2021-02-02 DIAGNOSIS — K6389 Other specified diseases of intestine: Secondary | ICD-10-CM | POA: Diagnosis not present

## 2021-02-02 DIAGNOSIS — Z4682 Encounter for fitting and adjustment of non-vascular catheter: Secondary | ICD-10-CM | POA: Diagnosis not present

## 2021-02-02 DIAGNOSIS — R0603 Acute respiratory distress: Secondary | ICD-10-CM | POA: Diagnosis not present

## 2021-02-02 DIAGNOSIS — R1031 Right lower quadrant pain: Secondary | ICD-10-CM | POA: Diagnosis not present

## 2021-02-02 DIAGNOSIS — K5939 Other megacolon: Secondary | ICD-10-CM | POA: Diagnosis not present

## 2021-02-02 DIAGNOSIS — K565 Intestinal adhesions [bands], unspecified as to partial versus complete obstruction: Secondary | ICD-10-CM | POA: Diagnosis not present

## 2021-02-02 DIAGNOSIS — R109 Unspecified abdominal pain: Secondary | ICD-10-CM | POA: Diagnosis not present

## 2021-02-02 DIAGNOSIS — D72829 Elevated white blood cell count, unspecified: Secondary | ICD-10-CM | POA: Diagnosis present

## 2021-02-02 DIAGNOSIS — J9811 Atelectasis: Secondary | ICD-10-CM | POA: Diagnosis not present

## 2021-02-02 DIAGNOSIS — K5652 Intestinal adhesions [bands] with complete obstruction: Secondary | ICD-10-CM | POA: Diagnosis not present

## 2021-02-02 DIAGNOSIS — I4891 Unspecified atrial fibrillation: Secondary | ICD-10-CM | POA: Diagnosis not present

## 2021-02-02 DIAGNOSIS — I509 Heart failure, unspecified: Secondary | ICD-10-CM | POA: Diagnosis not present

## 2021-02-02 DIAGNOSIS — I2602 Saddle embolus of pulmonary artery with acute cor pulmonale: Secondary | ICD-10-CM | POA: Diagnosis not present

## 2021-02-02 DIAGNOSIS — Z0189 Encounter for other specified special examinations: Secondary | ICD-10-CM

## 2021-02-02 DIAGNOSIS — R112 Nausea with vomiting, unspecified: Secondary | ICD-10-CM | POA: Diagnosis not present

## 2021-02-02 DIAGNOSIS — E860 Dehydration: Secondary | ICD-10-CM | POA: Diagnosis present

## 2021-02-02 DIAGNOSIS — K5651 Intestinal adhesions [bands], with partial obstruction: Secondary | ICD-10-CM | POA: Diagnosis not present

## 2021-02-02 LAB — CBC WITH DIFFERENTIAL/PLATELET
Abs Immature Granulocytes: 0.05 10*3/uL (ref 0.00–0.07)
Basophils Absolute: 0 10*3/uL (ref 0.0–0.1)
Basophils Relative: 0 %
Eosinophils Absolute: 0 10*3/uL (ref 0.0–0.5)
Eosinophils Relative: 0 %
HCT: 45.1 % (ref 36.0–46.0)
Hemoglobin: 15 g/dL (ref 12.0–15.0)
Immature Granulocytes: 0 %
Lymphocytes Relative: 4 %
Lymphs Abs: 0.8 10*3/uL (ref 0.7–4.0)
MCH: 31.2 pg (ref 26.0–34.0)
MCHC: 33.3 g/dL (ref 30.0–36.0)
MCV: 93.8 fL (ref 80.0–100.0)
Monocytes Absolute: 1.4 10*3/uL — ABNORMAL HIGH (ref 0.1–1.0)
Monocytes Relative: 7 %
Neutro Abs: 17.1 10*3/uL — ABNORMAL HIGH (ref 1.7–7.7)
Neutrophils Relative %: 89 %
Platelets: 341 10*3/uL (ref 150–400)
RBC: 4.81 MIL/uL (ref 3.87–5.11)
RDW: 12.2 % (ref 11.5–15.5)
WBC: 19.3 10*3/uL — ABNORMAL HIGH (ref 4.0–10.5)
nRBC: 0 % (ref 0.0–0.2)

## 2021-02-02 LAB — COMPREHENSIVE METABOLIC PANEL
ALT: 25 U/L (ref 0–44)
AST: 29 U/L (ref 15–41)
Albumin: 4.6 g/dL (ref 3.5–5.0)
Alkaline Phosphatase: 64 U/L (ref 38–126)
Anion gap: 11 (ref 5–15)
BUN: 22 mg/dL (ref 8–23)
CO2: 28 mmol/L (ref 22–32)
Calcium: 10.4 mg/dL — ABNORMAL HIGH (ref 8.9–10.3)
Chloride: 93 mmol/L — ABNORMAL LOW (ref 98–111)
Creatinine, Ser: 0.78 mg/dL (ref 0.44–1.00)
GFR, Estimated: >60 mL/min (ref >60)
Glucose, Bld: 163 mg/dL — ABNORMAL HIGH (ref 70–99)
Potassium: 4.8 mmol/L (ref 3.5–5.1)
Sodium: 132 mmol/L — ABNORMAL LOW (ref 135–145)
Total Bilirubin: 1.3 mg/dL — ABNORMAL HIGH (ref 0.3–1.2)
Total Protein: 8.4 g/dL — ABNORMAL HIGH (ref 6.5–8.1)

## 2021-02-02 LAB — RESP PANEL BY RT-PCR (FLU A&B, COVID) ARPGX2
Influenza A by PCR: NEGATIVE
Influenza B by PCR: NEGATIVE
SARS Coronavirus 2 by RT PCR: NEGATIVE

## 2021-02-02 LAB — LIPASE, BLOOD: Lipase: 48 U/L (ref 11–51)

## 2021-02-02 LAB — LACTIC ACID, PLASMA
Lactic Acid, Venous: 1.8 mmol/L (ref 0.5–1.9)
Lactic Acid, Venous: 1.7 mmol/L (ref 0.5–1.9)

## 2021-02-02 MED ORDER — HYDRALAZINE HCL 20 MG/ML IJ SOLN: 5.0000 mg | Freq: Four times a day (QID) | INTRAMUSCULAR | Status: DC | PRN

## 2021-02-02 MED ORDER — IOHEXOL 300 MG/ML  SOLN
100.0000 mL | Freq: Once | INTRAMUSCULAR | Status: AC | PRN
  Administered 2021-02-02: 100 mL via INTRAVENOUS

## 2021-02-02 MED ORDER — ENOXAPARIN SODIUM 40 MG/0.4ML IJ SOSY
40.0000 mg | PREFILLED_SYRINGE | INTRAMUSCULAR | Status: DC
  Administered 2021-02-02 – 2021-02-08 (×6): 40 mg via SUBCUTANEOUS
  Filled 2021-02-02 (×7): qty 0.4

## 2021-02-02 MED ORDER — ONDANSETRON HCL 4 MG/2ML IJ SOLN
4.0000 mg | Freq: Four times a day (QID) | INTRAMUSCULAR | Status: DC | PRN
  Administered 2021-02-03: 4 mg via INTRAVENOUS
  Filled 2021-02-02: qty 2

## 2021-02-02 MED ORDER — ONDANSETRON HCL 4 MG/2ML IJ SOLN
4.0000 mg | Freq: Once | INTRAMUSCULAR | Status: AC
  Administered 2021-02-02: 4 mg via INTRAVENOUS
  Filled 2021-02-02: qty 2

## 2021-02-02 MED ORDER — FENTANYL CITRATE PF 50 MCG/ML IJ SOSY
50.0000 ug | PREFILLED_SYRINGE | Freq: Once | INTRAMUSCULAR | Status: AC
  Administered 2021-02-02: 50 ug via INTRAVENOUS
  Filled 2021-02-02: qty 1

## 2021-02-02 MED ORDER — DIATRIZOATE MEGLUMINE & SODIUM 66-10 % PO SOLN
90.0000 mL | Freq: Once | ORAL | Status: AC
  Administered 2021-02-02: 90 mL via NASOGASTRIC
  Filled 2021-02-02: qty 90

## 2021-02-02 MED ORDER — SODIUM CHLORIDE 0.9 % IV SOLN: INTRAVENOUS | Status: AC

## 2021-02-02 MED ORDER — ONDANSETRON HCL 4 MG PO TABS: 4.0000 mg | ORAL_TABLET | Freq: Four times a day (QID) | ORAL | Status: DC | PRN

## 2021-02-02 MED ORDER — MORPHINE SULFATE (PF) 2 MG/ML IV SOLN
2.0000 mg | INTRAVENOUS | Status: DC | PRN
  Administered 2021-02-06 – 2021-02-10 (×3): 2 mg via INTRAVENOUS
  Filled 2021-02-02 (×3): qty 1

## 2021-02-02 MED ORDER — HYDROMORPHONE HCL 1 MG/ML IJ SOLN
0.5000 mg | INTRAMUSCULAR | Status: DC | PRN
  Administered 2021-02-02 – 2021-02-04 (×7): 1 mg via INTRAVENOUS
  Administered 2021-02-04: 0.5 mg via INTRAVENOUS
  Administered 2021-02-05 – 2021-02-10 (×4): 1 mg via INTRAVENOUS
  Filled 2021-02-02 (×12): qty 1

## 2021-02-02 NOTE — H&P (Signed)
History and Physical    Dana Hall IFO:[id number] DOB: 1940-06-28 DOA: 02/02/2021  PCP: [name], MD (Confirm with patient/family/NH records and if not entered, this has to be entered at [facility] point of entry) Patient coming from: Home  I have personally briefly reviewed patient's old medical records in [facility]  Chief Complaint: Abd pain  HPI: Dana Hall is a 80 y.o. female with medical history significant of Sbo status post partial small bowel resection, HTN, came with new onset abdominal pain.  Symptoms started yesterday, with cramping-like lower abdominal pain, and feeling nauseous and vomited stomach content several times, nonbloody nonbilious.  Patient had small bowel movement this morning, and abdominal pain became constant and more severe.  And she went to see her PCP, who recommended she come into the ED.  No fever chills, no shortness of breath or chest pains.  ED Course: CT abdomen showed high-grade small bowel obstruction to the level of transection point in the mid pelvis.,  Creatinine 0.7, K4.8, CA 10.4.  Review of Systems: As per HPI otherwise 14 point review of systems negative.   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  EXPLORATORY LAPAROTOMY;   Surgeon: Ernestene Mention, MD;  Location: [facility];  Service: General;  Laterality: N/A;  exploratory laparotomy for bowel obstruction   MULTIPLE TOOTH EXTRACTIONS     age 43     reports that she has never smoked. She has never used smokeless tobacco. She reports current alcohol use. She reports that she does not use drugs.  No Known Allergies  Family History  Problem Relation Age of Onset   Heart disease Mother    Breast cancer Neg Hx      Prior to Admission medications   Medication Sig Start Date End Date Taking? Authorizing Provider  amLODipine (NORVASC) 5 MG tablet Take 5 mg by mouth daily.    [provider]  lisinopril (ZESTRIL) 20 MG tablet Take 20 mg by mouth daily.    [provider]    Physical Exam: Vitals:   02/02/21 1300 02/02/21 1315 02/02/21 1330 02/02/21 1400  BP: (!) 153/71 (!) 159/79 (!) 153/72 (!) 163/78  Pulse: 80 85 79 84  Resp: 14   (!) 21  Temp:      TempSrc:      SpO2: 98% 99% 98% 99%  Weight:      Height:        Constitutional: NAD, calm, comfortable Vitals:   02/02/21 1300 02/02/21 1315 02/02/21 1330 02/02/21 1400  BP: (!) 153/71 (!) 159/79 (!) 153/72 (!) 163/78  Pulse: 80 85 79 84  Resp: 14   (!) 21  Temp:      TempSrc:  SpO2: 98% 99% 98% 99%  Weight:      Height:       Eyes: PERRL, lids and conjunctivae normal ENMT: Mucous membranes are dry. Posterior pharynx clear of any exudate or lesions.Normal dentition.  Neck: normal, supple, no masses, no thyromegaly Respiratory: clear to auscultation bilaterally, no wheezing, no crackles. Normal respiratory effort. No accessory muscle use.  Cardiovascular: Regular rate and rhythm, no murmurs / rubs / gallops. No extremity edema. 2+ pedal pulses. No carotid bruits.  Abdomen: Distended, diffused tenderness, no masses palpated. No hepatosplenomegaly.  Reduced bowel sounds.  Musculoskeletal: no clubbing / cyanosis. No joint deformity upper and lower extremities. Good ROM, no  contractures. Normal muscle tone.  Skin: no rashes, lesions, ulcers. No induration Neurologic: CN 2-12 grossly intact. Sensation intact, DTR normal. Strength 5/5 in all 4.  Psychiatric: Normal judgment and insight. Alert and oriented x 3. Normal mood.     Labs on Admission: I have personally reviewed following labs and imaging studies  CBC: Recent Labs  Lab 02/02/21 1148  WBC 19.3*  NEUTROABS 17.1*  HGB 15.0  HCT 45.1  MCV 93.8  PLT 341   Basic Metabolic Panel: Recent Labs  Lab 02/02/21 1148  NA 132*  K 4.8  CL 93*  CO2 28  GLUCOSE 163*  BUN 22  CREATININE 0.78  CALCIUM 10.4*   GFR: Estimated Creatinine Clearance: 49.6 mL/min (by C-G formula based on SCr of 0.78 mg/dL). Liver Function Tests: Recent Labs  Lab 02/02/21 1148  AST 29  ALT 25  ALKPHOS 64  BILITOT 1.3*  PROT 8.4*  ALBUMIN 4.6   Recent Labs  Lab 02/02/21 1148  LIPASE 48   No results for input(s): AMMONIA in the last 168 hours. Coagulation Profile: No results for input(s): INR, PROTIME in the last 168 hours. Cardiac Enzymes: No results for input(s): CKTOTAL, CKMB, CKMBINDEX, TROPONINI in the last 168 hours. BNP (last 3 results) No results for input(s): PROBNP in the last 8760 hours. HbA1C: No results for input(s): HGBA1C in the last 72 hours. CBG: No results for input(s): GLUCAP in the last 168 hours. Lipid Profile: No results for input(s): CHOL, HDL, LDLCALC, TRIG, CHOLHDL, LDLDIRECT in the last 72 hours. Thyroid Function Tests: No results for input(s): TSH, T4TOTAL, FREET4, T3FREE, THYROIDAB in the last 72 hours. Anemia Panel: No results for input(s): VITAMINB12, FOLATE, FERRITIN, TIBC, IRON, RETICCTPCT in the last 72 hours. Urine analysis:    Component Value Date/Time   COLORURINE AMBER (A) 11/19/2011 2034   APPEARANCEUR CLEAR 11/19/2011 2034   LABSPEC 1.037 (H) 11/19/2011 2034   PHURINE 6.0 11/19/2011 2034   GLUCOSEU NEGATIVE 11/19/2011 2034   HGBUR NEGATIVE 11/19/2011 2034    BILIRUBINUR NEGATIVE 11/19/2011 2034   KETONESUR NEGATIVE 11/19/2011 2034   PROTEINUR 30 (A) 11/19/2011 2034   UROBILINOGEN 0.2 11/19/2011 2034   NITRITE NEGATIVE 11/19/2011 2034   LEUKOCYTESUR NEGATIVE 11/19/2011 2034    Radiological Exams on Admission: CT Abdomen Pelvis W Contrast  Result Date: 02/02/2021 CLINICAL DATA:  Abdominal pain, tenderness EXAM: CT ABDOMEN AND PELVIS WITH CONTRAST TECHNIQUE: Multidetector CT imaging of the abdomen and pelvis was performed using the standard protocol following bolus administration of intravenous contrast. CONTRAST:  OMNIPAQUE IOHEXOL 300 MG/ML  SOLN COMPARISON:  04/10/2020 FINDINGS: Lower chest: No pleural or pericardial effusion. Fluid in the nondilated distal esophagus. Visualized lung bases normal. Hepatobiliary: No focal liver abnormality is seen. No gallstones, gallbladder wall thickening, or biliary dilatation. Pancreas: Unremarkable. No pancreatic ductal dilatation or surrounding  inflammatory changes. Spleen: Normal in size without focal abnormality. Adrenals/Urinary Tract: Normal adrenal glands. No urolithiasis hydronephrosis. Left parapelvic renal cysts as before. Urinary bladder nondistended. Stomach/Bowel: Gastric body and fundus distended by fluid. Duodenal periampullary diverticulum. Multiple dilated proximal and mid small bowel loops. Transition point in the pelvis. Distal ileal loops are decompressed through the terminal ileum. The colon is nondistended, unremarkable. Vascular/Lymphatic: Mild calcified aortic plaque without aneurysm. No abdominal or pelvic adenopathy. Reproductive: Uterus and bilateral adnexa are unremarkable. Other: Bilateral pelvic phleboliths.  No ascites.  No free air. Musculoskeletal: Spondylitic changes in the lower lumbar spine. No fracture or worrisome bone lesion. IMPRESSION: 1. High-grade distal small bowel obstruction, transition point in the mid pelvis. No associated mass or lesion is evident, suggesting  adhesions. 2.  Aortic Atherosclerosis (ICD10-170.0). Electronically Signed   By: Corlis Leak M.D.   On: 02/02/2021 13:58    EKG: Independently reviewed.   Assessment/Plan Active Problems:   SBO (small bowel obstruction) (HCC)  (please populate well all problems here in Problem List. (For example, if patient is on BP meds at home and you resume or decide to hold them, it is a problem that needs to be her. Same for CAD, COPD, HLD and so on)  SBO -NGT -IVF -Pain meds -Discussed with on-call surgery Dr. Bedelia Person, who will see the patient emergently.  Hypercalcemia -Probably from severe dehydration and hemoconcentration, IV fluid then reevaluate.  Leukocytosis -Likely reactive, send lactic acid.  HTN -Hold p.o. BP meds, as needed hydralazine. -Consider change amlodipine to avoid possible side effect of constipation from the CCB.  DVT prophylaxis: Lovenox Code Status: Full code Family Communication: Husband at bedside Disposition Plan: Expect more than 2 midnight hospital stay Consults called: General surgery Admission status: MedSurg admission with telemetry x24 hours   Emeline General MD [facility] Pager [phone]  02/02/2021, 3:28 PM

## 2021-02-02 NOTE — ED Triage Notes (Signed)
Pt here d/t abdominal pain. History of obstruction. Abdomin tender to touch, distended and hard. 10/10 pain. Pt endorses N/V.

## 2021-02-02 NOTE — Consult Note (Signed)
Reason for Consult/Chief Complaint: SBO Consultant: Fredderick Phenix, MD  Dana Hall is an 80 y.o. female.   HPI: 8F with a one day history of abdominal pain nausea, and multiple episodes of vomiting. She is extremely tangential and history is difficult to obtain due to this as well as her difficulty hearing. Husband is at bedside and attempts to assist, however patient does not allow him to contribute. Last BM this AM. Reports she is still having flatus. Prior history SBO requiring bowel resection 9y ago. H/o open appy.   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  EXPLORATORY LAPAROTOMY;  Surgeon: Ernestene Mention, MD;  Location: [facility];  Service: General;  Laterality: N/A;  exploratory laparotomy for bowel obstruction   MULTIPLE TOOTH EXTRACTIONS     age 73    Family History  Problem Relation Age of Onset   Heart disease Mother    Breast cancer Neg Hx     Social History:  reports that she has never smoked. She has never used smokeless tobacco. She reports current alcohol use. She reports that she does not use drugs.  Allergies: No Known Allergies  Medications: I have reviewed the patient's current medications.  Results for orders placed or performed during the hospital encounter  of 02/02/21 (from the past 48 hour(s))  CBC with Differential     Status: Abnormal   Collection Time: 02/02/21 11:48 AM  Result Value Ref Range   WBC 19.3 (H) 4.0 - 10.5 K/uL   RBC 4.81 3.87 - 5.11 MIL/uL   Hemoglobin 15.0 12.0 - 15.0 g/dL   HCT 96.2 83.6 - 62.9 %   MCV 93.8 80.0 - 100.0 fL   MCH 31.2 26.0 - 34.0 pg   MCHC 33.3 30.0 - 36.0 g/dL   RDW 47.6 54.6 - 50.3 %   Platelets 341 150 - 400 K/uL   nRBC 0.0 0.0 - 0.2 %   Neutrophils Relative % 89 %   Neutro Abs 17.1 (H) 1.7 - 7.7 K/uL   Lymphocytes Relative 4 %   Lymphs Abs 0.8 0.7 - 4.0 K/uL   Monocytes Relative 7 %   Monocytes Absolute 1.4 (H) 0.1 - 1.0 K/uL   Eosinophils Relative 0 %   Eosinophils Absolute 0.0 0.0 - 0.5 K/uL   Basophils Relative 0 %   Basophils Absolute 0.0 0.0 - 0.1 K/uL   Immature Granulocytes 0 %   Abs Immature Granulocytes 0.05 0.00 - 0.07 K/uL    Comment: Performed at [facility], [address] [place], [place] [address]  Comprehensive metabolic panel     Status: Abnormal   Collection Time: 02/02/21 11:48 AM  Result Value Ref Range   Sodium 132 (L)  135 - 145 mmol/L   Potassium 4.8 3.5 - 5.1 mmol/L   Chloride 93 (L) 98 - 111 mmol/L   CO2 28 22 - 32 mmol/L   Glucose, Bld 163 (H) 70 - 99 mg/dL    Comment: Glucose reference range applies only to samples taken after fasting for at least 8 hours.   BUN 22 8 - 23 mg/dL   Creatinine, Ser 8.67 0.44 - 1.00 mg/dL   Calcium 67.2 (H) 8.9 - 10.3 mg/dL   Total Protein 8.4 (H) 6.5 - 8.1 g/dL   Albumin 4.6 3.5 - 5.0 g/dL   AST 29 15 - 41 U/L   ALT 25 0 - 44 U/L   Alkaline Phosphatase 64 38 - 126 U/L   Total Bilirubin 1.3 (H) 0.3 - 1.2 mg/dL   GFR, Estimated >09 >47 mL/min    Comment: (NOTE) Calculated using the CKD-EPI Creatinine Equation (2021)    Anion gap 11 5 - 15    Comment: Performed at [facility], [address], [place], [place] [address]  Lipase, blood     Status: None   Collection Time: 02/02/21 11:48 AM  Result Value Ref  Range   Lipase 48 11 - 51 U/L    Comment: Performed at [facility], [address] [place], [place] [address]    CT Abdomen Pelvis W Contrast  Result Date: 02/02/2021 CLINICAL DATA:  Abdominal pain, tenderness EXAM: CT ABDOMEN AND PELVIS WITH CONTRAST TECHNIQUE: Multidetector CT imaging of the abdomen and pelvis was performed using the standard protocol following bolus administration of intravenous contrast. CONTRAST:  OMNIPAQUE IOHEXOL 300 MG/ML  SOLN COMPARISON:  04/10/2020 FINDINGS: Lower chest: No pleural or pericardial effusion. Fluid in the nondilated distal esophagus. Visualized lung bases normal. Hepatobiliary: No focal liver abnormality is seen. No gallstones, gallbladder wall thickening, or biliary dilatation. Pancreas: Unremarkable. No pancreatic ductal dilatation or surrounding inflammatory changes. Spleen: Normal in size without focal abnormality. Adrenals/Urinary Tract: Normal adrenal glands. No urolithiasis hydronephrosis. Left parapelvic renal cysts as before. Urinary bladder nondistended. Stomach/Bowel: Gastric body and fundus distended by fluid. Duodenal periampullary diverticulum. Multiple dilated proximal and mid small bowel loops. Transition point in the pelvis. Distal ileal loops are decompressed through the terminal ileum. The colon is nondistended, unremarkable. Vascular/Lymphatic: Mild calcified aortic plaque without aneurysm. No abdominal or pelvic adenopathy. Reproductive: Uterus and bilateral adnexa are unremarkable. Other: Bilateral pelvic phleboliths.  No ascites.  No free air. Musculoskeletal: Spondylitic changes in the lower lumbar spine. No fracture or worrisome bone lesion. IMPRESSION: 1. High-grade distal small bowel obstruction, transition point in the mid pelvis. No associated mass or lesion is evident, suggesting adhesions. 2.  Aortic Atherosclerosis (ICD10-170.0). Electronically Signed   By: Corlis Leak M.D.   On: 02/02/2021 13:58    ROS 10 point review  of systems is negative except as listed above in HPI.   Physical Exam Blood pressure (!) 163/78, pulse 84, temperature 98 F (36.7 C), temperature source Oral, resp. rate (!) 21, height 5\' 8"  (1.727 m), weight 58 kg, SpO2 99 %. Constitutional: well-developed, well-nourished HEENT: pupils equal, round, reactive to light, 34mm b/l, moist conjunctiva, external inspection of ears and nose normal, hearing diminished Oropharynx: normal oropharyngeal mucosa, normal dentition Neck: no thyromegaly, trachea midline, no midline cervical tenderness to palpation Chest: breath sounds equal bilaterally, normal respiratory effort, no midline or lateral chest wall tenderness to palpation/deformity Abdomen: soft, NT, distended, tympanitic, no bruising, no hepatosplenomegaly GU: normal female genitalia  Back:  no wounds, no thoracic/lumbar spine tenderness to palpation, no thoracic/lumbar spine stepoffs Rectal: deferred Extremities: 2+ radial and pedal pulses bilaterally, intact motor and sensation bilateral UE and LE, no peripheral edema MSK: unable to assess gait/station, no clubbing/cyanosis of fingers/toes, normal ROM of all four extremities Skin: warm, dry, no rashes Psych: limited memory, tangential speech    Assessment/Plan: 15F with SBO. NGT placed in ED. After I manipulated it, additional 400cc bilious o/p promptly obtained without significant relief per patient. Gastrografin reportedly administered per husband. Await films. Discussed plan for non-op management and if this fails, may require surgery. Strict NPO, monitor NG o/p. Ambulate.    Diamantina Monks, MD General and Trauma Surgery [facility]

## 2021-02-02 NOTE — ED Notes (Signed)
Pt has had abdominal pain since last night. 3 instances of vomiting since yesterday. Abd distended, tender, 9/10 pain. Hypoactive bowel sounds all quadrants. Last BM this morning, small amount, hard. Pt changed into gown. Family at bedside.

## 2021-02-02 NOTE — ED Provider Notes (Signed)
Emergency Medicine Provider Triage Evaluation Note  Dana Hall , a 80 y.o. female  was evaluated in triage.  Pt complains of diffuse lower abdominal pain since yesterday with vomiting.  Patient was seen today at her primary care office and was instructed to report to the emergency department.  Patient has a history of small bowel obstruction strangulation.  Patient ports she was able to have a bowel movement this morning but was hard in consistency and smaller than her usual once.  She denies any fever.  Review of Systems  Positive: Abdominal pain, vomiting Negative: Fevers, pain, shortness of breath, rectal bleeding  Physical Exam  BP 123/80 (BP Location: Right Arm)   Pulse 78   Temp 98 F (36.7 C) (Oral)   Resp 18   SpO2 100%  Gen:   Awake, no distress -uncomfortable Resp:  Normal effort   MSK:   Moves extremities without difficulty  Other:  Abdomen appears distended without overlying skin changes.  Voluntary guarding present.   Medical Decision Making  Medically screening exam initiated at 11:48 AM.  Appropriate orders placed.  Dana Hall was informed that the remainder of the evaluation will be completed by another provider, this initial triage assessment does not replace that evaluation, and the importance of remaining in the ED until their evaluation is complete.  Abdomen CT and labs ordered.  Nursing staff aware patient is next to room.   Achille Rich, PA-C 02/02/21 1152    Jacalyn Lefevre, MD 02/02/21 [phone]

## 2021-02-02 NOTE — Plan of Care (Signed)
  Problem: Education: Goal: Knowledge of General Education information will improve Description: Including pain rating scale, medication(s)/side effects and non-pharmacologic comfort measures Outcome: Progressing   Problem: Activity: Goal: Risk for activity intolerance will decrease Outcome: Progressing   

## 2021-02-02 NOTE — ED Notes (Signed)
Transported to CT 

## 2021-02-02 NOTE — ED Provider Notes (Signed)
[facility] EMERGENCY DEPARTMENT Provider Note   CSN: [id number] Arrival date & time: 02/02/21  1136     History Chief Complaint  Patient presents with   Abdominal Pain    Dana Hall is a 80 y.o. female.  Patient is an 80 year old female who presents with abdominal pain.  She has a prior history of small bowel obstruction.  She reports worsening pain across her abdomen since last night.  She has associated nausea and vomiting.  No diarrhea.  No fevers.  She had a small bowel movement this morning.  No urinary symptoms.      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  EXPLORATORY LAPAROTOMY;  Surgeon: Ernestene Mention, MD;  Location: [facility];  Service: General;  Laterality: N/A;  exploratory laparotomy for bowel obstruction   MULTIPLE TOOTH EXTRACTIONS     age 65     OB History   No obstetric history on file.     Family History  Problem  Relation Age of Onset   Heart disease Mother    Breast cancer Neg Hx     Social History   Tobacco Use   Smoking status: Never   Smokeless tobacco: Never  Vaping Use   Vaping Use: Never used  Substance Use Topics   Alcohol use: Yes   Drug use: No    Home Medications Prior to Admission medications   Medication Sig Start Date End Date Taking? Authorizing Provider  amLODipine (NORVASC) 5 MG tablet Take 5 mg by mouth daily.    [provider]  lisinopril (ZESTRIL) 20 MG tablet Take 20 mg by mouth daily.    [provider]    Allergies    Patient has no known allergies.  Review of Systems   Review of Systems  Constitutional:  Negative for chills, diaphoresis, fatigue and fever.  HENT:  Negative for congestion, rhinorrhea and sneezing.   Eyes: Negative.   Respiratory:  Negative for cough, chest tightness and shortness of breath.   Cardiovascular:  Negative for chest pain and leg swelling.  Gastrointestinal:  Positive for abdominal distention, abdominal pain, nausea and vomiting. Negative for blood in stool and diarrhea.  Genitourinary:  Negative for difficulty urinating, flank pain, frequency and hematuria.  Musculoskeletal:  Negative for arthralgias and back pain.  Skin:  Negative for rash.  Neurological:  Negative for dizziness, speech difficulty, weakness, numbness and headaches.   Physical  Exam Updated Vital Signs BP (!) 163/78   Pulse 84   Temp 98 F (36.7 C) (Oral)   Resp (!) 21   Ht 5\' 8"  (1.727 m)   Wt 58 kg   SpO2 99%   BMI 19.44 kg/m   Physical Exam Constitutional:      Appearance: She is well-developed.  HENT:     Head: Normocephalic and atraumatic.  Eyes:     Pupils: Pupils are equal, round, and reactive to light.  Cardiovascular:     Rate and Rhythm: Normal rate and regular rhythm.     Heart sounds: Normal heart sounds.  Pulmonary:     Effort: Pulmonary effort is normal. No respiratory distress.     Breath sounds: Normal breath  sounds. No wheezing or rales.  Chest:     Chest wall: No tenderness.  Abdominal:     General: Bowel sounds are decreased. There is distension.     Palpations: Abdomen is soft.     Tenderness: There is generalized abdominal tenderness. There is no guarding or rebound.  Musculoskeletal:        General: Normal range of motion.     Cervical back: Normal range of motion and neck supple.  Lymphadenopathy:     Cervical: No cervical adenopathy.  Skin:    General: Skin is warm and dry.     Findings: No rash.  Neurological:     Mental Status: She is alert and oriented to person, place, and time.    ED Results / Procedures / Treatments   Labs (all labs ordered are listed, but only abnormal results are displayed) Labs Reviewed  CBC WITH DIFFERENTIAL/PLATELET - Abnormal; Notable for the following components:      Result Value   WBC 19.3 (*)    Neutro Abs 17.1 (*)    Monocytes Absolute 1.4 (*)    All other components within normal limits  COMPREHENSIVE METABOLIC PANEL - Abnormal; Notable for the following components:   Sodium 132 (*)    Chloride 93 (*)    Glucose, Bld 163 (*)    Calcium 10.4 (*)    Total Protein 8.4 (*)    Total Bilirubin 1.3 (*)    All other components within normal limits  RESP PANEL BY RT-PCR (FLU A&B, COVID) ARPGX2  LIPASE, BLOOD  URINALYSIS, ROUTINE W REFLEX MICROSCOPIC    EKG None  Radiology CT Abdomen Pelvis W Contrast  Result Date: 02/02/2021 CLINICAL DATA:  Abdominal pain, tenderness EXAM: CT ABDOMEN AND PELVIS WITH CONTRAST TECHNIQUE: Multidetector CT imaging of the abdomen and pelvis was performed using the standard protocol following bolus administration of intravenous contrast. CONTRAST:  02/04/2021 OMNIPAQUE IOHEXOL 300 MG/ML  SOLN COMPARISON:  04/10/2020 FINDINGS: Lower chest: No pleural or pericardial effusion. Fluid in the nondilated distal esophagus. Visualized lung bases normal. Hepatobiliary: No focal liver abnormality is seen. No gallstones,  gallbladder wall thickening, or biliary dilatation. Pancreas: Unremarkable. No pancreatic ductal dilatation or surrounding inflammatory changes. Spleen: Normal in size without focal abnormality. Adrenals/Urinary Tract: Normal adrenal glands. No urolithiasis hydronephrosis. Left parapelvic renal cysts as before. Urinary bladder nondistended. Stomach/Bowel: Gastric body and fundus distended by fluid. Duodenal periampullary diverticulum. Multiple dilated proximal and mid small bowel loops. Transition point in the pelvis. Distal ileal loops are decompressed through the terminal ileum. The colon is nondistended, unremarkable. Vascular/Lymphatic: Mild calcified aortic plaque without aneurysm. No abdominal or pelvic adenopathy. Reproductive: Uterus and bilateral adnexa are unremarkable. Other: Bilateral pelvic phleboliths.  No ascites.  No free  air. Musculoskeletal: Spondylitic changes in the lower lumbar spine. No fracture or worrisome bone lesion. IMPRESSION: 1. High-grade distal small bowel obstruction, transition point in the mid pelvis. No associated mass or lesion is evident, suggesting adhesions. 2.  Aortic Atherosclerosis (ICD10-170.0). Electronically Signed   By: Corlis Leak M.D.   On: 02/02/2021 13:58    Procedures Procedures   Medications Ordered in ED Medications  fentaNYL (SUBLIMAZE) injection 50 mcg (50 mcg Intravenous Given 02/02/21 1308)  iohexol (OMNIPAQUE) 300 MG/ML solution 100 mL (100 mLs Intravenous Contrast Given 02/02/21 1342)    ED Course  I have reviewed the triage vital signs and the nursing notes.  Pertinent labs & imaging results that were available during my care of the patient were reviewed by me and considered in my medical decision making (see chart for details).    MDM Rules/Calculators/A&P                           Patient is a 80 year old female who presents with abdominal pain and vomiting.  CT scan shows findings consistent with a small bowel obstruction.  Her WBC  count is elevated.  NG tube was placed.  I spoke with Dr. Bedelia Person with general surgery who will consult on the patient.  I spoke with Dr. Chipper Herb with the hospitalist service who will admit the patient. Final Clinical Impression(s) / ED Diagnoses Final diagnoses:  SBO (small bowel obstruction) (HCC)    Rx / DC Orders ED Discharge Orders     None        Rolan Bucco, MD 02/02/21 1505

## 2021-02-02 NOTE — ED Notes (Signed)
Attempt x 4 for NG tube, unsuccessful by two RNs.

## 2021-02-03 ENCOUNTER — Inpatient Hospital Stay (HOSPITAL_COMMUNITY): Payer: Medicare PPO

## 2021-02-03 DIAGNOSIS — E871 Hypo-osmolality and hyponatremia: Secondary | ICD-10-CM | POA: Diagnosis not present

## 2021-02-03 DIAGNOSIS — I10 Essential (primary) hypertension: Secondary | ICD-10-CM | POA: Diagnosis not present

## 2021-02-03 DIAGNOSIS — R739 Hyperglycemia, unspecified: Secondary | ICD-10-CM

## 2021-02-03 DIAGNOSIS — D72829 Elevated white blood cell count, unspecified: Secondary | ICD-10-CM

## 2021-02-03 DIAGNOSIS — K56609 Unspecified intestinal obstruction, unspecified as to partial versus complete obstruction: Secondary | ICD-10-CM | POA: Diagnosis not present

## 2021-02-03 LAB — BASIC METABOLIC PANEL
Anion gap: 10 (ref 5–15)
BUN: 25 mg/dL — ABNORMAL HIGH (ref 8–23)
CO2: 27 mmol/L (ref 22–32)
Calcium: 9.2 mg/dL (ref 8.9–10.3)
Chloride: 96 mmol/L — ABNORMAL LOW (ref 98–111)
Creatinine, Ser: 0.79 mg/dL (ref 0.44–1.00)
GFR, Estimated: >60 mL/min (ref >60)
Glucose, Bld: 132 mg/dL — ABNORMAL HIGH (ref 70–99)
Potassium: 4.6 mmol/L (ref 3.5–5.1)
Sodium: 133 mmol/L — ABNORMAL LOW (ref 135–145)

## 2021-02-03 LAB — CBC WITH DIFFERENTIAL/PLATELET
Abs Immature Granulocytes: 0.04 10*3/uL (ref 0.00–0.07)
Basophils Absolute: 0 10*3/uL (ref 0.0–0.1)
Basophils Relative: 0 %
Eosinophils Absolute: 0 10*3/uL (ref 0.0–0.5)
Eosinophils Relative: 0 %
HCT: 43.2 % (ref 36.0–46.0)
Hemoglobin: 14.8 g/dL (ref 12.0–15.0)
Immature Granulocytes: 0 %
Lymphocytes Relative: 7 %
Lymphs Abs: 0.9 10*3/uL (ref 0.7–4.0)
MCH: 31.6 pg (ref 26.0–34.0)
MCHC: 34.3 g/dL (ref 30.0–36.0)
MCV: 92.3 fL (ref 80.0–100.0)
Monocytes Absolute: 1.4 10*3/uL — ABNORMAL HIGH (ref 0.1–1.0)
Monocytes Relative: 11 %
Neutro Abs: 10.6 10*3/uL — ABNORMAL HIGH (ref 1.7–7.7)
Neutrophils Relative %: 82 %
Platelets: 282 10*3/uL (ref 150–400)
RBC: 4.68 MIL/uL (ref 3.87–5.11)
RDW: 12.4 % (ref 11.5–15.5)
WBC: 13 10*3/uL — ABNORMAL HIGH (ref 4.0–10.5)
nRBC: 0 % (ref 0.0–0.2)

## 2021-02-03 LAB — URINALYSIS, ROUTINE W REFLEX MICROSCOPIC
Bilirubin Urine: NEGATIVE
Glucose, UA: NEGATIVE mg/dL
Hgb urine dipstick: NEGATIVE
Ketones, ur: NEGATIVE mg/dL
Leukocytes,Ua: NEGATIVE
Nitrite: NEGATIVE
Protein, ur: 30 mg/dL — AB
Specific Gravity, Urine: 1.044 — ABNORMAL HIGH (ref 1.005–1.030)
pH: 5 (ref 5.0–8.0)

## 2021-02-03 LAB — GLUCOSE, CAPILLARY: Glucose-Capillary: 117 mg/dL — ABNORMAL HIGH (ref 70–99)

## 2021-02-03 MED ORDER — DIATRIZOATE MEGLUMINE & SODIUM 66-10 % PO SOLN
90.0000 mL | Freq: Once | ORAL | Status: AC
  Administered 2021-02-03: 90 mL via NASOGASTRIC
  Filled 2021-02-03: qty 90

## 2021-02-03 MED ORDER — LIP MEDEX EX OINT
TOPICAL_OINTMENT | CUTANEOUS | Status: DC | PRN
  Filled 2021-02-03: qty 7

## 2021-02-03 MED ORDER — PHENOL 1.4 % MT LIQD
1.0000 | OROMUCOSAL | Status: DC | PRN
  Administered 2021-02-03: 1 via OROMUCOSAL
  Filled 2021-02-03: qty 177

## 2021-02-03 NOTE — Progress Notes (Addendum)
PROGRESS NOTE    RHYLEIGH GRASSEL  DVV:[id number] DOB: 26-Feb-1941 DOA: 02/02/2021 PCP: [name], MD  Brief Narrative:  The patient is a 80 year old Caucasian female with a past medical history significant for but not limited to asthma, anemia, BPPV, DDD, GERD, hypertension, hyperlipidemia, history of SBO status post partial small bowel resection as well as other comorbidities who came in with new onset abdominal pain.  She states the symptoms started the day before yesterday and they felt like a cramping lower abdominal pain.  She felt nauseous and started vomiting stomach contents several times this was nonbloody nonbilious.  She states that she had a small bowel movement yesterday morning and that the abdominal pain became constant and more severe.  She went to go see her PCP who recommended that she come to the ED.  She went under further evaluation and had a CT of the abdomen pelvis which showed a high-grade small bowel obstruction to the level of a transection point in the mid pelvis.  Her labs were relatively unremarkable however she did have a hypercalcemia.  General surgery was consulted for further evaluation recommendations and she is undergoing a small bowel protocol and has an NG tube in place  Assessment & Plan:   Active Problems:   SBO (small bowel obstruction) (HCC)  Small bowel obstruction in the setting of adhesions from prior appendectomy -She is admitted to MedSurg -NG tube was placed and connected to low intermittent suction and is now she is undergoing a small bowel protocol today -She was initiated on IV fluid hydration with -She will be given pain medications with Hydromorphone 0.5-1 mg IV q2hprn Severe Pain and Morphine 2 mg IV q4hprn Breakthrough Pain -Continue supportive care and Antiemetics with po/IV q6hprn Nausea -Ensure magnesium level is above 2 and potassium level is above 4; Last K+ was was 4.6 and Mag level still pending -KUB this AM showed "Indwelling  gastric decompression tube remains coiled in the gastric body. Nonspecific bowel gas pattern with persistent scattered loops of gas-filled small bowel."  Hypercalcemia -Likely from severe dehydration and hemoconcentration -IV fluid hydration be given and calcium levels improved -Ca2+ went from 10.4 -> 9.2 -Continue monitor and trend and repeat CMP in the a.m.  Leukocytosis -Likely reactive in the setting of above -WBC is trending down and went from 19.3 -> 13.0 -Lactic acid level was 1.8 and improved to 1.7 -Continue to monitor for signs and symptoms of infection -Repeat CBC in a.m.  Hyperbilirubinemia -Mild and likely reactive. T Bil was 1.3 -Not repeated today but will add on a Liver Panel -Continue to Monitor and Trend -Repeat CMP in the AM  Hyperglycemia -Blood Sugar on Admission was 163 and repeat this AM was 132 -Check HbA1c -Continue to Monitor BS per Protocol  Hyponatremia -Mild and Improving. Na+ went from 132 -> 133 -C/w IVF as above -Continue to Monitor and Trend -Repeat CBC in the AM  Hypertension -Holding all blood pressure medications given that she has NG tube in place (Amlodipine 5 mg po Daily and Lisinopril 20 mg po Daily -Added IV Hydralazine 5 mg IV q6hprn SBP >150 or DBP >100 -We will consider changing amlodipine to avoid possible side effect of constipation from the calcium channel blocker -Continue to monitor blood pressures per protocol -Last blood pressure reading was 132/80  DVT prophylaxis: Enoxaparin 40 mg sq q24h Code Status: FULL CODE Family Communication: Discussed with family present at bedside Disposition Plan: Pending further clinical improvement   Status is: Inpatient  Remains inpatient appropriate because: Still has an obstruction and Abdominal Distention and needs Surgical Clearance  Consultants:  General Surgery  Procedures: Small Bowel Protocol   Antimicrobials:  Anti-infectives (From admission, onward)    None         Subjective: Seen and examined at bedside states that she is feeling a little queasy.  Had more abdominal distention.  No chest pain or shortness of breath.  Denies any lightheadedness or dizziness.  Has not had a bowel movement has not passed any flatus.  No other concerns or complaints at this time.  Objective: Vitals:   02/02/21 1719 02/02/21 2026 02/03/21 0445 02/03/21 0900  BP: (!) 149/70 (!) 142/69 134/69 132/80  Pulse: 87 85 82 86  Resp: 18 18 18 17   Temp: 98.4 F (36.9 C) 98.4 F (36.9 C) 98.4 F (36.9 C) 98.2 F (36.8 C)  TempSrc: Oral Oral Oral Oral  SpO2: 94% 97% 96% 97%  Weight:      Height:        Intake/Output Summary (Last 24 hours) at 02/03/2021 1214 Last data filed at 02/03/2021 02/05/2021 Gross per 24 hour  Intake 1892.93 ml  Output 1800 ml  Net 92.93 ml   Filed Weights   02/02/21 1205  Weight: 58 kg   Examination: Physical Exam:  Constitutional: Thin elderly Caucasian female currently in mild distress appears calm and a little uncomfortable Eyes: Lids and conjunctivae normal, sclerae anicteric  ENMT: External Ears, Nose appear normal.  NG tube is in place grossly normal hearing.  Neck: Appears normal, supple, no cervical masses, normal ROM, no appreciable thyromegaly; no appreciable JVD Respiratory: Diminished to auscultation bilaterally, no wheezing, rales, rhonchi or crackles. Normal respiratory effort and patient is not tachypenic. No accessory muscle use.  Unlabored breathing Cardiovascular: RRR, no murmurs / rubs / gallops. 2+ pedal pulses. No carotid bruits.  Abdomen: Soft, mildly tender, distended secondary body habitus bowel sounds are diminished.   GU: Deferred. Musculoskeletal: No clubbing / cyanosis of digits/nails. No joint deformity upper and lower extremities. Skin: No rashes, lesions, ulcers on limited skin evaluation. No induration; Warm and dry.  Neurologic: CN 2-12 grossly intact with no focal deficits. Romberg sign and cerebellar reflexes  not assessed.  Psychiatric: Normal judgment and insight. Alert and oriented x 3. Normal mood and appropriate affect.   Data Reviewed: I have personally reviewed following labs and imaging studies  CBC: Recent Labs  Lab 02/02/21 1148 02/03/21 0113  WBC 19.3* 13.0*  NEUTROABS 17.1* 10.6*  HGB 15.0 14.8  HCT 45.1 43.2  MCV 93.8 92.3  PLT 341 282   Basic Metabolic Panel: Recent Labs  Lab 02/02/21 1148 02/03/21 0113  NA 132* 133*  K 4.8 4.6  CL 93* 96*  CO2 28 27  GLUCOSE 163* 132*  BUN 22 25*  CREATININE 0.78 0.79  CALCIUM 10.4* 9.2   GFR: Estimated Creatinine Clearance: 49.6 mL/min (by C-G formula based on SCr of 0.79 mg/dL). Liver Function Tests: Recent Labs  Lab 02/02/21 1148  AST 29  ALT 25  ALKPHOS 64  BILITOT 1.3*  PROT 8.4*  ALBUMIN 4.6   Recent Labs  Lab 02/02/21 1148  LIPASE 48   No results for input(s): AMMONIA in the last 168 hours. Coagulation Profile: No results for input(s): INR, PROTIME in the last 168 hours. Cardiac Enzymes: No results for input(s): CKTOTAL, CKMB, CKMBINDEX, TROPONINI in the last 168 hours. BNP (last 3 results) No results for input(s): PROBNP in the last 8760 hours. HbA1C:  No results for input(s): HGBA1C in the last 72 hours. CBG: No results for input(s): GLUCAP in the last 168 hours. Lipid Profile: No results for input(s): CHOL, HDL, LDLCALC, TRIG, CHOLHDL, LDLDIRECT in the last 72 hours. Thyroid Function Tests: No results for input(s): TSH, T4TOTAL, FREET4, T3FREE, THYROIDAB in the last 72 hours. Anemia Panel: No results for input(s): VITAMINB12, FOLATE, FERRITIN, TIBC, IRON, RETICCTPCT in the last 72 hours. Sepsis Labs: Recent Labs  Lab 02/02/21 1520 02/02/21 1809  LATICACIDVEN 1.8 1.7    Recent Results (from the past 240 hour(s))  Resp Panel by RT-PCR (Flu A&B, Covid) Nasopharyngeal Swab     Status: None   Collection Time: 02/02/21  2:50 PM   Specimen: Nasopharyngeal Swab; Nasopharyngeal(NP) swabs in vial  transport medium  Result Value Ref Range Status   SARS Coronavirus 2 by RT PCR NEGATIVE NEGATIVE Final    Comment: (NOTE) SARS-CoV-2 target nucleic acids are NOT DETECTED.  The SARS-CoV-2 RNA is generally detectable in upper respiratory specimens during the acute phase of infection. The lowest concentration of SARS-CoV-2 viral copies this assay can detect is 138 copies/mL. A negative result does not preclude SARS-Cov-2 infection and should not be used as the sole basis for treatment or other patient management decisions. A negative result may occur with  improper specimen collection/handling, submission of specimen other than nasopharyngeal swab, presence of viral mutation(s) within the areas targeted by this assay, and inadequate number of viral copies(<138 copies/mL). A negative result must be combined with clinical observations, patient history, and epidemiological information. The expected result is Negative.  Fact Sheet for Patients:  BloggerCourse.com  Fact Sheet for Healthcare Providers:  SeriousBroker.it  This test is no t yet approved or cleared by the [place] FDA and  has been authorized for detection and/or diagnosis of SARS-CoV-2 by FDA under an Emergency Use Authorization (EUA). This EUA will remain  in effect (meaning this test can be used) for the duration of the COVID-19 declaration under Section 564(b)(1) of the Act, 21 U.S.C.section 360bbb-3(b)(1), unless the authorization is terminated  or revoked sooner.       Influenza A by PCR NEGATIVE NEGATIVE Final   Influenza B by PCR NEGATIVE NEGATIVE Final    Comment: (NOTE) The Xpert Xpress SARS-CoV-2/FLU/RSV plus assay is intended as an aid in the diagnosis of influenza from Nasopharyngeal swab specimens and should not be used as a sole basis for treatment. Nasal washings and aspirates are unacceptable for Xpert Xpress SARS-CoV-2/FLU/RSV testing.  Fact  Sheet for Patients: BloggerCourse.com  Fact Sheet for Healthcare Providers: SeriousBroker.it  This test is not yet approved or cleared by the [place] FDA and has been authorized for detection and/or diagnosis of SARS-CoV-2 by FDA under an Emergency Use Authorization (EUA). This EUA will remain in effect (meaning this test can be used) for the duration of the COVID-19 declaration under Section 564(b)(1) of the Act, 21 U.S.C. section 360bbb-3(b)(1), unless the authorization is terminated or revoked.  Performed at [facility], [address] [place], [place] [address]     RN Pressure Injury Documentation:     Estimated body mass index is 19.44 kg/m as calculated from the following:   Height as of this encounter: 5\' 8"  (1.727 m).   Weight as of this encounter: 58 kg.  Malnutrition Type:   Malnutrition Characteristics:   Nutrition Interventions:    Radiology Studies: CT Abdomen Pelvis W Contrast  Result Date: 02/02/2021 CLINICAL DATA:  Abdominal pain, tenderness EXAM: CT ABDOMEN  AND PELVIS WITH CONTRAST TECHNIQUE: Multidetector CT imaging of the abdomen and pelvis was performed using the standard protocol following bolus administration of intravenous contrast. CONTRAST:  OMNIPAQUE IOHEXOL 300 MG/ML  SOLN COMPARISON:  04/10/2020 FINDINGS: Lower chest: No pleural or pericardial effusion. Fluid in the nondilated distal esophagus. Visualized lung bases normal. Hepatobiliary: No focal liver abnormality is seen. No gallstones, gallbladder wall thickening, or biliary dilatation. Pancreas: Unremarkable. No pancreatic ductal dilatation or surrounding inflammatory changes. Spleen: Normal in size without focal abnormality. Adrenals/Urinary Tract: Normal adrenal glands. No urolithiasis hydronephrosis. Left parapelvic renal cysts as before. Urinary bladder nondistended. Stomach/Bowel: Gastric body and fundus distended by fluid.  Duodenal periampullary diverticulum. Multiple dilated proximal and mid small bowel loops. Transition point in the pelvis. Distal ileal loops are decompressed through the terminal ileum. The colon is nondistended, unremarkable. Vascular/Lymphatic: Mild calcified aortic plaque without aneurysm. No abdominal or pelvic adenopathy. Reproductive: Uterus and bilateral adnexa are unremarkable. Other: Bilateral pelvic phleboliths.  No ascites.  No free air. Musculoskeletal: Spondylitic changes in the lower lumbar spine. No fracture or worrisome bone lesion. IMPRESSION: 1. High-grade distal small bowel obstruction, transition point in the mid pelvis. No associated mass or lesion is evident, suggesting adhesions. 2.  Aortic Atherosclerosis (ICD10-170.0). Electronically Signed   By: Corlis Leak M.D.   On: 02/02/2021 13:58   DG Abd Portable 1V-Small Bowel Protocol-Position Verification  Result Date: 02/03/2021 CLINICAL DATA:  80 year old female with nasogastric tube in place. EXAM: PORTABLE ABDOMEN - 1 VIEW COMPARISON:  Earlier the same day FINDINGS: Gastric decompression tube has a redundant course, coiled in the gastric body with the tip near the gastroesophageal junction, similar to comparison. Scattered gaseous containing loops of small bowel are again visualized without significant distension. The colon is relatively decompressed. IMPRESSION: Indwelling gastric decompression tube remains coiled in the gastric body. Nonspecific bowel gas pattern with persistent scattered loops of gas-filled small bowel. Electronically Signed   By: Marliss Coots M.D.   On: 02/03/2021 11:05   DG Abd Portable 1V-Small Bowel Obstruction Protocol-initial, 8 hr delay  Result Date: 02/03/2021 CLINICAL DATA:  Small-bowel obstruction.  8 hour delayed film. EXAM: PORTABLE ABDOMEN - 1 VIEW COMPARISON:  Abdominal radiograph dated 02/02/2021. FINDINGS: Similar positioning of the enteric tube. Persistent dilatation of small-bowel loops measuring  approximately 3.3 cm in the left lower quadrant. Excreted contrast noted in the urinary bladder. Degenerative changes of the spine. No acute osseous pathology. IMPRESSION: Persistent dilatation of small-bowel loops in the left lower quadrant. Electronically Signed   By: Elgie Collard M.D.   On: 02/03/2021 00:53   DG Abd Portable 1V-Small Bowel Protocol-Position Verification  Result Date: 02/02/2021 CLINICAL DATA:  Abdominal pain EXAM: PORTABLE ABDOMEN - 1 VIEW COMPARISON:  X-ray abdomen 11/20/2011, CT abdomen pelvis 02/02/2021 FINDINGS: Enteric tube noted coursing below the hemidiaphragm with tip and side port overlying the expected region of the gastric lumen. Excreted intravenous contrast noted within bilateral collecting systems. Some distended small bowel loops are noted in the left mid abdomen but not well visualized. IMPRESSION: Enteric tube in good position.  Could consider retraction by 5 cm. Electronically Signed   By: Tish Frederickson M.D.   On: 02/02/2021 16:12    Scheduled Meds:  enoxaparin (LOVENOX) injection  40 mg Subcutaneous Q24H   Continuous Infusions:  sodium chloride 100 mL/hr at 02/03/21 0959    LOS: 1 day   Merlene Laughter, DO [facility] PAGER is on AMION  If 7PM-7AM, please contact night-coverage [email or web address]

## 2021-02-03 NOTE — Progress Notes (Signed)
Subjective/Chief Complaint: Feels better today. Says she may have passed a very small amount of flatus   Objective: Vital signs in last 24 hours: Temp:  [98 F (36.7 C)-98.4 F (36.9 C)] 98.4 F (36.9 C) (10/23 0445) Pulse Rate:  [73-94] 82 (10/23 0445) Resp:  [14-24] 18 (10/23 0445) BP: (123-163)/(69-84) 134/69 (10/23 0445) SpO2:  [94 %-100 %] 96 % (10/23 0445) Weight:  [58 kg] 58 kg (10/22 1205)    Intake/Output from previous day: 10/22 0701 - 10/23 0700 In: 1433 [I.V.:1353; NG/GT:80] Out: 1800 [Emesis/NG output:1800] Intake/Output this shift: No intake/output data recorded.  General appearance: alert and cooperative Resp: clear to auscultation bilaterally Cardio: regular rate and rhythm GI: soft, moderate distension. Mild to moderate tenderness. Good bs  Lab Results:  Recent Labs    02/02/21 1148 02/03/21 0113  WBC 19.3* 13.0*  HGB 15.0 14.8  HCT 45.1 43.2  PLT 341 282   BMET Recent Labs    02/02/21 1148 02/03/21 0113  NA 132* 133*  K 4.8 4.6  CL 93* 96*  CO2 28 27  GLUCOSE 163* 132*  BUN 22 25*  CREATININE 0.78 0.79  CALCIUM 10.4* 9.2   PT/INR No results for input(s): LABPROT, INR in the last 72 hours. ABG No results for input(s): PHART, HCO3 in the last 72 hours.  Invalid input(s): PCO2, PO2  Studies/Results: CT Abdomen Pelvis W Contrast  Result Date: 02/02/2021 CLINICAL DATA:  Abdominal pain, tenderness EXAM: CT ABDOMEN AND PELVIS WITH CONTRAST TECHNIQUE: Multidetector CT imaging of the abdomen and pelvis was performed using the standard protocol following bolus administration of intravenous contrast. CONTRAST:  OMNIPAQUE IOHEXOL 300 MG/ML  SOLN COMPARISON:  04/10/2020 FINDINGS: Lower chest: No pleural or pericardial effusion. Fluid in the nondilated distal esophagus. Visualized lung bases normal. Hepatobiliary: No focal liver abnormality is seen. No gallstones, gallbladder wall thickening, or biliary dilatation. Pancreas:  Unremarkable. No pancreatic ductal dilatation or surrounding inflammatory changes. Spleen: Normal in size without focal abnormality. Adrenals/Urinary Tract: Normal adrenal glands. No urolithiasis hydronephrosis. Left parapelvic renal cysts as before. Urinary bladder nondistended. Stomach/Bowel: Gastric body and fundus distended by fluid. Duodenal periampullary diverticulum. Multiple dilated proximal and mid small bowel loops. Transition point in the pelvis. Distal ileal loops are decompressed through the terminal ileum. The colon is nondistended, unremarkable. Vascular/Lymphatic: Mild calcified aortic plaque without aneurysm. No abdominal or pelvic adenopathy. Reproductive: Uterus and bilateral adnexa are unremarkable. Other: Bilateral pelvic phleboliths.  No ascites.  No free air. Musculoskeletal: Spondylitic changes in the lower lumbar spine. No fracture or worrisome bone lesion. IMPRESSION: 1. High-grade distal small bowel obstruction, transition point in the mid pelvis. No associated mass or lesion is evident, suggesting adhesions. 2.  Aortic Atherosclerosis (ICD10-170.0). Electronically Signed   By: Corlis Leak M.D.   On: 02/02/2021 13:58   DG Abd Portable 1V-Small Bowel Obstruction Protocol-initial, 8 hr delay  Result Date: 02/03/2021 CLINICAL DATA:  Small-bowel obstruction.  8 hour delayed film. EXAM: PORTABLE ABDOMEN - 1 VIEW COMPARISON:  Abdominal radiograph dated 02/02/2021. FINDINGS: Similar positioning of the enteric tube. Persistent dilatation of small-bowel loops measuring approximately 3.3 cm in the left lower quadrant. Excreted contrast noted in the urinary bladder. Degenerative changes of the spine. No acute osseous pathology. IMPRESSION: Persistent dilatation of small-bowel loops in the left lower quadrant. Electronically Signed   By: Elgie Collard M.D.   On: 02/03/2021 00:53   DG Abd Portable 1V-Small Bowel Protocol-Position Verification  Result Date: 02/02/2021 CLINICAL DATA:   Abdominal pain  EXAM: PORTABLE ABDOMEN - 1 VIEW COMPARISON:  X-ray abdomen 11/20/2011, CT abdomen pelvis 02/02/2021 FINDINGS: Enteric tube noted coursing below the hemidiaphragm with tip and side port overlying the expected region of the gastric lumen. Excreted intravenous contrast noted within bilateral collecting systems. Some distended small bowel loops are noted in the left mid abdomen but not well visualized. IMPRESSION: Enteric tube in good position.  Could consider retraction by 5 cm. Electronically Signed   By: Tish Frederickson M.D.   On: 02/02/2021 16:12    Anti-infectives: Anti-infectives (From admission, onward)    None       Assessment/Plan: s/p * No surgery found * SBO. Restart the small bowel protocol today since ng was likely hooked back up too early ambulate  LOS: 1 day    Chevis Pretty III 02/03/2021

## 2021-02-04 ENCOUNTER — Inpatient Hospital Stay (HOSPITAL_COMMUNITY): Payer: Medicare PPO | Admitting: Certified Registered"

## 2021-02-04 ENCOUNTER — Encounter (HOSPITAL_COMMUNITY): Admission: EM | Disposition: A | Discharge status: Home / Self Care | Payer: Self-pay | Source: Home / Self Care

## 2021-02-04 ENCOUNTER — Encounter (HOSPITAL_COMMUNITY): Payer: Self-pay | Admitting: Internal Medicine

## 2021-02-04 ENCOUNTER — Inpatient Hospital Stay (HOSPITAL_COMMUNITY): Payer: Medicare PPO

## 2021-02-04 DIAGNOSIS — R739 Hyperglycemia, unspecified: Secondary | ICD-10-CM | POA: Diagnosis not present

## 2021-02-04 DIAGNOSIS — D72829 Elevated white blood cell count, unspecified: Secondary | ICD-10-CM | POA: Diagnosis not present

## 2021-02-04 DIAGNOSIS — K56609 Unspecified intestinal obstruction, unspecified as to partial versus complete obstruction: Secondary | ICD-10-CM | POA: Diagnosis not present

## 2021-02-04 HISTORY — PX: LYSIS OF ADHESION: SHX5961

## 2021-02-04 HISTORY — PX: LAPAROTOMY: SHX154

## 2021-02-04 LAB — COMPREHENSIVE METABOLIC PANEL
ALT: 16 U/L (ref 0–44)
AST: 23 U/L (ref 15–41)
Albumin: 3.4 g/dL — ABNORMAL LOW (ref 3.5–5.0)
Alkaline Phosphatase: 47 U/L (ref 38–126)
Anion gap: 10 (ref 5–15)
BUN: 24 mg/dL — ABNORMAL HIGH (ref 8–23)
CO2: 29 mmol/L (ref 22–32)
Calcium: 9 mg/dL (ref 8.9–10.3)
Chloride: 99 mmol/L (ref 98–111)
Creatinine, Ser: 0.67 mg/dL (ref 0.44–1.00)
GFR, Estimated: >60 mL/min (ref >60)
Glucose, Bld: 102 mg/dL — ABNORMAL HIGH (ref 70–99)
Potassium: 4.4 mmol/L (ref 3.5–5.1)
Sodium: 138 mmol/L (ref 135–145)
Total Bilirubin: 1 mg/dL (ref 0.3–1.2)
Total Protein: 6.6 g/dL (ref 6.5–8.1)

## 2021-02-04 LAB — MAGNESIUM: Magnesium: 2.3 mg/dL (ref 1.7–2.4)

## 2021-02-04 LAB — CBC WITH DIFFERENTIAL/PLATELET
Abs Immature Granulocytes: 0.04 10*3/uL (ref 0.00–0.07)
Basophils Absolute: 0 10*3/uL (ref 0.0–0.1)
Basophils Relative: 0 %
Eosinophils Absolute: 0 10*3/uL (ref 0.0–0.5)
Eosinophils Relative: 0 %
HCT: 44.2 % (ref 36.0–46.0)
Hemoglobin: 14.5 g/dL (ref 12.0–15.0)
Immature Granulocytes: 0 %
Lymphocytes Relative: 10 %
Lymphs Abs: 1.1 10*3/uL (ref 0.7–4.0)
MCH: 31.7 pg (ref 26.0–34.0)
MCHC: 32.8 g/dL (ref 30.0–36.0)
MCV: 96.7 fL (ref 80.0–100.0)
Monocytes Absolute: 1.2 10*3/uL — ABNORMAL HIGH (ref 0.1–1.0)
Monocytes Relative: 10 %
Neutro Abs: 8.9 10*3/uL — ABNORMAL HIGH (ref 1.7–7.7)
Neutrophils Relative %: 80 %
Platelets: 286 10*3/uL (ref 150–400)
RBC: 4.57 MIL/uL (ref 3.87–5.11)
RDW: 12.4 % (ref 11.5–15.5)
WBC: 11.3 10*3/uL — ABNORMAL HIGH (ref 4.0–10.5)
nRBC: 0 % (ref 0.0–0.2)

## 2021-02-04 LAB — TYPE AND SCREEN
ABO/RH(D): A POS
Antibody Screen: NEGATIVE

## 2021-02-04 LAB — ABO/RH: ABO/RH(D): A POS

## 2021-02-04 LAB — PHOSPHORUS: Phosphorus: 3.4 mg/dL (ref 2.5–4.6)

## 2021-02-04 SURGERY — LAPAROTOMY, EXPLORATORY
Anesthesia: General | Site: Abdomen

## 2021-02-04 MED ORDER — CHLORHEXIDINE GLUCONATE 0.12 % MT SOLN: 15.0000 mL | Freq: Once | OROMUCOSAL | Status: DC

## 2021-02-04 MED ORDER — PHENYLEPHRINE 40 MCG/ML (10ML) SYRINGE FOR IV PUSH (FOR BLOOD PRESSURE SUPPORT)
PREFILLED_SYRINGE | INTRAVENOUS | Status: DC | PRN
  Administered 2021-02-04 (×2): 80 ug via INTRAVENOUS

## 2021-02-04 MED ORDER — ACETAMINOPHEN 10 MG/ML IV SOLN
1000.0000 mg | Freq: Four times a day (QID) | INTRAVENOUS | Status: AC
  Administered 2021-02-04 – 2021-02-05 (×4): 1000 mg via INTRAVENOUS
  Filled 2021-02-04 (×4): qty 100

## 2021-02-04 MED ORDER — ORAL CARE MOUTH RINSE
15.0000 mL | Freq: Two times a day (BID) | OROMUCOSAL | Status: DC
  Administered 2021-02-04 – 2021-02-13 (×18): 15 mL via OROMUCOSAL

## 2021-02-04 MED ORDER — ROCURONIUM BROMIDE 10 MG/ML (PF) SYRINGE
PREFILLED_SYRINGE | INTRAVENOUS | Status: AC
  Filled 2021-02-04: qty 20

## 2021-02-04 MED ORDER — FENTANYL CITRATE (PF) 100 MCG/2ML IJ SOLN
INTRAMUSCULAR | Status: AC
  Filled 2021-02-04: qty 2

## 2021-02-04 MED ORDER — FENTANYL CITRATE (PF) 100 MCG/2ML IJ SOLN
25.0000 ug | INTRAMUSCULAR | Status: DC | PRN
  Administered 2021-02-04 (×3): 50 ug via INTRAVENOUS

## 2021-02-04 MED ORDER — LIDOCAINE 2% (20 MG/ML) 5 ML SYRINGE
INTRAMUSCULAR | Status: AC
  Filled 2021-02-04: qty 10

## 2021-02-04 MED ORDER — SUCCINYLCHOLINE CHLORIDE 200 MG/10ML IV SOSY
PREFILLED_SYRINGE | INTRAVENOUS | Status: AC
  Filled 2021-02-04: qty 10

## 2021-02-04 MED ORDER — SUGAMMADEX SODIUM 200 MG/2ML IV SOLN
INTRAVENOUS | Status: DC | PRN
  Administered 2021-02-04: 200 mg via INTRAVENOUS

## 2021-02-04 MED ORDER — FENTANYL CITRATE (PF) 250 MCG/5ML IJ SOLN
INTRAMUSCULAR | Status: DC | PRN
  Administered 2021-02-04: 75 ug via INTRAVENOUS
  Administered 2021-02-04 (×2): 50 ug via INTRAVENOUS
  Administered 2021-02-04: 75 ug via INTRAVENOUS

## 2021-02-04 MED ORDER — LACTATED RINGERS IV SOLN: INTRAVENOUS | Status: DC

## 2021-02-04 MED ORDER — PROPOFOL 1000 MG/100ML IV EMUL
INTRAVENOUS | Status: AC
  Filled 2021-02-04: qty 100

## 2021-02-04 MED ORDER — CEFAZOLIN SODIUM 1 G IJ SOLR
INTRAMUSCULAR | Status: AC
  Filled 2021-02-04: qty 20

## 2021-02-04 MED ORDER — ORAL CARE MOUTH RINSE: 15.0000 mL | Freq: Once | OROMUCOSAL | Status: DC

## 2021-02-04 MED ORDER — 0.9 % SODIUM CHLORIDE (POUR BTL) OPTIME
TOPICAL | Status: DC | PRN
  Administered 2021-02-04: 1000 mL

## 2021-02-04 MED ORDER — ROCURONIUM BROMIDE 10 MG/ML (PF) SYRINGE
PREFILLED_SYRINGE | INTRAVENOUS | Status: DC | PRN
  Administered 2021-02-04: 40 mg via INTRAVENOUS
  Administered 2021-02-04: 10 mg via INTRAVENOUS

## 2021-02-04 MED ORDER — PROPOFOL 10 MG/ML IV BOLUS
INTRAVENOUS | Status: DC | PRN
  Administered 2021-02-04: 120 mg via INTRAVENOUS

## 2021-02-04 MED ORDER — ONDANSETRON HCL 4 MG/2ML IJ SOLN
INTRAMUSCULAR | Status: AC
  Filled 2021-02-04: qty 4

## 2021-02-04 MED ORDER — PHENYLEPHRINE 40 MCG/ML (10ML) SYRINGE FOR IV PUSH (FOR BLOOD PRESSURE SUPPORT)
PREFILLED_SYRINGE | INTRAVENOUS | Status: AC
  Filled 2021-02-04: qty 10

## 2021-02-04 MED ORDER — FENTANYL CITRATE (PF) 250 MCG/5ML IJ SOLN
INTRAMUSCULAR | Status: AC
  Filled 2021-02-04: qty 5

## 2021-02-04 MED ORDER — METHOCARBAMOL 1000 MG/10ML IJ SOLN
500.0000 mg | Freq: Four times a day (QID) | INTRAVENOUS | Status: DC | PRN
  Filled 2021-02-04: qty 5

## 2021-02-04 MED ORDER — SODIUM CHLORIDE 0.9 % IV SOLN: INTRAVENOUS | Status: AC

## 2021-02-04 MED ORDER — ACETAMINOPHEN 10 MG/ML IV SOLN
INTRAVENOUS | Status: AC
  Filled 2021-02-04: qty 100

## 2021-02-04 MED ORDER — PROPOFOL 10 MG/ML IV BOLUS
INTRAVENOUS | Status: AC
  Filled 2021-02-04: qty 20

## 2021-02-04 MED ORDER — ONDANSETRON HCL 4 MG/2ML IJ SOLN: 4.0000 mg | Freq: Once | INTRAMUSCULAR | Status: DC | PRN

## 2021-02-04 MED ORDER — LIDOCAINE 2% (20 MG/ML) 5 ML SYRINGE
INTRAMUSCULAR | Status: DC | PRN
  Administered 2021-02-04: 60 mg via INTRAVENOUS

## 2021-02-04 MED ORDER — SUCCINYLCHOLINE CHLORIDE 200 MG/10ML IV SOSY
PREFILLED_SYRINGE | INTRAVENOUS | Status: DC | PRN
  Administered 2021-02-04: 100 mg via INTRAVENOUS

## 2021-02-04 MED ORDER — DEXAMETHASONE SODIUM PHOSPHATE 10 MG/ML IJ SOLN
INTRAMUSCULAR | Status: DC | PRN
  Administered 2021-02-04: 5 mg via INTRAVENOUS

## 2021-02-04 MED ORDER — ONDANSETRON HCL 4 MG/2ML IJ SOLN
INTRAMUSCULAR | Status: DC | PRN
  Administered 2021-02-04: 4 mg via INTRAVENOUS

## 2021-02-04 MED ORDER — ACETAMINOPHEN 10 MG/ML IV SOLN
INTRAVENOUS | Status: DC | PRN
  Administered 2021-02-04: 1000 mg via INTRAVENOUS

## 2021-02-04 MED ORDER — DEXAMETHASONE SODIUM PHOSPHATE 10 MG/ML IJ SOLN
INTRAMUSCULAR | Status: AC
  Filled 2021-02-04: qty 2

## 2021-02-04 MED ORDER — CEFAZOLIN SODIUM-DEXTROSE 2-3 GM-%(50ML) IV SOLR
INTRAVENOUS | Status: DC | PRN
  Administered 2021-02-04: 2 g via INTRAVENOUS

## 2021-02-04 SURGICAL SUPPLY — 40 items
APL PRP STRL LF DISP 70% ISPRP (MISCELLANEOUS) ×1
BAG COUNTER SPONGE SURGICOUNT (BAG) ×2 IMPLANT
BAG SPNG CNTER NS LX DISP (BAG) ×1
BAG SURGICOUNT SPONGE COUNTING (BAG) ×1
BLADE CLIPPER SURG (BLADE) IMPLANT
BRR ADH 5X3 SEPRAFILM 6 SHT (MISCELLANEOUS) ×1
CANISTER SUCT 3000ML PPV (MISCELLANEOUS) ×3 IMPLANT
CHLORAPREP W/TINT 26 (MISCELLANEOUS) ×3 IMPLANT
COVER SURGICAL LIGHT HANDLE (MISCELLANEOUS) ×3 IMPLANT
DRAPE LAPAROSCOPIC ABDOMINAL (DRAPES) ×3 IMPLANT
DRAPE WARM FLUID 44X44 (DRAPES) ×3 IMPLANT
DRSG OPSITE POSTOP 4X10 (GAUZE/BANDAGES/DRESSINGS) ×2 IMPLANT
DRSG OPSITE POSTOP 4X8 (GAUZE/BANDAGES/DRESSINGS) IMPLANT
ELECT BLADE 6.5 EXT (BLADE) ×2 IMPLANT
ELECT CAUTERY BLADE 6.4 (BLADE) ×3 IMPLANT
ELECT REM PT RETURN 9FT ADLT (ELECTROSURGICAL) ×3
ELECTRODE REM PT RTRN 9FT ADLT (ELECTROSURGICAL) ×1 IMPLANT
GLOVE SURG ENC MOIS LTX SZ6 (GLOVE) ×3 IMPLANT
GLOVE SURG UNDER LTX SZ6.5 (GLOVE) ×3 IMPLANT
GOWN STRL REUS W/ TWL LRG LVL3 (GOWN DISPOSABLE) ×2 IMPLANT
GOWN STRL REUS W/TWL LRG LVL3 (GOWN DISPOSABLE) ×6
HANDLE SUCTION POOLE (INSTRUMENTS) ×1 IMPLANT
KIT BASIN OR (CUSTOM PROCEDURE TRAY) ×3 IMPLANT
KIT TURNOVER KIT B (KITS) ×3 IMPLANT
LIGASURE IMPACT 36 18CM CVD LR (INSTRUMENTS) IMPLANT
NS IRRIG 1000ML POUR BTL (IV SOLUTION) ×6 IMPLANT
PACK GENERAL/GYN (CUSTOM PROCEDURE TRAY) ×3 IMPLANT
PAD ARMBOARD 7.5X6 YLW CONV (MISCELLANEOUS) ×3 IMPLANT
SEPRAFILM PROCEDURAL PACK 3X5 (MISCELLANEOUS) ×2 IMPLANT
SPONGE T-LAP 18X18 ~~LOC~~+RFID (SPONGE) ×6 IMPLANT
STAPLER VISISTAT 35W (STAPLE) ×3 IMPLANT
SUCTION POOLE HANDLE (INSTRUMENTS) ×3
SUT PDS AB 1 TP1 96 (SUTURE) ×6 IMPLANT
SUT SILK 2 0 SH CR/8 (SUTURE) ×3 IMPLANT
SUT SILK 2 0 TIES 10X30 (SUTURE) ×3 IMPLANT
SUT SILK 3 0 SH CR/8 (SUTURE) ×3 IMPLANT
SUT SILK 3 0 TIES 10X30 (SUTURE) ×3 IMPLANT
SUT VIC AB 3-0 SH 18 (SUTURE) IMPLANT
TOWEL GREEN STERILE (TOWEL DISPOSABLE) ×3 IMPLANT
TRAY FOLEY MTR SLVR 16FR STAT (SET/KITS/TRAYS/PACK) ×2 IMPLANT

## 2021-02-04 NOTE — Transfer of Care (Signed)
Immediate Anesthesia Transfer of Care Note  Patient: Dana Hall  Procedure(s) Performed: EXPLORATORY LAPAROTOMY (Abdomen) LYSIS OF ADHESION WITH ADHESION BARRIER PLACED (Abdomen)  Patient Location: PACU  Anesthesia Type:General  Level of Consciousness: awake, alert  and oriented  Airway & Oxygen Therapy: Patient Spontanous Breathing and Patient connected to face mask oxygen  Post-op Assessment: Report given to RN and Post -op Vital signs reviewed and stable  Post vital signs: Reviewed and stable  Last Vitals:  Vitals Value Taken Time  BP 142/78   Temp    Pulse 86 02/04/21 1457  Resp 12 02/04/21 1457  SpO2 100 % 02/04/21 1457  Vitals shown include unvalidated device data.  Last Pain:  Vitals:   02/04/21 1150  TempSrc: Oral  PainSc: 1       Patients Stated Pain Goal: 2 (02/04/21 0155)  Complications: No notable events documented.

## 2021-02-04 NOTE — Progress Notes (Signed)
[facility] Progress Note     Subjective: CC: throat discomfort Reports not feeling well - states she felt ok yesterday AM but got progressively worse throughout the day, also states her NG tube was not working until shift change last night. Denies flatus or BM. Her husband and mother are at the bedside. Patient has been ambulating overnight and is ambulating to the bathroom.  Patient states she is ready for surgery - doesn't think she is getting better. Confirms her history of open appendectomy and ex lap w/ SBR for previous SBO.  Objective: Vital signs in last 24 hours: Temp:  [97.9 F (36.6 C)-98.6 F (37 C)] 98.3 F (36.8 C) (10/24 0801) Pulse Rate:  [81-87] 87 (10/24 0801) Resp:  [16-19] 18 (10/24 0801) BP: (138-158)/(75-82) 158/77 (10/24 0801) SpO2:  [94 %-96 %] 94 % (10/24 0801) Last BM Date:  (Unknown)  Intake/Output from previous day: 10/23 0701 - 10/24 0700 In: 714.9 [I.V.:684.9; NG/GT:30] Out: 1350 [Urine:800; Emesis/NG output:550] Intake/Output this shift: No intake/output data recorded.  PE: Gen:  Alert, NAD, appears uncomfortable, pleasant and cooperative, appears younger than stated age. Card:  Regular rate and rhythm, pedal pulses 2+ BL Pulm:  Normal effort, clear to auscultation bilaterally Abd: Soft, moderately distended and tympanic, mild TTP lower abdomen without guarding, +BS  NG- 600 cc/24h+ (really just this much overnight), on my exam NGT is not functioning properly. Lopez valve switched out, flushed, began to work. Skin: warm and dry, no rashes  Psych: A&Ox3   Lab Results:  Recent Labs    02/03/21 0113 02/04/21 0027  WBC 13.0* 11.3*  HGB 14.8 14.5  HCT 43.2 44.2  PLT 282 286   BMET Recent Labs    02/03/21 0113 02/04/21 0027  NA 133* 138  K 4.6 4.4  CL 96* 99  CO2 27 29  GLUCOSE 132* 102*  BUN 25* 24*  CREATININE 0.79 0.67  CALCIUM 9.2 9.0   PT/INR No results for input(s): LABPROT, INR in the last 72 hours. CMP      Component Value Date/Time   NA 138 02/04/2021 0027   K 4.4 02/04/2021 0027   CL 99 02/04/2021 0027   CO2 29 02/04/2021 0027   GLUCOSE 102 (H) 02/04/2021 0027   BUN 24 (H) 02/04/2021 0027   CREATININE 0.67 02/04/2021 0027   CREATININE 0.50 12/22/2011 1045   CALCIUM 9.0 02/04/2021 0027   PROT 6.6 02/04/2021 0027   ALBUMIN 3.4 (L) 02/04/2021 0027   AST 23 02/04/2021 0027   ALT 16 02/04/2021 0027   ALKPHOS 47 02/04/2021 0027   BILITOT 1.0 02/04/2021 0027   GFRNONAA >60 02/04/2021 0027   GFRAA >90 11/24/2011 0409   Lipase     Component Value Date/Time   LIPASE 48 02/02/2021 1148       Studies/Results: DG Abd 1 View  Result Date: 02/04/2021 CLINICAL DATA:  Nausea, vomiting. EXAM: ABDOMEN - 1 VIEW COMPARISON:  February 03, 2021. FINDINGS: Nasogastric tube tip is seen in proximal stomach. Mildly dilated small bowel loops are noted concerning for ileus or possibly distal small bowel obstruction. No colonic dilatation is noted. IMPRESSION: Mildly dilated small bowel loops are noted concerning for ileus or possibly distal small bowel obstruction. Electronically Signed   By: Lupita Raider M.D.   On: 02/04/2021 08:28   CT Abdomen Pelvis W Contrast  Result Date: 02/02/2021 CLINICAL DATA:  Abdominal pain, tenderness EXAM: CT ABDOMEN AND PELVIS WITH CONTRAST TECHNIQUE: Multidetector CT imaging of the abdomen and  pelvis was performed using the standard protocol following bolus administration of intravenous contrast. CONTRAST:  OMNIPAQUE IOHEXOL 300 MG/ML  SOLN COMPARISON:  04/10/2020 FINDINGS: Lower chest: No pleural or pericardial effusion. Fluid in the nondilated distal esophagus. Visualized lung bases normal. Hepatobiliary: No focal liver abnormality is seen. No gallstones, gallbladder wall thickening, or biliary dilatation. Pancreas: Unremarkable. No pancreatic ductal dilatation or surrounding inflammatory changes. Spleen: Normal in size without focal abnormality. Adrenals/Urinary  Tract: Normal adrenal glands. No urolithiasis hydronephrosis. Left parapelvic renal cysts as before. Urinary bladder nondistended. Stomach/Bowel: Gastric body and fundus distended by fluid. Duodenal periampullary diverticulum. Multiple dilated proximal and mid small bowel loops. Transition point in the pelvis. Distal ileal loops are decompressed through the terminal ileum. The colon is nondistended, unremarkable. Vascular/Lymphatic: Mild calcified aortic plaque without aneurysm. No abdominal or pelvic adenopathy. Reproductive: Uterus and bilateral adnexa are unremarkable. Other: Bilateral pelvic phleboliths.  No ascites.  No free air. Musculoskeletal: Spondylitic changes in the lower lumbar spine. No fracture or worrisome bone lesion. IMPRESSION: 1. High-grade distal small bowel obstruction, transition point in the mid pelvis. No associated mass or lesion is evident, suggesting adhesions. 2.  Aortic Atherosclerosis (ICD10-170.0). Electronically Signed   By: Corlis Leak M.D.   On: 02/02/2021 13:58   DG Abd Portable 1V-Small Bowel Obstruction Protocol-initial, 8 hr delay  Result Date: 02/03/2021 CLINICAL DATA:  Small-bowel obstruction EXAM: PORTABLE ABDOMEN - 1 VIEW COMPARISON:  Earlier same day.  02/02/2021. FINDINGS: Nasogastric tube remains in the stomach. Contrast fills the fundus of the stomach. Previously administered contrast shows moderate progression throughout dilated small bowel. Contrast has not yet reached the colon. IMPRESSION: Administered contrast shows moderate progression through dilated small bowel but has not yet reached the colon. Electronically Signed   By: Paulina Fusi M.D.   On: 02/03/2021 17:16   DG Abd Portable 1V-Small Bowel Protocol-Position Verification  Result Date: 02/03/2021 CLINICAL DATA:  80 year old female with nasogastric tube in place. EXAM: PORTABLE ABDOMEN - 1 VIEW COMPARISON:  Earlier the same day FINDINGS: Gastric decompression tube has a redundant course, coiled in  the gastric body with the tip near the gastroesophageal junction, similar to comparison. Scattered gaseous containing loops of small bowel are again visualized without significant distension. The colon is relatively decompressed. IMPRESSION: Indwelling gastric decompression tube remains coiled in the gastric body. Nonspecific bowel gas pattern with persistent scattered loops of gas-filled small bowel. Electronically Signed   By: Marliss Coots M.D.   On: 02/03/2021 11:05   DG Abd Portable 1V-Small Bowel Obstruction Protocol-initial, 8 hr delay  Result Date: 02/03/2021 CLINICAL DATA:  Small-bowel obstruction.  8 hour delayed film. EXAM: PORTABLE ABDOMEN - 1 VIEW COMPARISON:  Abdominal radiograph dated 02/02/2021. FINDINGS: Similar positioning of the enteric tube. Persistent dilatation of small-bowel loops measuring approximately 3.3 cm in the left lower quadrant. Excreted contrast noted in the urinary bladder. Degenerative changes of the spine. No acute osseous pathology. IMPRESSION: Persistent dilatation of small-bowel loops in the left lower quadrant. Electronically Signed   By: Elgie Collard M.D.   On: 02/03/2021 00:53   DG Abd Portable 1V-Small Bowel Protocol-Position Verification  Result Date: 02/02/2021 CLINICAL DATA:  Abdominal pain EXAM: PORTABLE ABDOMEN - 1 VIEW COMPARISON:  X-ray abdomen 11/20/2011, CT abdomen pelvis 02/02/2021 FINDINGS: Enteric tube noted coursing below the hemidiaphragm with tip and side port overlying the expected region of the gastric lumen. Excreted intravenous contrast noted within bilateral collecting systems. Some distended small bowel loops are noted in the left mid abdomen  but not well visualized. IMPRESSION: Enteric tube in good position.  Could consider retraction by 5 cm. Electronically Signed   By: Tish Frederickson M.D.   On: 02/02/2021 16:12    Anti-infectives: Anti-infectives (From admission, onward)    None        Assessment/Plan  SBO, recurrent -  SHx - open appendectomy, ex lap/SBR for prior SBO 2013 Dr. Derrell Lolling  - SBO protocol performed. KUB with dilated loops, no definite contrast in the colon. - clinically not improving, no flatus or stool.  - will review with Dr. Fredricka Bonine, patient may warrant exploration for ongoing SBO.  - PRN analgesics   FEN: NPO, NGT to LIWS (discussed w/ RN - flush blue port with air every 4h, if not working then flush clear tubing w/ tap water or normal saline), ok to have limited ice chips for comfort ID: none VTE: SCD's, Lovenox  Foley: none Dispo: med-surg, bowel rest, surgical planing   HTN    LOS: 2 days    Hosie Spangle, [facility] Surgery Please see Amion for pager number during day hours 7:00am-4:30pm

## 2021-02-04 NOTE — Progress Notes (Signed)
PROGRESS NOTE    SATOYA FEELEY  IZT:[id number] DOB: 10/05/1940 DOA: 02/02/2021 PCP: [name], MD  Brief Narrative:  The patient is a 80 year old Caucasian female with a past medical history significant for but not limited to asthma, anemia, BPPV, DDD, GERD, hypertension, hyperlipidemia, history of SBO status post partial small bowel resection as well as other comorbidities who came in with new onset abdominal pain.  She states the symptoms started the day before yesterday and they felt like a cramping lower abdominal pain.  She felt nauseous and started vomiting stomach contents several times this was nonbloody nonbilious.  She states that she had a small bowel movement yesterday morning and that the abdominal pain became constant and more severe.  She went to go see her PCP who recommended that she come to the ED.  She went under further evaluation and had a CT of the abdomen pelvis which showed a high-grade small bowel obstruction to the level of a transection point in the mid pelvis.  Her labs were relatively unremarkable however she did have a hypercalcemia.  General surgery was consulted for further evaluation recommendations and she is undergoing a small bowel protocol and has an NG tube in place.  Because she failed conservative measures and was worse today with more abdominal distention General surgery is planning to take him to the OR today for exploratory laparotomy  Assessment & Plan:   Active Problems:   SBO (small bowel obstruction) (HCC)  Small bowel obstruction in the setting of adhesions from prior appendectomy -She is admitted to MedSurg -She has had a exploratory laparotomy previously with small bowel resection for previous small bowel obstruction that was done in 2013 by Dr. Derrell Lolling -NG tube was placed and connected to low intermittent suction and is now she is undergoing a small bowel protocol today which showed no definite contrast in the colon -She was initiated on IV  fluid hydration with NS at 100 mL/hr -She will be given pain medications with Hydromorphone 0.5-1 mg IV q2hprn Severe Pain and Morphine 2 mg IV q4hprn Breakthrough Pain -Continue supportive care and Antiemetics with po/IV q6hprn Nausea -Ensure magnesium level is above 2 and potassium level is above 4; Last K+ was was 4.4 and Mag level is 2.3 -KUB this AM showed "Mildly dilated small bowel loops are noted concerning for ileus or possibly distal small bowel obstruction."  Hypercalcemia -Likely from severe dehydration and hemoconcentration -IV fluid hydration be given and calcium levels improved -Ca2+ went from 10.4 -> 9.2 -> 9.0 -Continue monitor and trend and repeat CMP in the a.m.  Leukocytosis -Likely reactive in the setting of above -WBC is trending down and went from 19.3 -> 13.0 -> 11.3 -Lactic acid level was 1.8 and improved to 1.7 -Continue to monitor for signs and symptoms of infection -Repeat CBC in a.m.  Hyperbilirubinemia -Mild and likely reactive. T Bil was 1.3 and improved to 1.0 -Continue to Monitor and Trend -Repeat CMP in the AM  Hyperglycemia -Blood Sugar on Admission was 163 and repeat this AM was 132 -Check HbA1c in the AM -Continue to Monitor BS per Protocol  Hyponatremia -Mild and Improving. Na+ went from 132 -> 133 -> 138 -C/w IVF as above -Continue to Monitor and Trend -Repeat CBC in the AM  Hypertension -Holding all blood pressure medications given that she has NG tube in place (Amlodipine 5 mg po Daily and Lisinopril 20 mg po Daily -Added IV Hydralazine 5 mg IV q6hprn SBP >150 or DBP >100 -  We will consider changing amlodipine to avoid possible side effect of constipation from the calcium channel blocker -Continue to monitor blood pressures per protocol -Last blood pressure reading was 158/77  DVT prophylaxis: Enoxaparin 40 mg sq q24h Code Status: FULL CODE Family Communication: Discussed with family present at bedside (Husband and  Daughter) Disposition Plan: Pending further clinical improvement   Status is: Inpatient  Remains inpatient appropriate because: Still has an obstruction and Abdominal Distention and needs Surgical Clearance  Consultants:  General Surgery  Procedures: Small Bowel Protocol   Antimicrobials:  Anti-infectives (From admission, onward)    None        Subjective: Seen and examined at bedside and has not been improving and her abdomen looks more distended.  She has failed conservative measures so general surgery planning for exploratory laparotomy today.  She continues to have some abdominal pain and felt queasy and nauseous and does not feel that she is getting any better.  She denies any flatus or bowel movements.  No chest pain or shortness of breath.  No other concerns or plans at this time.  Objective: Vitals:   02/03/21 1631 02/03/21 2010 02/04/21 0407 02/04/21 0801  BP: (!) 150/82 138/75 (!) 154/80 (!) 158/77  Pulse: 81 81 82 87  Resp: 19 17 16 18   Temp: 98.6 F (37 C) 97.9 F (36.6 C) 98.6 F (37 C) 98.3 F (36.8 C)  TempSrc: Oral Oral Oral Oral  SpO2: 94% 95% 96% 94%  Weight:      Height:        Intake/Output Summary (Last 24 hours) at 02/04/2021 0908 Last data filed at 02/04/2021 0409 Gross per 24 hour  Intake 714.9 ml  Output 1150 ml  Net -435.1 ml    Filed Weights   02/02/21 1205  Weight: 58 kg   Examination: Physical Exam:  Constitutional: Thin elderly Caucasian female currently who appears slightly uncomfortable  Eyes: Lids and conjunctivae normal, sclerae anicteric  ENMT: External Ears, Nose appear normal. Grossly normal hearing. Neck: Appears normal, supple, no cervical masses, normal ROM, no appreciable thyromegaly; no JVD Respiratory: Diminished to auscultation bilaterally with coarse, no wheezing, rales, rhonchi or crackles. Normal respiratory effort and patient is not tachypenic. No accessory muscle use. Unlabored breathing  Cardiovascular: RRR,  no murmurs / rubs / gallops. S1 and S2 auscultated.  Abdomen: Soft, Tender to palpate, Distended 2/2 body habitus. Bowel sounds diminished   GU: Deferred. Musculoskeletal: No clubbing / cyanosis of digits/nails. No joint deformity upper and lower extremities.  Skin: No rashes, lesions, ulcers on a limited skin evaluation. No induration; Warm and dry.  Neurologic: CN 2-12 grossly intact with no focal deficits. Romberg sign and cerebellar reflexes not assessed.  Psychiatric: Normal judgment and insight. Alert and oriented x 3. Normal mood and appropriate affect.   Data Reviewed: I have personally reviewed following labs and imaging studies  CBC: Recent Labs  Lab 02/02/21 1148 02/03/21 0113 02/04/21 0027  WBC 19.3* 13.0* 11.3*  NEUTROABS 17.1* 10.6* 8.9*  HGB 15.0 14.8 14.5  HCT 45.1 43.2 44.2  MCV 93.8 92.3 96.7  PLT 341 282 286    Basic Metabolic Panel: Recent Labs  Lab 02/02/21 1148 02/03/21 0113 02/04/21 0027  NA 132* 133* 138  K 4.8 4.6 4.4  CL 93* 96* 99  CO2 28 27 29   GLUCOSE 163* 132* 102*  BUN 22 25* 24*  CREATININE 0.78 0.79 0.67  CALCIUM 10.4* 9.2 9.0  MG  --   --  2.3  PHOS  --   --  3.4    GFR: Estimated Creatinine Clearance: 49.6 mL/min (by C-G formula based on SCr of 0.67 mg/dL). Liver Function Tests: Recent Labs  Lab 02/02/21 1148 02/04/21 0027  AST 29 23  ALT 25 16  ALKPHOS 64 47  BILITOT 1.3* 1.0  PROT 8.4* 6.6  ALBUMIN 4.6 3.4*    Recent Labs  Lab 02/02/21 1148  LIPASE 48    No results for input(s): AMMONIA in the last 168 hours. Coagulation Profile: No results for input(s): INR, PROTIME in the last 168 hours. Cardiac Enzymes: No results for input(s): CKTOTAL, CKMB, CKMBINDEX, TROPONINI in the last 168 hours. BNP (last 3 results) No results for input(s): PROBNP in the last 8760 hours. HbA1C: No results for input(s): HGBA1C in the last 72 hours. CBG: Recent Labs  Lab 02/03/21 1856  GLUCAP 117*   Lipid Profile: No results  for input(s): CHOL, HDL, LDLCALC, TRIG, CHOLHDL, LDLDIRECT in the last 72 hours. Thyroid Function Tests: No results for input(s): TSH, T4TOTAL, FREET4, T3FREE, THYROIDAB in the last 72 hours. Anemia Panel: No results for input(s): VITAMINB12, FOLATE, FERRITIN, TIBC, IRON, RETICCTPCT in the last 72 hours. Sepsis Labs: Recent Labs  Lab 02/02/21 1520 02/02/21 1809  LATICACIDVEN 1.8 1.7     Recent Results (from the past 240 hour(s))  Resp Panel by RT-PCR (Flu A&B, Covid) Nasopharyngeal Swab     Status: None   Collection Time: 02/02/21  2:50 PM   Specimen: Nasopharyngeal Swab; Nasopharyngeal(NP) swabs in vial transport medium  Result Value Ref Range Status   SARS Coronavirus 2 by RT PCR NEGATIVE NEGATIVE Final    Comment: (NOTE) SARS-CoV-2 target nucleic acids are NOT DETECTED.  The SARS-CoV-2 RNA is generally detectable in upper respiratory specimens during the acute phase of infection. The lowest concentration of SARS-CoV-2 viral copies this assay can detect is 138 copies/mL. A negative result does not preclude SARS-Cov-2 infection and should not be used as the sole basis for treatment or other patient management decisions. A negative result may occur with  improper specimen collection/handling, submission of specimen other than nasopharyngeal swab, presence of viral mutation(s) within the areas targeted by this assay, and inadequate number of viral copies(<138 copies/mL). A negative result must be combined with clinical observations, patient history, and epidemiological information. The expected result is Negative.  Fact Sheet for Patients:  BloggerCourse.com  Fact Sheet for Healthcare Providers:  SeriousBroker.it  This test is no t yet approved or cleared by the [place] FDA and  has been authorized for detection and/or diagnosis of SARS-CoV-2 by FDA under an Emergency Use Authorization (EUA). This EUA will remain  in  effect (meaning this test can be used) for the duration of the COVID-19 declaration under Section 564(b)(1) of the Act, 21 U.S.C.section 360bbb-3(b)(1), unless the authorization is terminated  or revoked sooner.       Influenza A by PCR NEGATIVE NEGATIVE Final   Influenza B by PCR NEGATIVE NEGATIVE Final    Comment: (NOTE) The Xpert Xpress SARS-CoV-2/FLU/RSV plus assay is intended as an aid in the diagnosis of influenza from Nasopharyngeal swab specimens and should not be used as a sole basis for treatment. Nasal washings and aspirates are unacceptable for Xpert Xpress SARS-CoV-2/FLU/RSV testing.  Fact Sheet for Patients: BloggerCourse.com  Fact Sheet for Healthcare Providers: SeriousBroker.it  This test is not yet approved or cleared by the [place] FDA and has been authorized for detection and/or diagnosis of SARS-CoV-2 by FDA under an Emergency  Use Authorization (EUA). This EUA will remain in effect (meaning this test can be used) for the duration of the COVID-19 declaration under Section 564(b)(1) of the Act, 21 U.S.C. section 360bbb-3(b)(1), unless the authorization is terminated or revoked.  Performed at [facility], [address], [place], [place] [address]      RN Pressure Injury Documentation:     Estimated body mass index is 19.44 kg/m as calculated from the following:   Height as of this encounter: 5\' 8"  (1.727 m).   Weight as of this encounter: 58 kg.  Malnutrition Type:   Malnutrition Characteristics:   Nutrition Interventions:    Radiology Studies: DG Abd 1 View  Result Date: 02/04/2021 CLINICAL DATA:  Nausea, vomiting. EXAM: ABDOMEN - 1 VIEW COMPARISON:  February 03, 2021. FINDINGS: Nasogastric tube tip is seen in proximal stomach. Mildly dilated small bowel loops are noted concerning for ileus or possibly distal small bowel obstruction. No colonic dilatation is noted. IMPRESSION: Mildly  dilated small bowel loops are noted concerning for ileus or possibly distal small bowel obstruction. Electronically Signed   By: Lupita Raider M.D.   On: 02/04/2021 08:28   CT Abdomen Pelvis W Contrast  Result Date: 02/02/2021 CLINICAL DATA:  Abdominal pain, tenderness EXAM: CT ABDOMEN AND PELVIS WITH CONTRAST TECHNIQUE: Multidetector CT imaging of the abdomen and pelvis was performed using the standard protocol following bolus administration of intravenous contrast. CONTRAST:  OMNIPAQUE IOHEXOL 300 MG/ML  SOLN COMPARISON:  04/10/2020 FINDINGS: Lower chest: No pleural or pericardial effusion. Fluid in the nondilated distal esophagus. Visualized lung bases normal. Hepatobiliary: No focal liver abnormality is seen. No gallstones, gallbladder wall thickening, or biliary dilatation. Pancreas: Unremarkable. No pancreatic ductal dilatation or surrounding inflammatory changes. Spleen: Normal in size without focal abnormality. Adrenals/Urinary Tract: Normal adrenal glands. No urolithiasis hydronephrosis. Left parapelvic renal cysts as before. Urinary bladder nondistended. Stomach/Bowel: Gastric body and fundus distended by fluid. Duodenal periampullary diverticulum. Multiple dilated proximal and mid small bowel loops. Transition point in the pelvis. Distal ileal loops are decompressed through the terminal ileum. The colon is nondistended, unremarkable. Vascular/Lymphatic: Mild calcified aortic plaque without aneurysm. No abdominal or pelvic adenopathy. Reproductive: Uterus and bilateral adnexa are unremarkable. Other: Bilateral pelvic phleboliths.  No ascites.  No free air. Musculoskeletal: Spondylitic changes in the lower lumbar spine. No fracture or worrisome bone lesion. IMPRESSION: 1. High-grade distal small bowel obstruction, transition point in the mid pelvis. No associated mass or lesion is evident, suggesting adhesions. 2.  Aortic Atherosclerosis (ICD10-170.0). Electronically Signed   By: Corlis Leak  M.D.   On: 02/02/2021 13:58   DG Abd Portable 1V-Small Bowel Obstruction Protocol-initial, 8 hr delay  Result Date: 02/03/2021 CLINICAL DATA:  Small-bowel obstruction EXAM: PORTABLE ABDOMEN - 1 VIEW COMPARISON:  Earlier same day.  02/02/2021. FINDINGS: Nasogastric tube remains in the stomach. Contrast fills the fundus of the stomach. Previously administered contrast shows moderate progression throughout dilated small bowel. Contrast has not yet reached the colon. IMPRESSION: Administered contrast shows moderate progression through dilated small bowel but has not yet reached the colon. Electronically Signed   By: Paulina Fusi M.D.   On: 02/03/2021 17:16   DG Abd Portable 1V-Small Bowel Protocol-Position Verification  Result Date: 02/03/2021 CLINICAL DATA:  80 year old female with nasogastric tube in place. EXAM: PORTABLE ABDOMEN - 1 VIEW COMPARISON:  Earlier the same day FINDINGS: Gastric decompression tube has a redundant course, coiled in the gastric body with the tip near the gastroesophageal junction, similar  to comparison. Scattered gaseous containing loops of small bowel are again visualized without significant distension. The colon is relatively decompressed. IMPRESSION: Indwelling gastric decompression tube remains coiled in the gastric body. Nonspecific bowel gas pattern with persistent scattered loops of gas-filled small bowel. Electronically Signed   By: Marliss Coots M.D.   On: 02/03/2021 11:05   DG Abd Portable 1V-Small Bowel Obstruction Protocol-initial, 8 hr delay  Result Date: 02/03/2021 CLINICAL DATA:  Small-bowel obstruction.  8 hour delayed film. EXAM: PORTABLE ABDOMEN - 1 VIEW COMPARISON:  Abdominal radiograph dated 02/02/2021. FINDINGS: Similar positioning of the enteric tube. Persistent dilatation of small-bowel loops measuring approximately 3.3 cm in the left lower quadrant. Excreted contrast noted in the urinary bladder. Degenerative changes of the spine. No acute osseous  pathology. IMPRESSION: Persistent dilatation of small-bowel loops in the left lower quadrant. Electronically Signed   By: Elgie Collard M.D.   On: 02/03/2021 00:53   DG Abd Portable 1V-Small Bowel Protocol-Position Verification  Result Date: 02/02/2021 CLINICAL DATA:  Abdominal pain EXAM: PORTABLE ABDOMEN - 1 VIEW COMPARISON:  X-ray abdomen 11/20/2011, CT abdomen pelvis 02/02/2021 FINDINGS: Enteric tube noted coursing below the hemidiaphragm with tip and side port overlying the expected region of the gastric lumen. Excreted intravenous contrast noted within bilateral collecting systems. Some distended small bowel loops are noted in the left mid abdomen but not well visualized. IMPRESSION: Enteric tube in good position.  Could consider retraction by 5 cm. Electronically Signed   By: Tish Frederickson M.D.   On: 02/02/2021 16:12    Scheduled Meds:  enoxaparin (LOVENOX) injection  40 mg Subcutaneous Q24H   mouth rinse  15 mL Mouth Rinse BID   Continuous Infusions:  sodium chloride 100 mL/hr at 02/04/21 0154    LOS: 2 days   Merlene Laughter, DO [facility] PAGER is on AMION  If 7PM-7AM, please contact night-coverage [email or web address]

## 2021-02-04 NOTE — Plan of Care (Signed)
  Problem: Pain Managment: Goal: General experience of comfort will improve Outcome: Progressing   

## 2021-02-04 NOTE — Anesthesia Preprocedure Evaluation (Addendum)
Anesthesia Evaluation  Patient identified by MRN, date of birth, ID band Patient awake    Reviewed: Allergy & Precautions, NPO status , Patient's Chart, lab work & pertinent test results  Airway Mallampati: III  TM Distance: <3 FB Neck ROM: Full    Dental  (+) Teeth Intact, Dental Advisory Given   Pulmonary asthma ,    Pulmonary exam normal breath sounds clear to auscultation       Cardiovascular hypertension, Pt. on medications Normal cardiovascular exam Rhythm:Regular Rate:Normal     Neuro/Psych negative neurological ROS     GI/Hepatic Neg liver ROS, GERD  ,SBO   Endo/Other  negative endocrine ROS  Renal/GU negative Renal ROS     Musculoskeletal negative musculoskeletal ROS (+)   Abdominal   Peds  Hematology negative hematology ROS (+)   Anesthesia Other Findings Day of surgery medications reviewed with the patient.  Reproductive/Obstetrics                            Anesthesia Physical Anesthesia Plan  ASA: 3  Anesthesia Plan: General   Post-op Pain Management:    Induction: Intravenous  PONV Risk Score and Plan: 4 or greater and Dexamethasone, Ondansetron and Treatment may vary due to age or medical condition  Airway Management Planned: Oral ETT and Video Laryngoscope Planned  Additional Equipment:   Intra-op Plan:   Post-operative Plan: Possible Post-op intubation/ventilation  Informed Consent: I have reviewed the patients History and Physical, chart, labs and discussed the procedure including the risks, benefits and alternatives for the proposed anesthesia with the patient or authorized representative who has indicated his/her understanding and acceptance.     Dental advisory given  Plan Discussed with: CRNA  Anesthesia Plan Comments: (2nd PIV)       Anesthesia Quick Evaluation

## 2021-02-04 NOTE — Anesthesia Procedure Notes (Signed)
Procedure Name: Intubation Date/Time: 02/04/2021 12:50 PM Performed by: Elliot Dally, CRNA Pre-anesthesia Checklist: Patient identified, Emergency Drugs available, Suction available and Patient being monitored Patient Re-evaluated:Patient Re-evaluated prior to induction Oxygen Delivery Method: Circle System Utilized Preoxygenation: Pre-oxygenation with 100% oxygen Induction Type: IV induction Laryngoscope Size: Glidescope and 3 Grade View: Grade I Tube type: Oral Tube size: 7.0 mm Number of attempts: 1 Airway Equipment and Method: Stylet and Oral airway Placement Confirmation: ETT inserted through vocal cords under direct vision, positive ETCO2 and breath sounds checked- equal and bilateral Secured at: 22 cm Tube secured with: Tape Dental Injury: Teeth and Oropharynx as per pre-operative assessment

## 2021-02-04 NOTE — Op Note (Signed)
Operative Note  [facility]  [id number]  [id number]  02/04/2021   Surgeon: Phylliss Blakes MD FACS   Assistant: Carlena Bjornstad PA-C   Procedure performed: Exploratory laparotomy, lysis of adhesions x80 minutes   Preop diagnosis: Small bowel Post-op diagnosis/intraop findings: Same, extensive adhesive disease with obstructing band noted in the right lower quadrant   Specimens: none Retained items: none  EBL: 30cc Complications: none   Description of procedure: After obtaining informed consent the patient was taken to the operating room and placed supine on operating room table where general endotracheal anesthesia was initiated, preoperative antibiotics were administered, SCDs applied, and a formal timeout was performed.  The abdomen was prepped and draped in usual sterile fashion and a midline vertical laparotomy created.  The peritoneal cavity was carefully entered above the umbilicus and above the region of her previous operations.  Immediately noted for dense adhesions of the colon and small bowel to the anterior abdominal wall along the midline extending into the pelvis and bilateral lower quadrants.  Combination of careful cautery and sharp dissection with the Metzenbaums ensued which was quite tedious, in order to free the abdominal wall of all adhesions and deliver the bowel into the field.  Numerous interloop adhesions were present and some of these were taken down in order to ensure that we had accurate anatomy.  Once the bowel was able to be fully eviscerated there was noted to be a tethering band in the right lower quadrant obstructing the terminal ileum about 40 cm from the ileocecal valve.  Once this was lysed the obstruction was confirmed to be relieved.  There was a mildly strictured area with some ecchymosis of the bowel wall.  This was left alone for several minutes while we were in the small bowel.  The ligament of Treitz was identified and the bowel run proximally in its  entirety confirming no additional adhesions and no twisting of the mesentery or bowel injury.  Once this was completed the area of stricture and obstruction was again inspected and noted to be much healthier appearing and obviously widely patent.  The colon was filled with very firm rocklike pieces of stool but this was all mobile within the lumen.  The NG tube is palpated in the stomach in correct location.  All the bowel was returned to the abdominal cavity in correct orientation.  The abdomen was irrigated with warm sterile saline and the effluent was clear. Seprafilm was placed over the viscera along the midline.  The fascia was then closed with running looped #1 PDS starting at either end and tying centrally.  The skin was closed with staples and a honeycomb dressing applied.  The patient was then awakened, extubated and taken to PACU in stable condition.    All counts were correct at the completion of the case.

## 2021-02-05 ENCOUNTER — Encounter (HOSPITAL_COMMUNITY): Payer: Self-pay | Admitting: Surgery

## 2021-02-05 DIAGNOSIS — K56609 Unspecified intestinal obstruction, unspecified as to partial versus complete obstruction: Secondary | ICD-10-CM | POA: Diagnosis not present

## 2021-02-05 DIAGNOSIS — D72829 Elevated white blood cell count, unspecified: Secondary | ICD-10-CM | POA: Diagnosis not present

## 2021-02-05 DIAGNOSIS — R739 Hyperglycemia, unspecified: Secondary | ICD-10-CM | POA: Diagnosis not present

## 2021-02-05 LAB — CBC WITH DIFFERENTIAL/PLATELET
Abs Immature Granulocytes: 0.04 10*3/uL (ref 0.00–0.07)
Basophils Absolute: 0 10*3/uL (ref 0.0–0.1)
Basophils Relative: 0 %
Eosinophils Absolute: 0 10*3/uL (ref 0.0–0.5)
Eosinophils Relative: 0 %
HCT: 45 % (ref 36.0–46.0)
Hemoglobin: 14.7 g/dL (ref 12.0–15.0)
Immature Granulocytes: 0 %
Lymphocytes Relative: 8 %
Lymphs Abs: 0.9 10*3/uL (ref 0.7–4.0)
MCH: 32.1 pg (ref 26.0–34.0)
MCHC: 32.7 g/dL (ref 30.0–36.0)
MCV: 98.3 fL (ref 80.0–100.0)
Monocytes Absolute: 1.3 10*3/uL — ABNORMAL HIGH (ref 0.1–1.0)
Monocytes Relative: 11 %
Neutro Abs: 9.2 10*3/uL — ABNORMAL HIGH (ref 1.7–7.7)
Neutrophils Relative %: 81 %
Platelets: 301 10*3/uL (ref 150–400)
RBC: 4.58 MIL/uL (ref 3.87–5.11)
RDW: 12.5 % (ref 11.5–15.5)
WBC: 11.4 10*3/uL — ABNORMAL HIGH (ref 4.0–10.5)
nRBC: 0 % (ref 0.0–0.2)

## 2021-02-05 LAB — COMPREHENSIVE METABOLIC PANEL
ALT: 13 U/L (ref 0–44)
AST: 19 U/L (ref 15–41)
Albumin: 2.8 g/dL — ABNORMAL LOW (ref 3.5–5.0)
Alkaline Phosphatase: 37 U/L — ABNORMAL LOW (ref 38–126)
Anion gap: 5 (ref 5–15)
BUN: 26 mg/dL — ABNORMAL HIGH (ref 8–23)
CO2: 29 mmol/L (ref 22–32)
Calcium: 8.3 mg/dL — ABNORMAL LOW (ref 8.9–10.3)
Chloride: 105 mmol/L (ref 98–111)
Creatinine, Ser: 0.73 mg/dL (ref 0.44–1.00)
GFR, Estimated: >60 mL/min (ref >60)
Glucose, Bld: 116 mg/dL — ABNORMAL HIGH (ref 70–99)
Potassium: 5 mmol/L (ref 3.5–5.1)
Sodium: 139 mmol/L (ref 135–145)
Total Bilirubin: 0.8 mg/dL (ref 0.3–1.2)
Total Protein: 5.5 g/dL — ABNORMAL LOW (ref 6.5–8.1)

## 2021-02-05 LAB — HEMOGLOBIN A1C
Hgb A1c MFr Bld: 5.3 % (ref 4.8–5.6)
Mean Plasma Glucose: 105.41 mg/dL

## 2021-02-05 LAB — MAGNESIUM: Magnesium: 2.2 mg/dL (ref 1.7–2.4)

## 2021-02-05 LAB — PHOSPHORUS: Phosphorus: 3.6 mg/dL (ref 2.5–4.6)

## 2021-02-05 MED ORDER — DEXTROSE-NACL 5-0.9 % IV SOLN: INTRAVENOUS | Status: AC

## 2021-02-05 MED ORDER — KCL IN DEXTROSE-NACL 20-5-0.9 MEQ/L-%-% IV SOLN: INTRAVENOUS | Status: DC

## 2021-02-05 NOTE — Progress Notes (Signed)
[facility] Progress Note  1 Day Post-Op  Subjective: CC:  Pain controlled. Denies flatus or stool yet. Wants to walk today in the halls. Eager for NG tube to be removed due to throat discomfort.  Objective: Vital signs in last 24 hours: Temp:  [97.6 F (36.4 C)-98.6 F (37 C)] 98.5 F (36.9 C) (10/25 0814) Pulse Rate:  [84-92] 90 (10/25 0814) Resp:  [12-20] 17 (10/25 0814) BP: (109-178)/(66-97) 147/66 (10/25 0814) SpO2:  [93 %-100 %] 95 % (10/25 0814) Last BM Date:  (Unknown)  Intake/Output from previous day: 10/24 0701 - 10/25 0700 In: 1650 [I.V.:1500; NG/GT:100] Out: 385 [Urine:135; Emesis/NG output:200; Blood:50] Intake/Output this shift: No intake/output data recorded.  PE: Gen:  Alert, NAD, appears uncomfortable, pleasant and cooperative, appears younger than stated age. Card:  Regular rate and rhythm, pedal pulses 2+ BL Pulm:  Normal effort, clear to auscultation bilaterally Abd: Soft, moderately distended and tympanic, appropriately tender, incision w. Honeycomb dressing with small amt sanguinous strikethrough but no significant bleeding, hypoactive BS Skin: warm and dry, no rashes  Psych: A&Ox3   Lab Results:  Recent Labs    02/04/21 0027 02/05/21 0118  WBC 11.3* 11.4*  HGB 14.5 14.7  HCT 44.2 45.0  PLT 286 301   BMET Recent Labs    02/04/21 0027 02/05/21 0118  NA 138 139  K 4.4 5.0  CL 99 105  CO2 29 29  GLUCOSE 102* 116*  BUN 24* 26*  CREATININE 0.67 0.73  CALCIUM 9.0 8.3*   PT/INR No results for input(s): LABPROT, INR in the last 72 hours. CMP     Component Value Date/Time   NA 139 02/05/2021 0118   K 5.0 02/05/2021 0118   CL 105 02/05/2021 0118   CO2 29 02/05/2021 0118   GLUCOSE 116 (H) 02/05/2021 0118   BUN 26 (H) 02/05/2021 0118   CREATININE 0.73 02/05/2021 0118   CREATININE 0.50 12/22/2011 1045   CALCIUM 8.3 (L) 02/05/2021 0118   PROT 5.5 (L) 02/05/2021 0118   ALBUMIN 2.8 (L) 02/05/2021 0118   AST 19 02/05/2021  0118   ALT 13 02/05/2021 0118   ALKPHOS 37 (L) 02/05/2021 0118   BILITOT 0.8 02/05/2021 0118   GFRNONAA >60 02/05/2021 0118   GFRAA >90 11/24/2011 0409   Lipase     Component Value Date/Time   LIPASE 48 02/02/2021 1148       Studies/Results: DG Abd 1 View  Result Date: 02/04/2021 CLINICAL DATA:  Nausea, vomiting. EXAM: ABDOMEN - 1 VIEW COMPARISON:  February 03, 2021. FINDINGS: Nasogastric tube tip is seen in proximal stomach. Mildly dilated small bowel loops are noted concerning for ileus or possibly distal small bowel obstruction. No colonic dilatation is noted. IMPRESSION: Mildly dilated small bowel loops are noted concerning for ileus or possibly distal small bowel obstruction. Electronically Signed   By: Lupita Raider M.D.   On: 02/04/2021 08:28   DG Abd Portable 1V-Small Bowel Obstruction Protocol-initial, 8 hr delay  Result Date: 02/03/2021 CLINICAL DATA:  Small-bowel obstruction EXAM: PORTABLE ABDOMEN - 1 VIEW COMPARISON:  Earlier same day.  02/02/2021. FINDINGS: Nasogastric tube remains in the stomach. Contrast fills the fundus of the stomach. Previously administered contrast shows moderate progression throughout dilated small bowel. Contrast has not yet reached the colon. IMPRESSION: Administered contrast shows moderate progression through dilated small bowel but has not yet reached the colon. Electronically Signed   By: Paulina Fusi M.D.   On: 02/03/2021 17:16    Anti-infectives: Anti-infectives (  From admission, onward)    None        Assessment/Plan  SBO, recurrent POD#1 s/p exploratory laparotomy lysis of adhesions 02/04/21 Dr.Connor - prior [facility] - open appendectomy, ex lap/SBR for prior SBO 2013 Dr. Derrell Lolling  - continue NG tube to LIWS, await bowel function; still with dark bilious NG output. May have post-op ileus.  - mobilize - PRN analgesics   FEN: NPO, NGT to LIWS, ok to have limited ice chips for comfort  ID: none VTE: SCD's, Lovenox  Foley:  none Dispo: med-surg, await bowel function.  HTN    LOS: 3 days    Hosie Spangle, [facility] Please see Amion for pager number during day hours 7:00am-4:30pm

## 2021-02-05 NOTE — Anesthesia Postprocedure Evaluation (Signed)
Anesthesia Post Note  Patient: Dana Hall  Procedure(s) Performed: EXPLORATORY LAPAROTOMY (Abdomen) LYSIS OF ADHESION WITH ADHESION BARRIER PLACED (Abdomen)     Patient location during evaluation: PACU Anesthesia Type: General Level of consciousness: awake and alert and oriented Pain management: pain level controlled Vital Signs Assessment: post-procedure vital signs reviewed and stable Respiratory status: spontaneous breathing, nonlabored ventilation and respiratory function stable Cardiovascular status: blood pressure returned to baseline Postop Assessment: no apparent nausea or vomiting Anesthetic complications: no   No notable events documented.         Shanda Howells

## 2021-02-05 NOTE — Care Management Important Message (Signed)
Important Message  Patient Details  Name: Dana Hall MRN: [id number] Date of Birth: 06-29-1940   Medicare Important Message Given:  Yes     Dorena Bodo 02/05/2021, 4:05 PM

## 2021-02-05 NOTE — Progress Notes (Signed)
PROGRESS NOTE    Dana Hall  ZOX:[id number] DOB: Aug 31, 1940 DOA: 02/02/2021 PCP: [name], MD  Brief Narrative:  The patient is a 80 year old Caucasian female with a past medical history significant for but not limited to asthma, anemia, BPPV, DDD, GERD, hypertension, hyperlipidemia, history of SBO status post partial small bowel resection as well as other comorbidities who came in with new onset abdominal pain.  She states the symptoms started the day before yesterday and they felt like a cramping lower abdominal pain.  She felt nauseous and started vomiting stomach contents several times this was nonbloody nonbilious.  She states that she had a small bowel movement yesterday morning and that the abdominal pain became constant and more severe.  She went to go see her PCP who recommended that she come to the ED.  She went under further evaluation and had a CT of the abdomen pelvis which showed a high-grade small bowel obstruction to the level of a transection point in the mid pelvis.  Her labs were relatively unremarkable however she did have a hypercalcemia.  General surgery was consulted for further evaluation recommendations and she is undergoing a small bowel protocol and has an NG tube in place.  Because she failed conservative measures and was worse today with more abdominal distention General surgery took the patient for an exploratory laparotomy and she had lysis of adhesions x80 minutes.  General surgery recommends continuing NG tube to low and intermittent wall suction and await bowel function return as the patient continues to have dark bilious NG output.  They are recommending mobilization as ileus is anticipated.  Assessment & Plan:   Active Problems:   SBO (small bowel obstruction) (HCC)  Small bowel obstruction in the setting of adhesions from prior appendectomy -She is admitted to MedSurg -She has had a exploratory laparotomy previously with small bowel resection for  previous small bowel obstruction that was done in 2013 by Dr. Derrell Lolling -NG tube was placed and connected to low intermittent suction and is now she is undergoing a small bowel protocol today which showed no definite contrast in the colon -She was initiated on IV fluid hydration with NS at 100 mL/hr -She will be given pain medications with Hydromorphone 0.5-1 mg IV q2hprn Severe Pain and Morphine 2 mg IV q4hprn Breakthrough Pain -Continue supportive care and Antiemetics with po/IV q6hprn Nausea -Ensure magnesium level is above 2 and potassium level is above 4; Last K+ was was 5.0 and Mag level is 2.2 -KUB yesterday AM showed "Mildly dilated small bowel loops are noted concerning for ileus or possibly distal small bowel obstruction." -She was taken to the OR yesterday for an exploratory laparotomy as well as lysis of adhesions for 80 minutes -Restarted IVF D5 NS at 75 mL/hr -Surgery is recommending continuing NG tube to low and intermittent wall suction and await bowel function return as the patient continues to have dark bilious NG output.  They are recommending mobilization as ileus is anticipated.  Hypercalcemia -Likely from severe dehydration and hemoconcentration -IV fluid hydration be given and calcium levels improved; Restarted IVF with D5 NS at 75 mL/hr -Ca2+ went from 10.4 -> 9.2 -> 9.0 -> 8.3 -Continue monitor and trend and repeat CMP in the a.m.  Leukocytosis -Likely reactive in the setting of above -WBC is trending down and went from 19.3 -> 13.0 -> 11.3 -> 11.4 -Lactic acid level was 1.8 and improved to 1.7 -Continue to monitor for signs and symptoms of infection -Repeat CBC in  a.m.  Hyperbilirubinemia -Mild and likely reactive. T Bil was 1.3 and improved to 1.0 yesterday and was 0.8 today  -Continue to Monitor and Trend -Repeat CMP in the AM  Hyperglycemia -Blood Sugar on Admission was 163 and repeat this AM was 132 -Checked HbA1c and was 5.3 -Continue to Monitor BS per  Protocol  Hyponatremia -Mild and Improving. Na+ went from 132 -> 133 -> 138 -> 139 -C/w IVF as above -Continue to Monitor and Trend -Repeat CBC in the AM  Hypertension -Holding all blood pressure medications given that she has NG tube in place (Amlodipine 5 mg po Daily and Lisinopril 20 mg po Daily -Added IV Hydralazine 5 mg IV q6hprn SBP >150 or DBP >100 -We will consider changing amlodipine to avoid possible side effect of constipation from the calcium channel blocker -Continue to monitor blood pressures per protocol -Last blood pressure reading was 141/71  DVT prophylaxis: Enoxaparin 40 mg sq q24h Code Status: FULL CODE Family Communication: Discussed with family present at bedside (Husband and Daughter) Disposition Plan: Pending further clinical improvement   Status is: Inpatient  Remains inpatient appropriate because: Still has an obstruction and Abdominal Distention and needs Surgical Clearance  Consultants:  General Surgery  Procedures: Small Bowel Protocol   Antimicrobials:  Anti-infectives (From admission, onward)    None        Subjective: Seen and examined at bedside and plan was still having some abdominal pain.  Still not passing any flatus or having evaluated.  NG tube in place.  Was in a little bit of pain.  No other concerns or complaints at this time.  Objective: Vitals:   02/04/21 1615 02/04/21 2000 02/05/21 0430 02/05/21 0814  BP: (!) 163/80 (!) 109/97 134/73 (!) 147/66  Pulse: 92 86 84 90  Resp: 18   17  Temp: 98.2 F (36.8 C) 97.9 F (36.6 C) 98.6 F (37 C) 98.5 F (36.9 C)  TempSrc: Oral Oral Oral Oral  SpO2: 95% 93% 95% 95%  Weight:      Height:        Intake/Output Summary (Last 24 hours) at 02/05/2021 1247 Last data filed at 02/05/2021 0900 Gross per 24 hour  Intake 1550 ml  Output 385 ml  Net 1165 ml    Filed Weights   02/02/21 1205  Weight: 58 kg   Examination: Physical Exam:  Constitutional: Thin elderly Caucasian  female currently who is in mild discomfort ENMT: External Ears, Nose appear normal. Grossly normal hearing.  Has an NG tube in place Neck: Appears normal, supple, no cervical masses, normal ROM, no appreciable thyromegaly; no JVD Respiratory: Diminished to auscultation bilaterally with coarse breath sounds, no wheezing, rales, rhonchi or crackles. Normal respiratory effort and patient is not tachypenic. No accessory muscle use.  Unlabored breathing and not wearing any supplemental oxygen via nasal cannula Cardiovascular: RRR, no murmurs / rubs / gallops. S1 and S2 auscultated.  No appreciable extremity edema Abdomen: Soft, moderately-tender, Distended.  Has a midline abdominal incision with a honeycomb dressing. Bowel sounds are diminished.  GU: Deferred. Musculoskeletal: No clubbing / cyanosis of digits/nails. No joint deformity upper and lower extremities.  Skin: No rashes, lesions, ulcers on limited skin evaluation. No induration; Warm and dry.  Neurologic: CN 2-12 grossly intact with no focal deficits. Romberg sign and cerebellar reflexes not assessed.  Psychiatric: Normal judgment and insight. Alert and oriented x 3. Normal mood and appropriate affect.   Data Reviewed: I have personally reviewed following labs and imaging studies  CBC: Recent Labs  Lab 02/02/21 1148 02/03/21 0113 02/04/21 0027 02/05/21 0118  WBC 19.3* 13.0* 11.3* 11.4*  NEUTROABS 17.1* 10.6* 8.9* 9.2*  HGB 15.0 14.8 14.5 14.7  HCT 45.1 43.2 44.2 45.0  MCV 93.8 92.3 96.7 98.3  PLT 341 282 286 301    Basic Metabolic Panel: Recent Labs  Lab 02/02/21 1148 02/03/21 0113 02/04/21 0027 02/05/21 0118  NA 132* 133* 138 139  K 4.8 4.6 4.4 5.0  CL 93* 96* 99 105  CO2 28 27 29 29   GLUCOSE 163* 132* 102* 116*  BUN 22 25* 24* 26*  CREATININE 0.78 0.79 0.67 0.73  CALCIUM 10.4* 9.2 9.0 8.3*  MG  --   --  2.3 2.2  PHOS  --   --  3.4 3.6    GFR: Estimated Creatinine Clearance: 49.6 mL/min (by C-G formula based on  SCr of 0.73 mg/dL). Liver Function Tests: Recent Labs  Lab 02/02/21 1148 02/04/21 0027 02/05/21 0118  AST 29 23 19   ALT 25 16 13   ALKPHOS 64 47 37*  BILITOT 1.3* 1.0 0.8  PROT 8.4* 6.6 5.5*  ALBUMIN 4.6 3.4* 2.8*    Recent Labs  Lab 02/02/21 1148  LIPASE 48    No results for input(s): AMMONIA in the last 168 hours. Coagulation Profile: No results for input(s): INR, PROTIME in the last 168 hours. Cardiac Enzymes: No results for input(s): CKTOTAL, CKMB, CKMBINDEX, TROPONINI in the last 168 hours. BNP (last 3 results) No results for input(s): PROBNP in the last 8760 hours. HbA1C: Recent Labs    02/05/21 0118  HGBA1C 5.3   CBG: Recent Labs  Lab 02/03/21 1856  GLUCAP 117*    Lipid Profile: No results for input(s): CHOL, HDL, LDLCALC, TRIG, CHOLHDL, LDLDIRECT in the last 72 hours. Thyroid Function Tests: No results for input(s): TSH, T4TOTAL, FREET4, T3FREE, THYROIDAB in the last 72 hours. Anemia Panel: No results for input(s): VITAMINB12, FOLATE, FERRITIN, TIBC, IRON, RETICCTPCT in the last 72 hours. Sepsis Labs: Recent Labs  Lab 02/02/21 1520 02/02/21 1809  LATICACIDVEN 1.8 1.7     Recent Results (from the past 240 hour(s))  Resp Panel by RT-PCR (Flu A&B, Covid) Nasopharyngeal Swab     Status: None   Collection Time: 02/02/21  2:50 PM   Specimen: Nasopharyngeal Swab; Nasopharyngeal(NP) swabs in vial transport medium  Result Value Ref Range Status   SARS Coronavirus 2 by RT PCR NEGATIVE NEGATIVE Final    Comment: (NOTE) SARS-CoV-2 target nucleic acids are NOT DETECTED.  The SARS-CoV-2 RNA is generally detectable in upper respiratory specimens during the acute phase of infection. The lowest concentration of SARS-CoV-2 viral copies this assay can detect is 138 copies/mL. A negative result does not preclude SARS-Cov-2 infection and should not be used as the sole basis for treatment or other patient management decisions. A negative result may occur with   improper specimen collection/handling, submission of specimen other than nasopharyngeal swab, presence of viral mutation(s) within the areas targeted by this assay, and inadequate number of viral copies(<138 copies/mL). A negative result must be combined with clinical observations, patient history, and epidemiological information. The expected result is Negative.  Fact Sheet for Patients:  02/04/21  Fact Sheet for Healthcare Providers:  02/04/21  This test is no t yet approved or cleared by the 02/04/21 FDA and  has been authorized for detection and/or diagnosis of SARS-CoV-2 by FDA under an Emergency Use Authorization (EUA). This EUA will remain  in effect (meaning this test can  be used) for the duration of the COVID-19 declaration under Section 564(b)(1) of the Act, 21 U.S.C.section 360bbb-3(b)(1), unless the authorization is terminated  or revoked sooner.       Influenza A by PCR NEGATIVE NEGATIVE Final   Influenza B by PCR NEGATIVE NEGATIVE Final    Comment: (NOTE) The Xpert Xpress SARS-CoV-2/FLU/RSV plus assay is intended as an aid in the diagnosis of influenza from Nasopharyngeal swab specimens and should not be used as a sole basis for treatment. Nasal washings and aspirates are unacceptable for Xpert Xpress SARS-CoV-2/FLU/RSV testing.  Fact Sheet for Patients: BloggerCourse.com  Fact Sheet for Healthcare Providers: SeriousBroker.it  This test is not yet approved or cleared by the [place] FDA and has been authorized for detection and/or diagnosis of SARS-CoV-2 by FDA under an Emergency Use Authorization (EUA). This EUA will remain in effect (meaning this test can be used) for the duration of the COVID-19 declaration under Section 564(b)(1) of the Act, 21 U.S.C. section 360bbb-3(b)(1), unless the authorization is terminated  or revoked.  Performed at [facility], [address] [place], [place] [address]      RN Pressure Injury Documentation:     Estimated body mass index is 19.44 kg/m as calculated from the following:   Height as of this encounter: 5\' 8"  (1.727 m).   Weight as of this encounter: 58 kg.  Malnutrition Type:   Malnutrition Characteristics:   Nutrition Interventions:    Radiology Studies: DG Abd 1 View  Result Date: 02/04/2021 CLINICAL DATA:  Nausea, vomiting. EXAM: ABDOMEN - 1 VIEW COMPARISON:  February 03, 2021. FINDINGS: Nasogastric tube tip is seen in proximal stomach. Mildly dilated small bowel loops are noted concerning for ileus or possibly distal small bowel obstruction. No colonic dilatation is noted. IMPRESSION: Mildly dilated small bowel loops are noted concerning for ileus or possibly distal small bowel obstruction. Electronically Signed   By: February 05, 2021 M.D.   On: 02/04/2021 08:28   DG Abd Portable 1V-Small Bowel Obstruction Protocol-initial, 8 hr delay  Result Date: 02/03/2021 CLINICAL DATA:  Small-bowel obstruction EXAM: PORTABLE ABDOMEN - 1 VIEW COMPARISON:  Earlier same day.  02/02/2021. FINDINGS: Nasogastric tube remains in the stomach. Contrast fills the fundus of the stomach. Previously administered contrast shows moderate progression throughout dilated small bowel. Contrast has not yet reached the colon. IMPRESSION: Administered contrast shows moderate progression through dilated small bowel but has not yet reached the colon. Electronically Signed   By: 02/04/2021 M.D.   On: 02/03/2021 17:16    Scheduled Meds:  enoxaparin (LOVENOX) injection  40 mg Subcutaneous Q24H   mouth rinse  15 mL Mouth Rinse BID   Continuous Infusions:  acetaminophen 1,000 mg (02/05/21 0604)   methocarbamol (ROBAXIN) IV      LOS: 3 days   02/07/21, DO [facility] PAGER is on AMION  If 7PM-7AM, please contact night-coverage [email or web address]

## 2021-02-05 NOTE — Progress Notes (Signed)
Mobility Specialist Progress Note    02/05/21 1625  Mobility  Activity Ambulated in hall  Level of Assistance Modified independent, requires aide device or extra time  Assistive Device  (IV pole)  Distance Ambulated (ft) 1650 ft  Mobility Ambulated with assistance in hallway  Mobility Response Tolerated well  Mobility performed by Mobility specialist  $Mobility charge 1 Mobility   Pt received in bed and agreeable. Only c/o throat soreness and had some dizziness upon standing. Returned to bed with call bell in reach.   Dana Hall Mobility Specialist  Mobility Specialist Phone: [phone]

## 2021-02-05 NOTE — Evaluation (Signed)
Physical Therapy Evaluation Patient Details Name: Dana Hall MRN: [id number] DOB: January 12, 1941 Today's Date: 02/05/2021  History of Present Illness  [name] is a 80 y.o. 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  Clinical Impression  Patient is s/p above surgery resulting in functional limitations due to the deficits listed below (see PT Problem List). Comes from home where she lives with her husband in a multilevel house with a few steps to enter; Independent at baseline, no assistive device needed; Tends to be the driver in their home; Presents to PT with mildly decr gait steadiness and mildly decr activity tolerance; Anticipate good progress with mobiltiy, and she enjoys being up and walking;  Patient will benefit from skilled PT to increase their independence and safety with mobility to allow discharge to the venue listed below.       BP early in session standing: 142/66, HR 112 bpm BP standing post walk in hallwyas 144/72, HR 115 BP sitting, recliner upper body for comfort, feet down: 138/72, HR 84   Recommendations for follow up therapy are one component of a multi-disciplinary discharge planning process, led by the attending physician.  Recommendations may be updated based on patient status, additional functional criteria and insurance authorization.  Follow Up Recommendations Outpatient PT (Consider Oupt PT for gait and balance; if difficulty with transportation (pt seems to do all the driving), can consider HHPT)    Assistance Recommended at Discharge Set up Supervision/Assistance  Functional Status Assessment Patient has had a recent decline in their functional status and demonstrates the ability to make significant improvements in function in a reasonable and predictable amount of time.  Equipment Recommendations  None  recommended by PT (has 2 wheeled RW, and 4 wheeled RW is needed)    Recommendations for Other Services       Precautions / Restrictions Precautions Precautions: Fall Precaution Comments: Fall risk is present, but low;  and reduced further with use of RW; NG Tube Restrictions Weight Bearing Restrictions: No      Mobility  Bed Mobility Overal bed mobility: Needs Assistance Bed Mobility: Rolling;Sidelying to Sit Rolling: Min assist Sidelying to sit: Min guard       General bed mobility comments: Tended to try and use rails to pull forward and up, grimace noted,a nd pt confirmed incr pain with pulling straight up; close monitor and cues for rolling, and then pushing up sidelie to sit, sat up quickly    Transfers Overall transfer level: Needs assistance Equipment used: 1 person hand held assist Transfers: Sit to/from Stand Sit to Stand: Min assist;Min guard           General transfer comment: Cues for hand placement and to self-monitor for activity tolerance; stood from bed with noted incr sway at initial stand; stood from low commode with minfuard assist, used grabbar    Ambulation/Gait Ambulation/Gait assistance: Land (Feet): 250 Feet Assistive device: Rolling walker (2 wheels);1 person hand held assist Gait Pattern/deviations: Step-through pattern Gait velocity: slow   General Gait Details: Mild unsteadiness noted initially; Used RW for stabiltiy (adjusted height for better fit); Cues to self-monitor for activity tolerance; mid-way through walk, pt reported incr lightheadedness to 4/10 severity; lightheadedness eased down with more disatnce, no lightheadedness end of session  Stairs            Wheelchair Mobility  Modified Rankin (Stroke Patients Only)       Balance Overall balance assessment: Needs assistance   Sitting balance-Leahy Scale: Good       Standing balance-Leahy Scale: Fair                                Pertinent Vitals/Pain Pain Assessment: Faces Faces Pain Scale: Hurts little more Pain Location: lower abdomen, espeically in sitting position Pain Descriptors / Indicators: Cramping (feeling like lower abdomen is compressed) Pain Intervention(s): Monitored during session;Repositioned    Home Living Family/patient expects to be discharged to:: Private residence Living Arrangements: Spouse/significant other Available Help at Discharge: Family;Available 24 hours/day Type of Home: House Home Access: Stairs to enter Entrance Stairs-Rails: Left Entrance Stairs-Number of Steps: 2   Home Layout: Multi-level;Able to live on main level with bedroom/bathroom Home Equipment: Rolling Walker (2 wheels);Rollator (4 wheels) (would have to get out of attic)      Prior Function Prior Level of Function : Independent/Modified Independent             Mobility Comments: Enjoys "playing outside", working in her vegetable garden; retired Cabin crew ed Runner, broadcasting/film/video ADLs Comments: Reports no difficulty with ADLs     Hand Dominance        Extremity/Trunk Assessment   Upper Extremity Assessment Upper Extremity Assessment: Overall WFL for tasks assessed (for simple tasks, including hygeine after toileting)    Lower Extremity Assessment Lower Extremity Assessment: Generalized weakness (and mild unsteadiness)    Cervical / Trunk Assessment Cervical / Trunk Assessment: Other exceptions Cervical / Trunk Exceptions: Abdominal surgery  Communication   Communication: HOH  Cognition Arousal/Alertness: Awake/alert Behavior During Therapy: WFL for tasks assessed/performed Overall Cognitive Status: Within Functional Limits for tasks assessed                                 General Comments: Very conversational        General Comments General comments (skin integrity, edema, etc.):  BP early in session standing: 142/66, HR 112 bpm BP standing post walk in hallwyas 144/72, HR 115 BP  sitting, recliner upper body for comfort, feet down: 138/72, HR 84    Exercises     Assessment/Plan    PT Assessment Patient needs continued PT services  PT Problem List Decreased strength;Decreased activity tolerance;Decreased balance;Decreased mobility;Decreased knowledge of use of DME;Decreased safety awareness;Decreased knowledge of precautions;Pain       PT Treatment Interventions DME instruction;Gait training;Stair training;Functional mobility training;Therapeutic activities;Therapeutic exercise;Balance training;Neuromuscular re-education;Patient/family education    PT Goals (Current goals can be found in the Care Plan section)  Acute Rehab PT Goals Patient Stated Goal: Wants NG Tube out; hopes to be well enough to go home soon PT Goal Formulation: With patient Time For Goal Achievement: 02/19/21 Potential to Achieve Goals: Good    Frequency Min 3X/week   Barriers to discharge        Co-evaluation               AM-PAC PT "6 Clicks" Mobility  Outcome Measure Help needed turning from your back to your side while in a flat bed without using bedrails?: A Little Help needed moving from lying on your back to sitting on the side of a flat bed without using bedrails?: A Little Help needed moving to and from a bed to a chair (including a wheelchair)?: A Little Help  needed standing up from a chair using your arms (e.g., wheelchair or bedside chair)?: A Little Help needed to walk in hospital room?: A Little Help needed climbing 3-5 steps with a railing? : A Little 6 Click Score: 18    End of Session Equipment Utilized During Treatment: Gait belt (axillaeq) Activity Tolerance: Patient tolerated treatment well Patient left: in chair;with call bell/phone within reach;with chair alarm set Nurse Communication: Mobility status PT Visit Diagnosis: Unsteadiness on feet (R26.81);Pain;Dizziness and giddiness (R42) Pain - part of body:  (Lower abdomen)    Time: 3546-5681 PT  Time Calculation (min) (ACUTE ONLY): 39 min   Charges:   PT Evaluation $PT Eval Moderate Complexity: 1 Mod PT Treatments $Gait Training: 8-22 mins $Therapeutic Activity: 8-22 mins        Van Clines, PT  Acute Rehabilitation Services Pager [phone] Office [phone]   Levi Aland 02/05/2021, 12:20 PM

## 2021-02-06 LAB — COMPREHENSIVE METABOLIC PANEL
ALT: 13 U/L (ref 0–44)
AST: 18 U/L (ref 15–41)
Albumin: 2.5 g/dL — ABNORMAL LOW (ref 3.5–5.0)
Alkaline Phosphatase: 35 U/L — ABNORMAL LOW (ref 38–126)
Anion gap: 6 (ref 5–15)
BUN: 22 mg/dL (ref 8–23)
CO2: 27 mmol/L (ref 22–32)
Calcium: 8 mg/dL — ABNORMAL LOW (ref 8.9–10.3)
Chloride: 107 mmol/L (ref 98–111)
Creatinine, Ser: 0.56 mg/dL (ref 0.44–1.00)
GFR, Estimated: >60 mL/min (ref >60)
Glucose, Bld: 134 mg/dL — ABNORMAL HIGH (ref 70–99)
Potassium: 3.8 mmol/L (ref 3.5–5.1)
Sodium: 140 mmol/L (ref 135–145)
Total Bilirubin: 1 mg/dL (ref 0.3–1.2)
Total Protein: 5.2 g/dL — ABNORMAL LOW (ref 6.5–8.1)

## 2021-02-06 LAB — CBC WITH DIFFERENTIAL/PLATELET
Abs Immature Granulocytes: 0.04 10*3/uL (ref 0.00–0.07)
Basophils Absolute: 0 10*3/uL (ref 0.0–0.1)
Basophils Relative: 0 %
Eosinophils Absolute: 0 10*3/uL (ref 0.0–0.5)
Eosinophils Relative: 0 %
HCT: 38.3 % (ref 36.0–46.0)
Hemoglobin: 12.5 g/dL (ref 12.0–15.0)
Immature Granulocytes: 0 %
Lymphocytes Relative: 10 %
Lymphs Abs: 1 10*3/uL (ref 0.7–4.0)
MCH: 31.6 pg (ref 26.0–34.0)
MCHC: 32.6 g/dL (ref 30.0–36.0)
MCV: 97 fL (ref 80.0–100.0)
Monocytes Absolute: 1.1 10*3/uL — ABNORMAL HIGH (ref 0.1–1.0)
Monocytes Relative: 11 %
Neutro Abs: 7.8 10*3/uL — ABNORMAL HIGH (ref 1.7–7.7)
Neutrophils Relative %: 79 %
Platelets: 238 10*3/uL (ref 150–400)
RBC: 3.95 MIL/uL (ref 3.87–5.11)
RDW: 12.3 % (ref 11.5–15.5)
WBC: 9.9 10*3/uL (ref 4.0–10.5)
nRBC: 0 % (ref 0.0–0.2)

## 2021-02-06 LAB — MAGNESIUM: Magnesium: 2.1 mg/dL (ref 1.7–2.4)

## 2021-02-06 LAB — PHOSPHORUS: Phosphorus: 1.3 mg/dL — ABNORMAL LOW (ref 2.5–4.6)

## 2021-02-06 MED ORDER — METHOCARBAMOL 1000 MG/10ML IJ SOLN
500.0000 mg | Freq: Three times a day (TID) | INTRAVENOUS | Status: DC
  Administered 2021-02-06 – 2021-02-07 (×4): 500 mg via INTRAVENOUS
  Filled 2021-02-06 (×3): qty 5
  Filled 2021-02-06: qty 500
  Filled 2021-02-06 (×2): qty 5
  Filled 2021-02-06: qty 500

## 2021-02-06 MED ORDER — LIDOCAINE 5 % EX PTCH
1.0000 | MEDICATED_PATCH | CUTANEOUS | Status: DC
  Administered 2021-02-06 – 2021-02-10 (×5): 1 via TRANSDERMAL
  Filled 2021-02-06 (×8): qty 1

## 2021-02-06 NOTE — Progress Notes (Signed)
Physical Therapy Treatment Patient Details Name: Dana Hall MRN: [id number] DOB: September 29, 1940 Today's Date: 02/06/2021   History of Present Illness Dana Hall is a 80 y.o. 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    PT Comments    Continuing work on functional mobility and activity tolerance;  Session focused on progressive ambulation; Pt clearly in more pain today, and reports did not sleep well at all; Still, she is motivated to get better, and walked 2 laps around the 6N floor; Declined using RW (offered as a way to help decr pain with walking), but overall steady with IV push (R hand) - a few times of feet catching base of IV Pole; Will consider walking without her holding IV Pole next session to assess gait without assistive device  Recommendations for follow up therapy are one component of a multi-disciplinary discharge planning process, led by the attending physician.  Recommendations may be updated based on patient status, additional functional criteria and insurance authorization.  Follow Up Recommendations  Outpatient PT (Consider Oupt PT for gait and balance; if difficulty with transportation (pt seems to do all the driving), can consider HHPT)     Assistance Recommended at Discharge Set up Supervision/Assistance  Equipment Recommendations  None recommended by PT (has 2 wheeled RW, and 4 wheeled RW is needed)    Recommendations for Other Services       Precautions / Restrictions Precautions Precautions: Other (comment) Precaution Comments: NG Tube     Mobility  Bed Mobility Overal bed mobility: Needs Assistance Bed Mobility: Rolling;Sidelying to Sit Rolling: Min guard Sidelying to sit: Min guard       General bed mobility comments: Better roll technique with min cues; heavy use of rails     Transfers Overall transfer level: Needs assistance Equipment used: None (though held to bed rail) Transfers: Sit to/from Stand Sit to Stand: Min guard           General transfer comment: Slow rise due to pain; steadier than yesterday    Ambulation/Gait Ambulation/Gait assistance: Min guard (with physical contact) Gait Distance (Feet): 1100 Feet Assistive device: IV Pole Gait Pattern/deviations: Step-through pattern Gait velocity: slow   General Gait Details: No dizziness reported; incr pain L lower quadrant of abdomen, and decr gait speed compared to yesterday   Stairs             Wheelchair Mobility    Modified Rankin (Stroke Patients Only)       Balance     Sitting balance-Leahy Scale: Good       Standing balance-Leahy Scale: Fair                              Cognition Arousal/Alertness: Awake/alert Behavior During Therapy: WFL for tasks assessed/performed Overall Cognitive Status: Within Functional Limits for tasks assessed                                 General Comments: Far less talkative this session        Exercises      General Comments General comments (skin integrity, edema, etc.): Discussed benefits of staying OOB as much as possible during teh day -- to approximate a more normal day, and to keep effects of bedrest  at bay; Pt is concerned about more pain in seated position; I encouraged her to stay in recliner, including mentioning that she can be more reclined than yesterday; ultimately, pt opted to get back in bed      Pertinent Vitals/Pain Pain Assessment: Faces Faces Pain Scale: Hurts even more Pain Location: lower abdomen, espeically in sitting position Pain Descriptors / Indicators: Cramping (feeling like lower abdomen is compressed) Pain Intervention(s): Monitored during session    Home Living                          Prior Function            PT Goals (current goals can now be  found in the care plan section) Acute Rehab PT Goals Patient Stated Goal: Wants NG Tube out; hopes to be well enough to go home soon PT Goal Formulation: With patient Time For Goal Achievement: 02/19/21 Potential to Achieve Goals: Good Progress towards PT goals: Progressing toward goals    Frequency    Min 3X/week      PT Plan Current plan remains appropriate    Co-evaluation              AM-PAC PT "6 Clicks" Mobility   Outcome Measure  Help needed turning from your back to your side while in a flat bed without using bedrails?: A Little Help needed moving from lying on your back to sitting on the side of a flat bed without using bedrails?: A Little Help needed moving to and from a bed to a chair (including a wheelchair)?: A Little Help needed standing up from a chair using your arms (e.g., wheelchair or bedside chair)?: A Little Help needed to walk in hospital room?: A Little Help needed climbing 3-5 steps with a railing? : A Little 6 Click Score: 18    End of Session   Activity Tolerance: Patient tolerated treatment well Patient left: in bed;with call bell/phone within reach;with family/visitor present;Other (comment) (bed in semi-chair position) Nurse Communication: Mobility status PT Visit Diagnosis: Unsteadiness on feet (R26.81);Pain;Dizziness and giddiness (R42) Pain - part of body:  (Lower abdomen, L Lower quadrant)     Time: 5885-0277 PT Time Calculation (min) (ACUTE ONLY): 29 min  Charges:  $Gait Training: 23-37 mins                     Van Clines, [place]  Acute Rehabilitation Services Pager [phone] Office [phone]    Levi Aland 02/06/2021, 1:03 PM

## 2021-02-06 NOTE — Progress Notes (Signed)
Informed that gen surg will assume management of patient.  We will hence sign off  Appreciate the communication and collegiality  Pleas Koch, MD [facility] 11:55 AM

## 2021-02-06 NOTE — Progress Notes (Signed)
Mobility Specialist Progress Note   02/06/21 1600  Mobility  Activity Ambulated in hall  Level of Assistance Modified independent, requires aide device or extra time  Assistive Device  (IV Pole)  Distance Ambulated (ft) 801 ft  Mobility Ambulated with assistance in hallway  Mobility Response Tolerated well  Mobility performed by Mobility specialist  $Mobility charge 1 Mobility   Received pt laying in bed and agreeable to mobility. Mod I for transfer from supine to EOB but transition aggravated LLQ stating pain to be "horrible", Pt Insisted on continuing session. Gait is controlled and steady, x1 standing break d/t pain in abdomen. Returned back to bed w/ call bell by side and family members in the room. Finishing pain rated 8/10.     Frederico Hamman Mobility Specialist Phone Number [phone]

## 2021-02-06 NOTE — Progress Notes (Signed)
[facility] Progress Note  2 Days Post-Op  Subjective: CC:  Having pain with movement. Denies flatus or BM. Ongoing sore throat. Mobilized in hallway yesterday.  Objective: Vital signs in last 24 hours: Temp:  [98.4 F (36.9 C)-99.4 F (37.4 C)] 99.4 F (37.4 C) (10/26 0755) Pulse Rate:  [86-93] 86 (10/26 0755) Resp:  [16-20] 16 (10/26 0755) BP: (141-170)/(71-86) 160/78 (10/26 0755) SpO2:  [93 %-96 %] 95 % (10/26 0755) Last BM Date:  (Unknown)  Intake/Output from previous day: 10/25 0701 - 10/26 0700 In: 0  Out: 850 [Emesis/NG output:850] Intake/Output this shift: Total I/O In: 120 [P.O.:120] Out: 200 [Urine:200]  PE: Gen:  Alert, NAD, appears uncomfortable, pleasant and cooperative, appears younger than stated age. Card:  Regular rate and rhythm, pedal pulses 2+ BL Pulm:  Normal effort, clear to auscultation bilaterally Abd: Soft, moderately distended and tympanic, appropriately tender, incision w. Honeycomb dressing with small amt sanguinous strikethrough but no significant bleeding, hypoactive BS Skin: warm and dry, no rashes  Psych: A&Ox3   Lab Results:  Recent Labs    02/05/21 0118 02/06/21 0202  WBC 11.4* 9.9  HGB 14.7 12.5  HCT 45.0 38.3  PLT 301 238   BMET Recent Labs    02/05/21 0118 02/06/21 0202  NA 139 140  K 5.0 3.8  CL 105 107  CO2 29 27  GLUCOSE 116* 134*  BUN 26* 22  CREATININE 0.73 0.56  CALCIUM 8.3* 8.0*   PT/INR No results for input(s): LABPROT, INR in the last 72 hours. CMP     Component Value Date/Time   NA 140 02/06/2021 0202   K 3.8 02/06/2021 0202   CL 107 02/06/2021 0202   CO2 27 02/06/2021 0202   GLUCOSE 134 (H) 02/06/2021 0202   BUN 22 02/06/2021 0202   CREATININE 0.56 02/06/2021 0202   CREATININE 0.50 12/22/2011 1045   CALCIUM 8.0 (L) 02/06/2021 0202   PROT 5.2 (L) 02/06/2021 0202   ALBUMIN 2.5 (L) 02/06/2021 0202   AST 18 02/06/2021 0202   ALT 13 02/06/2021 0202   ALKPHOS 35 (L) 02/06/2021 0202    BILITOT 1.0 02/06/2021 0202   GFRNONAA >60 02/06/2021 0202   GFRAA >90 11/24/2011 0409   Lipase     Component Value Date/Time   LIPASE 48 02/02/2021 1148       Studies/Results: No results found.  Anti-infectives: Anti-infectives (From admission, onward)    None        Assessment/Plan  SBO, recurrent POD#2 s/p exploratory laparotomy lysis of adhesions 02/04/21 Dr.Connor - prior [facility] - open appendectomy, ex lap/SBR for prior SBO 2013 Dr. Derrell Lolling  - post-op ileus: continue NG tube to LIWS, await bowel function - mobilize - PRN analgesics - add Lidoderm patch and schedule robaxin today for increased pain control.   FEN: NPO, NGT to LIWS, ok to have limited ice chips and one popsicle for comfort ID: none VTE: SCD's, Lovenox  Foley: none Dispo: med-surg, await bowel function.  HTN    LOS: 4 days    Hosie Spangle, [facility] Surgery Please see Amion for pager number during day hours 7:00am-4:30pm

## 2021-02-07 MED ORDER — ACETAMINOPHEN 10 MG/ML IV SOLN
1000.0000 mg | Freq: Four times a day (QID) | INTRAVENOUS | Status: AC
  Administered 2021-02-07 – 2021-02-08 (×3): 1000 mg via INTRAVENOUS
  Filled 2021-02-07 (×3): qty 100

## 2021-02-07 MED ORDER — METHOCARBAMOL 1000 MG/10ML IJ SOLN: 750.0000 mg | Freq: Three times a day (TID) | INTRAVENOUS | Status: DC

## 2021-02-07 MED ORDER — FAMOTIDINE IN NACL 20-0.9 MG/50ML-% IV SOLN
20.0000 mg | INTRAVENOUS | Status: DC
  Administered 2021-02-07 – 2021-02-11 (×5): 20 mg via INTRAVENOUS
  Filled 2021-02-07 (×7): qty 50

## 2021-02-07 MED ORDER — KETOROLAC TROMETHAMINE 15 MG/ML IJ SOLN
15.0000 mg | Freq: Four times a day (QID) | INTRAMUSCULAR | Status: DC
  Administered 2021-02-07 – 2021-02-10 (×12): 15 mg via INTRAVENOUS
  Filled 2021-02-07 (×12): qty 1

## 2021-02-07 MED ORDER — DIPHENHYDRAMINE HCL 50 MG/ML IJ SOLN
12.5000 mg | Freq: Once | INTRAMUSCULAR | Status: AC
  Administered 2021-02-07: 12.5 mg via INTRAVENOUS
  Filled 2021-02-07: qty 1

## 2021-02-07 MED ORDER — METHOCARBAMOL 1000 MG/10ML IJ SOLN
500.0000 mg | Freq: Three times a day (TID) | INTRAVENOUS | Status: DC
  Administered 2021-02-07 – 2021-02-13 (×18): 500 mg via INTRAVENOUS
  Filled 2021-02-07: qty 500
  Filled 2021-02-07: qty 5
  Filled 2021-02-07 (×2): qty 500
  Filled 2021-02-07 (×2): qty 5
  Filled 2021-02-07: qty 500
  Filled 2021-02-07: qty 5
  Filled 2021-02-07: qty 500
  Filled 2021-02-07: qty 5
  Filled 2021-02-07 (×2): qty 500
  Filled 2021-02-07 (×2): qty 5
  Filled 2021-02-07 (×2): qty 500
  Filled 2021-02-07: qty 5
  Filled 2021-02-07 (×2): qty 500
  Filled 2021-02-07: qty 5
  Filled 2021-02-07: qty 500

## 2021-02-07 MED ORDER — BISACODYL 10 MG RE SUPP
10.0000 mg | Freq: Once | RECTAL | Status: AC
  Administered 2021-02-07: 10 mg via RECTAL
  Filled 2021-02-07: qty 1

## 2021-02-07 NOTE — Plan of Care (Signed)

## 2021-02-07 NOTE — Progress Notes (Signed)
[facility] Progress Note  3 Days Post-Op  Subjective: CC:  Reports depressed mood. Having pain with movement. Denies flatus or BM. States she did not walk as much yesterday as they before due to pain and motivation.  Objective: Vital signs in last 24 hours: Temp:  [98.3 F (36.8 C)-98.9 F (37.2 C)] 98.4 F (36.9 C) (10/27 0834) Pulse Rate:  [76-88] 76 (10/27 0834) Resp:  [17-19] 19 (10/27 0736) BP: (139-163)/(71-80) 139/71 (10/27 0834) SpO2:  [91 %-94 %] 93 % (10/27 0834) Last BM Date: 02/02/21  Intake/Output from previous day: 10/26 0701 - 10/27 0700 In: 2726.2 [P.O.:120; I.V.:2456.2; IV Piggyback:150] Out: 650 [Urine:650] Intake/Output this shift: Total I/O In: -  Out: 800 [Emesis/NG output:800]  PE: Gen:  Alert, NAD, appears uncomfortable, pleasant and cooperative, appears younger than stated age. Card:  Regular rate and rhythm, pedal pulses 2+ BL Pulm:  Normal effort, clear to auscultation bilaterally Abd: Soft, moderately distended, appropriately tender, incision w. Honeycomb dressing with small amt sanguinous strikethrough but no significant bleeding, hypoactive BS; NG with dark brown cloudy drainage. Skin: warm and dry, no rashes  Psych: A&Ox3   Lab Results:  Recent Labs    02/05/21 0118 02/06/21 0202  WBC 11.4* 9.9  HGB 14.7 12.5  HCT 45.0 38.3  PLT 301 238   BMET Recent Labs    02/05/21 0118 02/06/21 0202  NA 139 140  K 5.0 3.8  CL 105 107  CO2 29 27  GLUCOSE 116* 134*  BUN 26* 22  CREATININE 0.73 0.56  CALCIUM 8.3* 8.0*   PT/INR No results for input(s): LABPROT, INR in the last 72 hours. CMP     Component Value Date/Time   NA 140 02/06/2021 0202   K 3.8 02/06/2021 0202   CL 107 02/06/2021 0202   CO2 27 02/06/2021 0202   GLUCOSE 134 (H) 02/06/2021 0202   BUN 22 02/06/2021 0202   CREATININE 0.56 02/06/2021 0202   CREATININE 0.50 12/22/2011 1045   CALCIUM 8.0 (L) 02/06/2021 0202   PROT 5.2 (L) 02/06/2021 0202   ALBUMIN  2.5 (L) 02/06/2021 0202   AST 18 02/06/2021 0202   ALT 13 02/06/2021 0202   ALKPHOS 35 (L) 02/06/2021 0202   BILITOT 1.0 02/06/2021 0202   GFRNONAA >60 02/06/2021 0202   GFRAA >90 11/24/2011 0409   Lipase     Component Value Date/Time   LIPASE 48 02/02/2021 1148       Studies/Results: No results found.  Anti-infectives: Anti-infectives (From admission, onward)    None        Assessment/Plan  SBO, recurrent POD#3 s/p exploratory laparotomy lysis of adhesions 02/04/21 Dr.Connor - prior [facility] - open appendectomy, ex lap/SBR for prior SBO 2013 Dr. Derrell Lolling  - post-op ileus: continue NG tube to LIWS, await bowel function; give dulcolax suppository today - mobilize - PRN analgesics - continue IV tylenol, robaxin, and lidoderm patch. Add IV toradol 15 mg q6h  FEN: NPO, NGT to LIWS, ok to have limited ice chips and one popsicle for comfort ID: none VTE: SCD's, Lovenox  Foley: none Dispo: med-surg, await bowel function.  HTN    LOS: 5 days    Hosie Spangle, [facility] Surgery Please see Amion for pager number during day hours 7:00am-4:30pm

## 2021-02-07 NOTE — Progress Notes (Signed)
Mobility Specialist Progress Note   02/07/21 1200  Mobility  Activity Ambulated in hall  Level of Assistance Modified independent, requires aide device or extra time  Assistive Device  (IV Pole)  Distance Ambulated (ft) 1325 ft  Mobility Ambulated independently in hallway  Mobility Response Tolerated well  Mobility performed by Mobility specialist  $Mobility charge 1 Mobility   Received pt in bed c/o having a sore throat and constant LLQ pain but agreeable to mobility. Pt states to be ambulating better than yesterday afternoon, pain wise. Returned back to room to use BR, void successful. Then back to bed w/ call bell by side, all needs met and family in the room.  Holland Falling Mobility Specialist Phone Number [phone]

## 2021-02-07 NOTE — Progress Notes (Signed)
Physical Therapy Treatment Patient Details Name: Dana Hall MRN: [id number] DOB: 04-May-1940 Today's Date: 02/07/2021   History of Present Illness Dana Hall is a 80 y.o. 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    PT Comments    Continuing work on functional mobility and activity tolerance;  Session focused on progressive amb, this time without holding IV pole, to more approximate normal walking at home; Overall tolerating very well, and less painful today than yesterday;   Making excellent progress towards goals -- will likely meet goals and dc PT next session, with Mobility to follow pt.  Recommendations for follow up therapy are one component of a multi-disciplinary discharge planning process, led by the attending physician.  Recommendations may be updated based on patient status, additional functional criteria and insurance authorization.  Follow Up Recommendations  Outpatient PT (Consider Oupt PT for gait and balance; if difficulty with transportation (pt seems to do all the driving), can consider HHPT)     Assistance Recommended at Discharge Set up Supervision/Assistance  Equipment Recommendations  None recommended by PT    Recommendations for Other Services       Precautions / Restrictions Precautions Precaution Comments: NG Tube     Mobility  Bed Mobility Overal bed mobility: Needs Assistance Bed Mobility: Rolling;Sidelying to Sit Rolling: Min guard Sidelying to sit: Min guard       General bed mobility comments: Got up on L side of bed; cues for technique adn hand placement, pt unsure where to put her hands    Transfers Overall transfer level: Needs assistance Equipment used: None Transfers: Sit to/from Stand Sit to Stand: Min guard           General transfer comment: smoother rise     Ambulation/Gait Ambulation/Gait assistance: Min guard (without phsyical contact) Gait Distance (Feet): 1300 Feet Assistive device: None Gait Pattern/deviations: Step-through pattern     General Gait Details: walked without UE support from IV pole with success   Stairs             Wheelchair Mobility    Modified Rankin (Stroke Patients Only)       Balance     Sitting balance-Leahy Scale: Good       Standing balance-Leahy Scale: Good                              Cognition Arousal/Alertness: Awake/alert Behavior During Therapy: WFL for tasks assessed/performed Overall Cognitive Status: Within Functional Limits for tasks assessed                                          Exercises      General Comments General comments (skin integrity, edema, etc.): Discussed options for postitioning where she could still be OOB, but not have teh pain that she reports experiencing in the chair; will try porpping on pillows on the window couch next session      Pertinent Vitals/Pain Pain Assessment: Faces Faces Pain Scale: Hurts little more Pain Location: lower abdomen, espeically in sitting position Pain Descriptors / Indicators: Cramping (feeling like lower abdomen is compressed) Pain Intervention(s): Monitored during session    Home Living  Prior Function            PT Goals (current goals can now be found in the care plan section) Acute Rehab PT Goals Patient Stated Goal: Wants NG Tube out; hopes to be well enough to go home soon PT Goal Formulation: With patient Time For Goal Achievement: 02/19/21 Potential to Achieve Goals: Good Progress towards PT goals: Progressing toward goals (will likely meet PT goals next session)    Frequency    Min 3X/week      PT Plan Current plan remains appropriate    Co-evaluation              AM-PAC PT "6 Clicks" Mobility   Outcome Measure  Help  needed turning from your back to your side while in a flat bed without using bedrails?: A Little Help needed moving from lying on your back to sitting on the side of a flat bed without using bedrails?: A Little Help needed moving to and from a bed to a chair (including a wheelchair)?: A Little Help needed standing up from a chair using your arms (e.g., wheelchair or bedside chair)?: A Little Help needed to walk in hospital room?: A Little Help needed climbing 3-5 steps with a railing? : A Little 6 Click Score: 18    End of Session Equipment Utilized During Treatment: Gait belt (axillaeq) Activity Tolerance: Patient tolerated treatment well Patient left: in bed;with call bell/phone within reach Nurse Communication: Mobility status PT Visit Diagnosis: Unsteadiness on feet (R26.81);Pain;Dizziness and giddiness (R42) Pain - part of body:  (Lower abdomen, L Lower quadrant)     Time: 6440-3474 PT Time Calculation (min) (ACUTE ONLY): 30 min  Charges:  $Gait Training: 23-37 mins                     Van Clines, [place]  Acute Rehabilitation Services Pager [phone] Office [phone]    Levi Aland 02/07/2021, 6:15 PM

## 2021-02-08 ENCOUNTER — Inpatient Hospital Stay: Payer: Self-pay

## 2021-02-08 LAB — PHOSPHORUS: Phosphorus: 1.6 mg/dL — ABNORMAL LOW (ref 2.5–4.6)

## 2021-02-08 LAB — BASIC METABOLIC PANEL
Anion gap: 4 — ABNORMAL LOW (ref 5–15)
BUN: 11 mg/dL (ref 8–23)
CO2: 26 mmol/L (ref 22–32)
Calcium: 7.4 mg/dL — ABNORMAL LOW (ref 8.9–10.3)
Chloride: 107 mmol/L (ref 98–111)
Creatinine, Ser: 0.44 mg/dL (ref 0.44–1.00)
GFR, Estimated: >60 mL/min (ref >60)
Glucose, Bld: 102 mg/dL — ABNORMAL HIGH (ref 70–99)
Potassium: 2.8 mmol/L — ABNORMAL LOW (ref 3.5–5.1)
Sodium: 137 mmol/L (ref 135–145)

## 2021-02-08 LAB — MAGNESIUM: Magnesium: 2.1 mg/dL (ref 1.7–2.4)

## 2021-02-08 LAB — GLUCOSE, CAPILLARY: Glucose-Capillary: 136 mg/dL — ABNORMAL HIGH (ref 70–99)

## 2021-02-08 MED ORDER — SODIUM CHLORIDE 0.9% FLUSH
10.0000 mL | Freq: Two times a day (BID) | INTRAVENOUS | Status: DC
  Administered 2021-02-08 – 2021-02-09 (×2): 10 mL
  Administered 2021-02-10: 30 mL
  Administered 2021-02-10 – 2021-02-11 (×2): 10 mL
  Administered 2021-02-11: 30 mL
  Administered 2021-02-12 – 2021-02-13 (×3): 10 mL

## 2021-02-08 MED ORDER — POTASSIUM CHLORIDE 10 MEQ/100ML IV SOLN
10.0000 meq | INTRAVENOUS | Status: DC
  Administered 2021-02-08: 10 meq via INTRAVENOUS
  Filled 2021-02-08: qty 100

## 2021-02-08 MED ORDER — INSULIN ASPART 100 UNIT/ML IJ SOLN
0.0000 [IU] | Freq: Four times a day (QID) | INTRAMUSCULAR | Status: DC
  Administered 2021-02-08: 1 [IU] via SUBCUTANEOUS
  Administered 2021-02-09: 2 [IU] via SUBCUTANEOUS
  Administered 2021-02-09: 1 [IU] via SUBCUTANEOUS
  Administered 2021-02-09 (×2): 2 [IU] via SUBCUTANEOUS
  Administered 2021-02-10 (×2): 1 [IU] via SUBCUTANEOUS

## 2021-02-08 MED ORDER — POTASSIUM CHLORIDE 10 MEQ/100ML IV SOLN
10.0000 meq | INTRAVENOUS | Status: AC
  Administered 2021-02-08 (×3): 10 meq via INTRAVENOUS
  Filled 2021-02-08 (×2): qty 100

## 2021-02-08 MED ORDER — POTASSIUM PHOSPHATES 15 MMOLE/5ML IV SOLN
30.0000 mmol | Freq: Once | INTRAVENOUS | Status: AC
  Administered 2021-02-08: 30 mmol via INTRAVENOUS
  Filled 2021-02-08: qty 10

## 2021-02-08 MED ORDER — SODIUM CHLORIDE 0.9% FLUSH
10.0000 mL | INTRAVENOUS | Status: DC | PRN
  Administered 2021-02-13: 10 mL

## 2021-02-08 MED ORDER — CHLORHEXIDINE GLUCONATE CLOTH 2 % EX PADS
6.0000 | MEDICATED_PAD | Freq: Every day | CUTANEOUS | Status: DC
  Administered 2021-02-08 – 2021-02-13 (×6): 6 via TOPICAL

## 2021-02-08 MED ORDER — TRAVASOL 10 % IV SOLN
INTRAVENOUS | Status: AC
  Filled 2021-02-08: qty 633.6

## 2021-02-08 MED ORDER — BISACODYL 10 MG RE SUPP
10.0000 mg | Freq: Once | RECTAL | Status: AC
  Administered 2021-02-08: 10 mg via RECTAL
  Filled 2021-02-08: qty 1

## 2021-02-08 MED ORDER — DIPHENHYDRAMINE HCL 50 MG/ML IJ SOLN
12.5000 mg | Freq: Four times a day (QID) | INTRAMUSCULAR | Status: DC | PRN
  Administered 2021-02-09: 12.5 mg via INTRAVENOUS
  Filled 2021-02-08 (×3): qty 1

## 2021-02-08 MED ORDER — SODIUM CHLORIDE 0.9 % IV SOLN: INTRAVENOUS | Status: AC

## 2021-02-08 NOTE — Progress Notes (Signed)
Mobility Specialist Progress Note   02/08/21 1100  Mobility  Activity Refused mobility   NT had just walked pt 20 mins prior to me walking in. Pt requested for me to come back in the afternoon so that they could "regain their wind".   Frederico Hamman Mobility Specialist Phone Number [phone]

## 2021-02-08 NOTE — Plan of Care (Signed)

## 2021-02-08 NOTE — Progress Notes (Signed)
PHARMACY - TOTAL PARENTERAL NUTRITION CONSULT NOTE   Indication: Small bowel obstruction and post-op ileus   Patient Measurements: Height: '5\' 8"'  (172.7 cm) Weight: 58 kg (127 lb 13.9 oz) IBW/kg (Calculated) : 63.9 TPN AdjBW (KG): 58 Body mass index is 19.44 kg/m. Usual Weight: 58 kg  Assessment:  80 yo W with hx of SBO and ex lap in 2013 now with SBO again failing NGT decompression, gastrografin, s/p ex lap lysis of adhesions 02/04/21 now with post-op ileus. Patient with poor or no PO intake since 01/31/21. Per patient, weight at home is 122 lbs.  Weights in Epic are 127-129 lbs but patient said she was wearing heavy shoes and clothes when weighed here. Patient had diarrhea for 2 months after Christmas 2021 with limited diet (soup, rice, soft foods) and lost weight then. She has been eating more normally (lots of fruits and vegetables, chicken, fish, not much other meat) since the diarrhea resolved but still has not been able to gain much weight. Patient is at risk for refeeding given electrolyte drop on 90g of dextrose daily via IV fluids x3 days before TPN consult.   Glucose / Insulin: BG 102-134 while NPO on D5'@75ml' /hr since 10/25, A1C 5.3 no diabetes Electrolytes: K 2.8 receiving IV K 50 mEq + 45 mEq in K Phos; Phos 1.6 > 30 mmol Kphos, Mg 2.1; CoCa 8.6 others wnl Renal: Scr 0.44, BUN wnl  Hepatic: alk phos low, others wnl, albumin 2.5 Intake / Output; MIVF: D5NS @ 28m/hr, UOP not completely documented, NGT 8552mout GI Imaging: 10/22 CT abd: high grade distal SBO  10/23 Abd XR: persistent dilation of SB loops 10/23 Abd XR: post gastrografin: contrast with moderate progression through dilated SB 10/24 Abd XR: concerning for ileus or distal SBO GI Surgeries / Procedures:  10/24 Ex lap, LOA  Central access: PICC ordered 02/08/21 TPN start date: 02/08/21   Nutritional Goals: Goal TPN rate is 75 mL/hr (provides 95 g of protein, 261g dextrose, 56g lipids and 1825 kcals per day)  RD  Assessment: pending    Current Nutrition:  NPO  Plan:  Start TPN at 50 mL/hr at 1800 (Provides provides 63 g of protein, 174 g dextrose, 37 g lipids and 1217 kcals per day, meeting 67% of estimated needs) Electrolytes in TPN: Na 150 mEq/L, K 50 mEq/L, Ca 8 mEq/L, Mg 8 mEq/L, and Phos 18 mmol/L. Cl:Ac 1:1 Add standard MVI and trace elements to TPN Decrease IV KCl from 60 to 40 mEq Give Kphos 30 mmol (provides 45 mEq of K)  Initiate Sensitive q6h SSI at 2000 and adjust as needed  Add thiamine x7 days (last day 11/3) to decrease refeeding risk  Stop D5NS at 1759 and start NS at 25 mL/hr at 1800 Monitor TPN labs daily until stable at goal then on Mon/Thurs  LyBenetta SparPharmD, BCPS, BCHockingharmacist  Please check AMION for all [facility] numbers After 10:00 PM, call [place]

## 2021-02-08 NOTE — Progress Notes (Signed)
[facility] Progress Note  4 Days Post-Op  Subjective: CC:  Some flatus that started yesterday evening, she attributes this to the suppository. Pain with movement remains but patent feel she is moving around a little easier today. Walked yesterday. Daughter at bedside reports patient seemed less lethargic yesterday.   Objective: Vital signs in last 24 hours: Temp:  [98 F (36.7 C)-99.2 F (37.3 C)] 98.5 F (36.9 C) (10/28 0721) Pulse Rate:  [69-74] 69 (10/28 0721) Resp:  [16-20] 19 (10/28 0721) BP: (132-154)/(60-70) 154/70 (10/28 0721) SpO2:  [87 %-93 %] 92 % (10/28 0721) Last BM Date: 02/02/21  Intake/Output from previous day: 10/27 0701 - 10/28 0700 In: 2230 [I.V.:1780; IV Piggyback:450] Out: 1150 [Urine:300; Emesis/NG output:850] Intake/Output this shift: Total I/O In: -  Out: 400 [Emesis/NG output:400]  PE: Gen:  Alert, NAD, appears uncomfortable, pleasant and cooperative, appears younger than stated age. Card:  Regular rate and rhythm, pedal pulses 2+ BL Pulm:  Normal effort, clear to auscultation bilaterally Abd: Soft, moderately distended but improved compared to previous exam, +BS high pitched, appropriately tender, midline incision c/d/i Skin: warm and dry, no rashes  Psych: A&Ox3   Lab Results:  Recent Labs    02/06/21 0202  WBC 9.9  HGB 12.5  HCT 38.3  PLT 238   BMET Recent Labs    02/06/21 0202 02/08/21 0217  NA 140 137  K 3.8 2.8*  CL 107 107  CO2 27 26  GLUCOSE 134* 102*  BUN 22 11  CREATININE 0.56 0.44  CALCIUM 8.0* 7.4*   PT/INR No results for input(s): LABPROT, INR in the last 72 hours. CMP     Component Value Date/Time   NA 137 02/08/2021 0217   K 2.8 (L) 02/08/2021 0217   CL 107 02/08/2021 0217   CO2 26 02/08/2021 0217   GLUCOSE 102 (H) 02/08/2021 0217   BUN 11 02/08/2021 0217   CREATININE 0.44 02/08/2021 0217   CREATININE 0.50 12/22/2011 1045   CALCIUM 7.4 (L) 02/08/2021 0217   PROT 5.2 (L) 02/06/2021 0202    ALBUMIN 2.5 (L) 02/06/2021 0202   AST 18 02/06/2021 0202   ALT 13 02/06/2021 0202   ALKPHOS 35 (L) 02/06/2021 0202   BILITOT 1.0 02/06/2021 0202   GFRNONAA >60 02/08/2021 0217   GFRAA >90 11/24/2011 0409   Lipase     Component Value Date/Time   LIPASE 48 02/02/2021 1148       Studies/Results: [place] EKG SITE RITE  Result Date: 02/08/2021 If Site Rite image not attached, placement could not be confirmed due to current cardiac rhythm.   Anti-infectives: Anti-infectives (From admission, onward)    None        Assessment/Plan  SBO, recurrent POD#4 s/p exploratory laparotomy lysis of adhesions 02/04/21 Dr.Connor - prior [facility] - open appendectomy, ex lap/SBR for prior SBO 2013 Dr. Derrell Lolling  - post-op ileus: continue NG tube to LIWS, await bowel function; give dulcolax suppository again today - mobilize - PRN analgesics - continue IV tylenol, IV toradol, robaxin, and lidoderm patch.  - add IV benadryl PRN for poor sleep   FEN: NPO, NGT to LIWS, ok to have limited ice chips and one popsicle for comfort; hypokalemia 2.8 - replete, recheck in AM ID: none VTE: SCD's, Lovenox  Foley: none Dispo: med-surg, await bowel function.  HTN    LOS: 6 days    Hosie Spangle, [facility] Surgery Please see Amion for pager number during day hours 7:00am-4:30pm

## 2021-02-08 NOTE — Progress Notes (Signed)
Initial Nutrition Assessment  DOCUMENTATION CODES:   Not applicable  INTERVENTION:   -TPN management per pharmacy -RD will follow for diet advancement and add supplements as appropriate  NUTRITION DIAGNOSIS:   Inadequate oral intake related to altered GI function as evidenced by NPO status.  GOAL:   Patient will meet greater than or equal to 90% of their needs  MONITOR:   Diet advancement, Labs, Weight trends, Skin, I & O's  REASON FOR ASSESSMENT:   Consult New TPN/TNA  ASSESSMENT:   Dana Hall is a 80 y.o. female with medical history significant of Sbo status post partial small bowel resection, HTN, came with new onset abdominal pain.  Pt admitted with SBO.   10/24- s/p Exploratory laparotomy, lysis of adhesions x80 minutes 10/28- PICC placed  Reviewed I/O's: +1.1 L x 24 hours and +2.6 L since admission  UOP: 300 ml x 24 hours  NGT output: 850 ml x 24 hours  Pt unavailable at time of visit. Attempted to speak with pt via call to hospital room phone, however, unable to reach. RD unable to obtain further nutrition-related history or complete nutrition-focused physical exam at this time.    Pt has received inadequate nutrition (NPO) for the past 6 days.   Reviewed wt hx; pt wt has been stable over the past 6 months.   Per pharmacy note, plan to start TPN at 50 ml/hr, which provides 1217 kcals and 63 grams protein, meeting 70% of estimated needs and  74% of estimated protein needs.  Medications reviewed and include dextrose 5%-0.9% sodium chloride infusion @ 75 ml/hr.   Labs reviewed: K: 3.2. Mg: 1.6. Inpatient orders for glycemic control are 0-9 units insulin aspart every 4 hours.   Diet Order:   Diet Order             Diet NPO time specified Except for: Ice Chips, Other (See Comments)  Diet effective now                   EDUCATION NEEDS:   No education needs have been identified at this time  Skin:  Skin Assessment: Skin Integrity  Issues: Skin Integrity Issues:: Incisions Incisions: closed abdomen  Last BM:  02/02/21  Height:   Ht Readings from Last 1 Encounters:  02/02/21 5\' 8"  (1.727 m)    Weight:   Wt Readings from Last 1 Encounters:  02/02/21 58 kg    Ideal Body Weight:  63.6 kg  BMI:  Body mass index is 19.44 kg/m.  Estimated Nutritional Needs:   Kcal:  1750-1950  Protein:  85-100 grams  Fluid:  > 1.7 L    02/04/21, RD, LDN, CDCES Registered Dietitian II Certified Diabetes Care and Education Specialist Please refer to [facility] for RD and/or RD on-call/weekend/after hours pager

## 2021-02-08 NOTE — Progress Notes (Signed)
Peripherally Inserted Central Catheter Placement  The IV Nurse has discussed with the patient and/or persons authorized to consent for the patient, the purpose of this procedure and the potential benefits and risks involved with this procedure.  The benefits include less needle sticks, lab draws from the catheter, and the patient may be discharged home with the catheter. Risks include, but not limited to, infection, bleeding, blood clot (thrombus formation), and puncture of an artery; nerve damage and irregular heartbeat and possibility to perform a PICC exchange if needed/ordered by physician.  Alternatives to this procedure were also discussed.  Bard Power PICC patient education guide, fact sheet on infection prevention and patient information card has been provided to patient /or left at bedside.    PICC Placement Documentation  PICC Double Lumen 02/08/21 PICC Right Basilic 37 cm 0 cm (Active)  Indication for Insertion or Continuance of Line Administration of hyperosmolar/irritating solutions (i.e. TPN, Vancomycin, etc.) 02/08/21 1200  Exposed Catheter (cm) 0 cm 02/08/21 1200  Site Assessment Clean;Dry;Intact 02/08/21 1200  Lumen #1 Status Flushed;Blood return noted 02/08/21 1200  Lumen #2 Status Flushed;Blood return noted 02/08/21 1200  Dressing Type Transparent 02/08/21 1200  Dressing Status Clean;Dry;Intact 02/08/21 1200  Antimicrobial disc in place? Yes 02/08/21 1200  Dressing Change Due 02/15/21 02/08/21 1200       Stacie Glaze Horton 02/08/2021, 12:32 PM

## 2021-02-08 NOTE — Progress Notes (Addendum)
Physical Therapy Treatment and Discharge Patient Details Name: Dana Hall MRN: [id number] DOB: 1941/01/19 Today's Date: 02/08/2021   History of Present Illness Dana Hall is a 80 y.o. 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    PT Comments    Session focused on continuing ambulation, and  trying to find more options for sitting resting comfortably outside of the bed (with no very good result -- the bed is still most comfortable for pt); Walking well, managing in room independently; Acute PT goals met; will sign off and be happy to reconsult should any need arise;   Mobility Team to follow for continuing progressive amb  Recommendations for follow up therapy are one component of a multi-disciplinary discharge planning process, led by the attending physician.  Recommendations may be updated based on patient status, additional functional criteria and insurance authorization.  Follow Up Recommendations  Outpatient PT (Consider Oupt PT for gait and balance; if difficulty with transportation (pt seems to do all the driving), can consider HHPT)     Assistance Recommended at Discharge Set up [place] Recommendations  None recommended by PT    Recommendations for Other Services       Precautions / Restrictions Precautions Precaution Comments: NG Tube     Mobility  Bed Mobility Overal bed mobility: Needs Assistance Bed Mobility: Rolling;Sidelying to Sit Rolling: Min guard Sidelying to sit: Min guard       General bed mobility comments: Got up on L side of bed; cues for technique adn hand placement    Transfers Overall transfer level: Needs assistance Equipment used: None Transfers: Sit to/from Stand Sit to Stand: Supervision           General transfer comment: smoother rise     Ambulation/Gait Ambulation/Gait assistance: Supervision Gait Distance (Feet): 550 Feet Assistive device: None Gait Pattern/deviations: WFL(Within Functional Limits)   Gait velocity interpretation: >2.62 ft/sec, indicative of community ambulatory     Stairs         General stair comments: We discussed stairs to enter her home; has 2 steps to enter, and pt and family express confidence that they will not be difficult to manage; PT in agreement   Wheelchair Mobility    Modified Rankin (Stroke Patients Only)       Balance     Sitting balance-Leahy Scale: Good       Standing balance-Leahy Scale: Good                              Cognition Arousal/Alertness: Awake/alert Behavior During Therapy: WFL for tasks assessed/performed Overall Cognitive Status: Within Functional Limits for tasks assessed                                          Exercises      General Comments General comments (skin integrity, edema, etc.): Took time to try pillows and support to make sitting OOB more comfortable for pt, with equivocal results; likely pt will choose to be in bed rather tahn recliner or couch by window; pt is familiar with effects of bedrest, and promised PT that she will continue walks in the hallways multiple times daily      Pertinent Vitals/Pain  Pain Assessment: Faces Faces Pain Scale: Hurts little more Pain Descriptors / Indicators: Cramping (feeling like lower abdomen is compressed) Pain Intervention(s): Monitored during session    Home Living                          Prior Function            PT Goals (current goals can now be found in the care plan section) Acute Rehab PT Goals Patient Stated Goal: Wants NG Tube out; hopes to be well enough to go home soon Progress towards PT goals: Goals met/education completed, patient discharged from PT    Frequency    Min 3X/week      PT Plan Current plan remains  appropriate    Co-evaluation              AM-PAC PT "6 Clicks" Mobility   Outcome Measure  Help needed turning from your back to your side while in a flat bed without using bedrails?: A Little Help needed moving from lying on your back to sitting on the side of a flat bed without using bedrails?: A Little Help needed moving to and from a bed to a chair (including a wheelchair)?: A Little Help needed standing up from a chair using your arms (e.g., wheelchair or bedside chair)?: None Help needed to walk in hospital room?: None Help needed climbing 3-5 steps with a railing? : None 6 Click Score: 21    End of Session   Activity Tolerance: Patient tolerated treatment well Patient left: Other (comment);with family/visitor present (Managing independently in room) Nurse Communication: Mobility status PT Visit Diagnosis: Unsteadiness on feet (R26.81);Pain;Dizziness and giddiness (R42) Pain - part of body:  (Lower abdomen, L Lower quadrant)     Time: 9678-9381 PT Time Calculation (min) (ACUTE ONLY): 31 min  Charges:  $Gait Training: 8-22 mins $Therapeutic Activity: 8-22 mins                     Roney Marion, PT  Acute Rehabilitation Services Pager [phone] Office [place] 02/08/2021, 4:06 PM

## 2021-02-09 ENCOUNTER — Inpatient Hospital Stay (HOSPITAL_COMMUNITY): Payer: Medicare PPO

## 2021-02-09 DIAGNOSIS — J90 Pleural effusion, not elsewhere classified: Secondary | ICD-10-CM

## 2021-02-09 DIAGNOSIS — K56609 Unspecified intestinal obstruction, unspecified as to partial versus complete obstruction: Secondary | ICD-10-CM | POA: Diagnosis not present

## 2021-02-09 DIAGNOSIS — E876 Hypokalemia: Secondary | ICD-10-CM | POA: Diagnosis not present

## 2021-02-09 DIAGNOSIS — I2699 Other pulmonary embolism without acute cor pulmonale: Secondary | ICD-10-CM | POA: Diagnosis not present

## 2021-02-09 LAB — TRIGLYCERIDES: Triglycerides: 106 mg/dL (ref <150)

## 2021-02-09 LAB — POTASSIUM: Potassium: 3.7 mmol/L (ref 3.5–5.1)

## 2021-02-09 LAB — HEPARIN LEVEL (UNFRACTIONATED): Heparin Unfractionated: 0.12 IU/mL — ABNORMAL LOW (ref 0.30–0.70)

## 2021-02-09 LAB — PHOSPHORUS: Phosphorus: 2.6 mg/dL (ref 2.5–4.6)

## 2021-02-09 LAB — GLUCOSE, CAPILLARY
Glucose-Capillary: 145 mg/dL — ABNORMAL HIGH (ref 70–99)
Glucose-Capillary: 149 mg/dL — ABNORMAL HIGH (ref 70–99)
Glucose-Capillary: 152 mg/dL — ABNORMAL HIGH (ref 70–99)
Glucose-Capillary: 153 mg/dL — ABNORMAL HIGH (ref 70–99)
Glucose-Capillary: 165 mg/dL — ABNORMAL HIGH (ref 70–99)

## 2021-02-09 LAB — COMPREHENSIVE METABOLIC PANEL
ALT: 33 U/L (ref 0–44)
AST: 46 U/L — ABNORMAL HIGH (ref 15–41)
Albumin: 2.7 g/dL — ABNORMAL LOW (ref 3.5–5.0)
Alkaline Phosphatase: 57 U/L (ref 38–126)
Anion gap: 11 (ref 5–15)
BUN: 8 mg/dL (ref 8–23)
CO2: 26 mmol/L (ref 22–32)
Calcium: 8.3 mg/dL — ABNORMAL LOW (ref 8.9–10.3)
Chloride: 99 mmol/L (ref 98–111)
Creatinine, Ser: 0.42 mg/dL — ABNORMAL LOW (ref 0.44–1.00)
GFR, Estimated: >60 mL/min (ref >60)
Glucose, Bld: 134 mg/dL — ABNORMAL HIGH (ref 70–99)
Potassium: 3.3 mmol/L — ABNORMAL LOW (ref 3.5–5.1)
Sodium: 136 mmol/L (ref 135–145)
Total Bilirubin: 1.1 mg/dL (ref 0.3–1.2)
Total Protein: 6 g/dL — ABNORMAL LOW (ref 6.5–8.1)

## 2021-02-09 LAB — BRAIN NATRIURETIC PEPTIDE: B Natriuretic Peptide: 3335.5 pg/mL — ABNORMAL HIGH (ref 0.0–100.0)

## 2021-02-09 LAB — MAGNESIUM: Magnesium: 1.9 mg/dL (ref 1.7–2.4)

## 2021-02-09 MED ORDER — FUROSEMIDE 10 MG/ML IJ SOLN
40.0000 mg | Freq: Two times a day (BID) | INTRAMUSCULAR | Status: DC
  Administered 2021-02-09 – 2021-02-13 (×8): 40 mg via INTRAVENOUS
  Filled 2021-02-09 (×8): qty 4

## 2021-02-09 MED ORDER — IOHEXOL 350 MG/ML SOLN
50.0000 mL | Freq: Once | INTRAVENOUS | Status: AC | PRN
  Administered 2021-02-09: 50 mL via INTRAVENOUS

## 2021-02-09 MED ORDER — POTASSIUM CHLORIDE 10 MEQ/100ML IV SOLN
10.0000 meq | INTRAVENOUS | Status: AC
  Administered 2021-02-09 (×4): 10 meq via INTRAVENOUS
  Filled 2021-02-09 (×4): qty 100

## 2021-02-09 MED ORDER — SODIUM CHLORIDE 0.9 % IV SOLN: INTRAVENOUS | Status: DC

## 2021-02-09 MED ORDER — FUROSEMIDE 10 MG/ML IJ SOLN
40.0000 mg | Freq: Once | INTRAMUSCULAR | Status: AC
  Administered 2021-02-09: 40 mg via INTRAVENOUS
  Filled 2021-02-09: qty 4

## 2021-02-09 MED ORDER — TRAVASOL 10 % IV SOLN
INTRAVENOUS | Status: DC
  Filled 2021-02-09: qty 950.4

## 2021-02-09 MED ORDER — HEPARIN (PORCINE) 25000 UT/250ML-% IV SOLN
900.0000 [IU]/h | INTRAVENOUS | Status: DC
  Administered 2021-02-09: 950 [IU]/h via INTRAVENOUS
  Administered 2021-02-10: 900 [IU]/h via INTRAVENOUS
  Filled 2021-02-09 (×2): qty 250

## 2021-02-09 MED ORDER — POTASSIUM CHLORIDE 10 MEQ/100ML IV SOLN
10.0000 meq | INTRAVENOUS | Status: AC
  Administered 2021-02-09 – 2021-02-10 (×3): 10 meq via INTRAVENOUS
  Filled 2021-02-09 (×3): qty 100

## 2021-02-09 MED ORDER — POTASSIUM PHOSPHATES 15 MMOLE/5ML IV SOLN
30.0000 mmol | Freq: Once | INTRAVENOUS | Status: DC
  Filled 2021-02-09: qty 10

## 2021-02-09 NOTE — Progress Notes (Signed)
[facility] PROGRESS NOTE  Dana Hall GNF:[id number] DOB: 1940-05-22 DOA: 02/02/2021 PCP: [name], MD  Assessment/Plan: Active Problems:   SBO (small bowel obstruction) (HCC)     Small bowel obstruction: Postop day 5 from exploratory laparotomy with lysis of adhesions on 10/24 with Dr. Doylene Canard. Prior history of exploratory laparotomy with small bowel resection for small bowel obstruction back in 2013 by Dr. Derrell Lolling.  Currently patient on TPN nutrition. -Per surgery  Dyspnea secondary to pulmonary embolus and/or bilateral pleural effusions: Acute.  Patient complains of progressively worsening shortness of breath since the day after surgery on 10/24.  Do not see where patient was noted to be hypoxic, but she was placed on 2 L of nasal cannula oxygen due to work of breathing.Marland Kitchen  She was found to have small bilateral pulmonary emboli with large pleural effusions and significant patchy groundglass opacities concerning for edema on CTA of the chest.  Started on heparin drip and given Lasix 40 mg IV.Unclear if the pleural effusions are secondary to heart failure versus protein calorie malnutrition -Continue Strict I&Os -Check daily weights -Continuous pulse oximetry with nasal cannula oxygen to maintain O2 saturation greater than 92%  -Continue heparin per pharmacy -Add on BNP -Check echocardiogram -Lasix 40 mg IV twice daily  -Reassess IV diuresis in a.m. and adjust as needed  Hypokalemia: Acute.  Potassium was noted to be low at 3.3 this morning.  Patient had been replaced with repeat check noted to be 3.7 this afternoon. -Continue to replace as needed    DVT prophylaxsis: Heparin  Code Status:     Code Status Orders  (From admission, onward)           Start     Ordered   02/02/21 1520  Full code  Continuous        02/02/21 1520           Code Status History     Date Active Date Inactive Code Status Order ID Comments User Context   11/20/2011 1338  11/29/2011 1702 Full Code [id number]  Rometta Emery, RN Inpatient      Family Communication: family updated about patient's clinical progress Disposition Plan:  As above    Brief narrative: The patient is a 80 year old Caucasian female with a past medical history significant for but not limited to asthma, anemia, BPPV, DDD, GERD, hypertension, hyperlipidemia, history of SBO status post partial small bowel resection as well as other comorbidities who came in with new onset abdominal pain.  She states the symptoms started the day before yesterday and they felt like a cramping lower abdominal pain.  She felt nauseous and started vomiting stomach contents several times this was nonbloody nonbilious.  She states that she had a small bowel movement yesterday morning and that the abdominal pain became constant and more severe.  She went to go see her PCP who recommended that she come to the ED.  She went under further evaluation and had a CT of the abdomen pelvis which showed a high-grade small bowel obstruction to the level of a transection point in the mid pelvis.  Her labs were relatively unremarkable however she did have a hypercalcemia.  General surgery was consulted for further evaluation recommendations and she is undergoing a small bowel protocol and has an NG tube in place.  Because she failed conservative measures General surgery took the patient for an exploratory laparotomy and she had lysis of adhesions on 10/24.  Procedures: Exploratory laparotomy with lysis of adhesions 10/24  Antibiotics: Anti-infectives (From admission, onward)    None          HPI/Subjective: Patient reports that she has been getting more progressively short of breath since the day after the procedure.  Denies having any significant leg swelling or calf pain.  She reports that every time she has to go urinate she also has diarrhea.  Objective: Vitals:   02/08/21 2200 02/09/21 0200 02/09/21 0429  02/09/21 0920  BP: 130/70  (!) 164/95 (!) 151/93  Pulse:   (!) 106 (!) 105  Resp:   19 18  Temp:   98.9 F (37.2 C) 97.9 F (36.6 C)  TempSrc:    Oral  SpO2:  96% 94% 92%  Weight:      Height:        Intake/Output Summary (Last 24 hours) at 02/09/2021 1146 Last data filed at 02/09/2021 0400 Gross per 24 hour  Intake 2172.64 ml  Output 1450 ml  Net 722.64 ml    Exam:  General: Elderly female who appears to be in some mild distress Lungs: Decreased aeration especially in the lower lung fields with intermittent crackles appreciated.  Patient currently on 2 L of nasal cannula oxygen with O2 saturations maintained. Cardiovascular: Regular rate and rhythm without murmur gallop or rub.  Lower extremity edema Abdomen: Nontender, nondistended, soft, bowel sounds positive, no rebound, no ascites, no appreciable mass Extremities: No significant cyanosis, clubbing, or edema bilateral lower extremities     Data Review   Micro Results Recent Results (from the past 240 hour(s))  Resp Panel by RT-PCR (Flu A&B, Covid) Nasopharyngeal Swab     Status: None   Collection Time: 02/02/21  2:50 PM   Specimen: Nasopharyngeal Swab; Nasopharyngeal(NP) swabs in vial transport medium  Result Value Ref Range Status   SARS Coronavirus 2 by RT PCR NEGATIVE NEGATIVE Final    Comment: (NOTE) SARS-CoV-2 target nucleic acids are NOT DETECTED.  The SARS-CoV-2 RNA is generally detectable in upper respiratory specimens during the acute phase of infection. The lowest concentration of SARS-CoV-2 viral copies this assay can detect is 138 copies/mL. A negative result does not preclude SARS-Cov-2 infection and should not be used as the sole basis for treatment or other patient management decisions. A negative result may occur with  improper specimen collection/handling, submission of specimen other than nasopharyngeal swab, presence of viral mutation(s) within the areas targeted by this assay, and inadequate  number of viral copies(<138 copies/mL). A negative result must be combined with clinical observations, patient history, and epidemiological information. The expected result is Negative.  Fact Sheet for Patients:  BloggerCourse.com  Fact Sheet for Healthcare Providers:  SeriousBroker.it  This test is no t yet approved or cleared by the [place] FDA and  has been authorized for detection and/or diagnosis of SARS-CoV-2 by FDA under an Emergency Use Authorization (EUA). This EUA will remain  in effect (meaning this test can be used) for the duration of the COVID-19 declaration under Section 564(b)(1) of the Act, 21 U.S.C.section 360bbb-3(b)(1), unless the authorization is terminated  or revoked sooner.       Influenza A by PCR NEGATIVE NEGATIVE Final   Influenza B by PCR NEGATIVE NEGATIVE Final    Comment: (NOTE) The Xpert Xpress SARS-CoV-2/FLU/RSV plus assay is intended as an aid in the diagnosis of influenza from Nasopharyngeal swab specimens and should not be used as a sole basis for treatment. Nasal washings and aspirates are unacceptable for  Xpert Xpress SARS-CoV-2/FLU/RSV testing.  Fact Sheet for Patients: BloggerCourse.com  Fact Sheet for Healthcare Providers: SeriousBroker.it  This test is not yet approved or cleared by the [place] FDA and has been authorized for detection and/or diagnosis of SARS-CoV-2 by FDA under an Emergency Use Authorization (EUA). This EUA will remain in effect (meaning this test can be used) for the duration of the COVID-19 declaration under Section 564(b)(1) of the Act, 21 U.S.C. section 360bbb-3(b)(1), unless the authorization is terminated or revoked.  Performed at [facility], [address], [place], [place] [address]     Radiology Reports DG Abd 1 View  Result Date: 02/04/2021 CLINICAL DATA:  Nausea, vomiting. EXAM:  ABDOMEN - 1 VIEW COMPARISON:  February 03, 2021. FINDINGS: Nasogastric tube tip is seen in proximal stomach. Mildly dilated small bowel loops are noted concerning for ileus or possibly distal small bowel obstruction. No colonic dilatation is noted. IMPRESSION: Mildly dilated small bowel loops are noted concerning for ileus or possibly distal small bowel obstruction. Electronically Signed   By: Lupita Raider M.D.   On: 02/04/2021 08:28   CT Angio Chest Pulmonary Embolism (PE) W or WO Contrast  Result Date: 02/09/2021 CLINICAL DATA:  Chest pain shortness of breath. Recent bowel surgery. EXAM: CT ANGIOGRAPHY CHEST WITH CONTRAST TECHNIQUE: Multidetector CT imaging of the chest was performed using the standard protocol during bolus administration of intravenous contrast. Multiplanar CT image reconstructions and MIPs were obtained to evaluate the vascular anatomy. CONTRAST:  15mL OMNIPAQUE IOHEXOL 350 MG/ML SOLN COMPARISON:  None. FINDINGS: Cardiovascular: The heart is normal in size. No pericardial effusion. There is tortuosity, ectasia and calcification of thoracic aorta. No aneurysm or dissection. The pulmonary arterial tree is well opacified. Small bilateral pulmonary emboli are demonstrated most notably in the right upper lobe. No large central pulmonary emboli and no findings for right heart strain. Mediastinum/Nodes: No mediastinal or hilar mass or lymphadenopathy. The esophagus is grossly normal. Lungs/Pleura: Large bilateral pleural effusions with significant overlying atelectasis. Patchy ground-glass opacity could reflect asymmetric pulmonary edema. No pneumothorax. Upper Abdomen: No significant upper abdominal findings. Musculoskeletal: No breast masses, supraclavicular or axillary adenopathy. The bony thorax is intact. Review of the MIP images confirms the above findings. IMPRESSION: 1. Small bilateral pulmonary emboli. No large central pulmonary emboli and no findings for right heart strain. 2. Large  bilateral pleural effusions with significant overlying atelectasis. 3. Patchy ground-glass opacity could reflect asymmetric pulmonary edema. 4. Aortic atherosclerosis. These results will be called to the ordering clinician or representative by the Radiologist Assistant, and communication documented in the PACS or [employer]. Aortic Atherosclerosis (ICD10-I70.0).  Lymph lymph lymph lymph Electronically Signed   By: Rudie Meyer M.D.   On: 02/09/2021 11:07   CT Abdomen Pelvis W Contrast  Result Date: 02/02/2021 CLINICAL DATA:  Abdominal pain, tenderness EXAM: CT ABDOMEN AND PELVIS WITH CONTRAST TECHNIQUE: Multidetector CT imaging of the abdomen and pelvis was performed using the standard protocol following bolus administration of intravenous contrast. CONTRAST:  OMNIPAQUE IOHEXOL 300 MG/ML  SOLN COMPARISON:  04/10/2020 FINDINGS: Lower chest: No pleural or pericardial effusion. Fluid in the nondilated distal esophagus. Visualized lung bases normal. Hepatobiliary: No focal liver abnormality is seen. No gallstones, gallbladder wall thickening, or biliary dilatation. Pancreas: Unremarkable. No pancreatic ductal dilatation or surrounding inflammatory changes. Spleen: Normal in size without focal abnormality. Adrenals/Urinary Tract: Normal adrenal glands. No urolithiasis hydronephrosis. Left parapelvic renal cysts as before. Urinary bladder nondistended. Stomach/Bowel: Gastric body and fundus distended  by fluid. Duodenal periampullary diverticulum. Multiple dilated proximal and mid small bowel loops. Transition point in the pelvis. Distal ileal loops are decompressed through the terminal ileum. The colon is nondistended, unremarkable. Vascular/Lymphatic: Mild calcified aortic plaque without aneurysm. No abdominal or pelvic adenopathy. Reproductive: Uterus and bilateral adnexa are unremarkable. Other: Bilateral pelvic phleboliths.  No ascites.  No free air. Musculoskeletal: Spondylitic changes in the lower  lumbar spine. No fracture or worrisome bone lesion. IMPRESSION: 1. High-grade distal small bowel obstruction, transition point in the mid pelvis. No associated mass or lesion is evident, suggesting adhesions. 2.  Aortic Atherosclerosis (ICD10-170.0). Electronically Signed   By: Corlis Leak M.D.   On: 02/02/2021 13:58   DG Abd Portable 1V-Small Bowel Obstruction Protocol-initial, 8 hr delay  Result Date: 02/03/2021 CLINICAL DATA:  Small-bowel obstruction EXAM: PORTABLE ABDOMEN - 1 VIEW COMPARISON:  Earlier same day.  02/02/2021. FINDINGS: Nasogastric tube remains in the stomach. Contrast fills the fundus of the stomach. Previously administered contrast shows moderate progression throughout dilated small bowel. Contrast has not yet reached the colon. IMPRESSION: Administered contrast shows moderate progression through dilated small bowel but has not yet reached the colon. Electronically Signed   By: Paulina Fusi M.D.   On: 02/03/2021 17:16   DG Abd Portable 1V-Small Bowel Protocol-Position Verification  Result Date: 02/03/2021 CLINICAL DATA:  80 year old female with nasogastric tube in place. EXAM: PORTABLE ABDOMEN - 1 VIEW COMPARISON:  Earlier the same day FINDINGS: Gastric decompression tube has a redundant course, coiled in the gastric body with the tip near the gastroesophageal junction, similar to comparison. Scattered gaseous containing loops of small bowel are again visualized without significant distension. The colon is relatively decompressed. IMPRESSION: Indwelling gastric decompression tube remains coiled in the gastric body. Nonspecific bowel gas pattern with persistent scattered loops of gas-filled small bowel. Electronically Signed   By: Marliss Coots M.D.   On: 02/03/2021 11:05   DG Abd Portable 1V-Small Bowel Obstruction Protocol-initial, 8 hr delay  Result Date: 02/03/2021 CLINICAL DATA:  Small-bowel obstruction.  8 hour delayed film. EXAM: PORTABLE ABDOMEN - 1 VIEW COMPARISON:   Abdominal radiograph dated 02/02/2021. FINDINGS: Similar positioning of the enteric tube. Persistent dilatation of small-bowel loops measuring approximately 3.3 cm in the left lower quadrant. Excreted contrast noted in the urinary bladder. Degenerative changes of the spine. No acute osseous pathology. IMPRESSION: Persistent dilatation of small-bowel loops in the left lower quadrant. Electronically Signed   By: Elgie Collard M.D.   On: 02/03/2021 00:53   DG Abd Portable 1V-Small Bowel Protocol-Position Verification  Result Date: 02/02/2021 CLINICAL DATA:  Abdominal pain EXAM: PORTABLE ABDOMEN - 1 VIEW COMPARISON:  X-ray abdomen 11/20/2011, CT abdomen pelvis 02/02/2021 FINDINGS: Enteric tube noted coursing below the hemidiaphragm with tip and side port overlying the expected region of the gastric lumen. Excreted intravenous contrast noted within bilateral collecting systems. Some distended small bowel loops are noted in the left mid abdomen but not well visualized. IMPRESSION: Enteric tube in good position.  Could consider retraction by 5 cm. Electronically Signed   By: Tish Frederickson M.D.   On: 02/02/2021 16:12   [place] EKG SITE RITE  Result Date: 02/08/2021 If Site Rite image not attached, placement could not be confirmed due to current cardiac rhythm.    CBC Recent Labs  Lab 02/02/21 1148 02/03/21 0113 02/04/21 0027 02/05/21 0118 02/06/21 0202  WBC 19.3* 13.0* 11.3* 11.4* 9.9  HGB 15.0 14.8 14.5 14.7 12.5  HCT 45.1 43.2 44.2 45.0 38.3  PLT  341 282 286 301 238  MCV 93.8 92.3 96.7 98.3 97.0  MCH 31.2 31.6 31.7 32.1 31.6  MCHC 33.3 34.3 32.8 32.7 32.6  RDW 12.2 12.4 12.4 12.5 12.3  LYMPHSABS 0.8 0.9 1.1 0.9 1.0  MONOABS 1.4* 1.4* 1.2* 1.3* 1.1*  EOSABS 0.0 0.0 0.0 0.0 0.0  BASOSABS 0.0 0.0 0.0 0.0 0.0    Chemistries  Recent Labs  Lab 02/02/21 1148 02/03/21 0113 02/04/21 0027 02/05/21 0118 02/06/21 0202 02/08/21 0215 02/08/21 0217 02/09/21 0419  NA 132*   < > 138 139 140  --   137 136  K 4.8   < > 4.4 5.0 3.8  --  2.8* 3.3*  CL 93*   < > 99 105 107  --  107 99  CO2 28   < > 29 29 27   --  26 26  GLUCOSE 163*   < > 102* 116* 134*  --  102* 134*  BUN 22   < > 24* 26* 22  --  11 8  CREATININE 0.78   < > 0.67 0.73 0.56  --  0.44 0.42*  CALCIUM 10.4*   < > 9.0 8.3* 8.0*  --  7.4* 8.3*  MG  --   --  2.3 2.2 2.1 2.1  --  1.9  AST 29  --  23 19 18   --   --  46*  ALT 25  --  16 13 13   --   --  33  ALKPHOS 64  --  47 37* 35*  --   --  57  BILITOT 1.3*  --  1.0 0.8 1.0  --   --  1.1   < > = values in this interval not displayed.   ------------------------------------------------------------------------------------------------------------------ estimated creatinine clearance is 49.6 mL/min (A) (by C-G formula based on SCr of 0.42 mg/dL (L)). ------------------------------------------------------------------------------------------------------------------ No results for input(s): HGBA1C in the last 72 hours. ------------------------------------------------------------------------------------------------------------------ No results for input(s): CHOL, HDL, LDLCALC, TRIG, CHOLHDL, LDLDIRECT in the last 72 hours. ------------------------------------------------------------------------------------------------------------------ No results for input(s): TSH, T4TOTAL, T3FREE, THYROIDAB in the last 72 hours.  Invalid input(s): FREET3 ------------------------------------------------------------------------------------------------------------------ No results for input(s): VITAMINB12, FOLATE, FERRITIN, TIBC, IRON, RETICCTPCT in the last 72 hours.  Coagulation profile No results for input(s): INR, PROTIME in the last 168 hours.  No results for input(s): DDIMER in the last 72 hours.  Cardiac Enzymes No results for input(s): CKMB, TROPONINI, MYOGLOBIN in the last 168 hours.  Invalid input(s):  CK ------------------------------------------------------------------------------------------------------------------ Invalid input(s): POCBNP   CBG: Recent Labs  Lab 02/03/21 1856 02/08/21 2012 02/09/21 0002 02/09/21 0640  GLUCAP 117* 136* 152* 153*       Studies: CT Angio Chest Pulmonary Embolism (PE) W or WO Contrast  Result Date: 02/09/2021 CLINICAL DATA:  Chest pain shortness of breath. Recent bowel surgery. EXAM: CT ANGIOGRAPHY CHEST WITH CONTRAST TECHNIQUE: Multidetector CT imaging of the chest was performed using the standard protocol during bolus administration of intravenous contrast. Multiplanar CT image reconstructions and MIPs were obtained to evaluate the vascular anatomy. CONTRAST:  11mL OMNIPAQUE IOHEXOL 350 MG/ML SOLN COMPARISON:  None. FINDINGS: Cardiovascular: The heart is normal in size. No pericardial effusion. There is tortuosity, ectasia and calcification of thoracic aorta. No aneurysm or dissection. The pulmonary arterial tree is well opacified. Small bilateral pulmonary emboli are demonstrated most notably in the right upper lobe. No large central pulmonary emboli and no findings for right heart strain. Mediastinum/Nodes: No mediastinal or hilar mass or lymphadenopathy. The esophagus is grossly normal. Lungs/Pleura:  Large bilateral pleural effusions with significant overlying atelectasis. Patchy ground-glass opacity could reflect asymmetric pulmonary edema. No pneumothorax. Upper Abdomen: No significant upper abdominal findings. Musculoskeletal: No breast masses, supraclavicular or axillary adenopathy. The bony thorax is intact. Review of the MIP images confirms the above findings. IMPRESSION: 1. Small bilateral pulmonary emboli. No large central pulmonary emboli and no findings for right heart strain. 2. Large bilateral pleural effusions with significant overlying atelectasis. 3. Patchy ground-glass opacity could reflect asymmetric pulmonary edema. 4. Aortic  atherosclerosis. These results will be called to the ordering clinician or representative by the Radiologist Assistant, and communication documented in the PACS or [employer]. Aortic Atherosclerosis (ICD10-I70.0).  Lymph lymph lymph lymph Electronically Signed   By: Rudie Meyer M.D.   On: 02/09/2021 11:07   [place] EKG SITE RITE  Result Date: 02/08/2021 If Site Rite image not attached, placement could not be confirmed due to current cardiac rhythm.     Lab Results  Component Value Date   HGBA1C 5.3 02/05/2021   Lab Results  Component Value Date   CREATININE 0.42 (L) 02/09/2021       Scheduled Meds:  Chlorhexidine Gluconate Cloth  6 each Topical Daily   furosemide  40 mg Intravenous Once   insulin aspart  0-9 Units Subcutaneous Q6H   ketorolac  15 mg Intravenous Q6H   lidocaine  1 patch Transdermal Q24H   mouth rinse  15 mL Mouth Rinse BID   sodium chloride flush  10-40 mL Intracatheter Q12H   Continuous Infusions:  sodium chloride Stopped (02/09/21 0048)   sodium chloride     famotidine (PEPCID) IV 20 mg (02/09/21 1103)   heparin     methocarbamol (ROBAXIN) IV 500 mg (02/09/21 0636)   potassium chloride 10 mEq (02/09/21 1107)   TPN ADULT (ION) 50 mL/hr at 02/09/21 0302   TPN ADULT (ION)      Active Problems:   SBO (small bowel obstruction) (HCC)    Time spent: 45 minutes   Avilyn Virtue A Jaquane Boughner  [facility] If 7PM-7AM, please contact night-coverage at [email or web address] 02/09/2021, 11:46 AM  LOS: 7 days

## 2021-02-09 NOTE — Progress Notes (Signed)
PHARMACY - TOTAL PARENTERAL NUTRITION CONSULT NOTE   Indication: Small bowel obstruction and post-op ileus   Patient Measurements: Height: 5\' 8"  (172.7 cm) Weight: 58 kg (127 lb 13.9 oz) IBW/kg (Calculated) : 63.9 TPN AdjBW (KG): 58 Body mass index is 19.44 kg/m. Usual Weight: 58 kg  Assessment:  80 yo W with hx of SBO and ex lap in 2013 now with SBO again failing NGT decompression, gastrografin, s/p ex lap lysis of adhesions 02/04/21 now with post-op ileus. Patient with poor or no PO intake since 01/31/21. Per patient, weight at home is 122 lbs.  Weights in Epic are 127-129 lbs but patient said she was wearing heavy shoes and clothes when weighed here. Patient had diarrhea for 2 months after Christmas 2021 with limited diet (soup, rice, soft foods) and lost weight then. She has been eating more normally (lots of fruits and vegetables, chicken, fish, not much other meat) since the diarrhea resolved but still has not been able to gain much weight. Patient is at risk for refeeding given electrolyte drop on 90g of dextrose daily via IV fluids x3 days before TPN consult.   Glucose / Insulin: BG 136-153, A1C 5.3 no diabetes, required 5 units of sSSI Electrolytes: K 2.8>3.3 s/p 5 runs of IV KCl + 30 mmol IV KPhos; Phos 1.6 > 2.6, Mg 1.9; CoCa 9.34 others wnl Renal: Scr 0.42, BUN wnl  Hepatic: AST mildly up, other LFTs wnl, albumin 2.7 Intake / Output; MIVF: NS @ 31ml/hr, UOP 1.2L (0.9 mL/kg/hr), NGT 32m out GI Imaging: 10/22 CT abd: high grade distal SBO  10/23 Abd XR: persistent dilation of SB loops 10/23 Abd XR: post gastrografin: contrast with moderate progression through dilated SB 10/24 Abd XR: concerning for ileus or distal SBO GI Surgeries / Procedures:  10/24 Ex lap, LOA  Central access: PICC ordered 02/08/21 TPN start date: 02/08/21   Nutritional Goals: Goal TPN rate is 75 mL/hr (provides 95 g of protein, 261g dextrose, 56g lipids and 1825 kcals per day)  RD Assessment:  pending Estimated Needs Total Energy Estimated Needs: 1750-1950 Total Protein Estimated Needs: 85-100 grams Total Fluid Estimated Needs: > 1.7 L  Current Nutrition:  NPO + TPN @ 50 mL/hr   Plan:  -Increase TPN to goal rate of 75 mL/hr at 1800 (Provides provides 95 g of protein, 261 g dextrose, 55 g lipids and 1825 kcals per day, meeting 100% of estimated needs) -Electrolytes in TPN: Na 150 mEq/L, K 50 mEq/L, Ca 8 mEq/L, Mg 8 mEq/L, and Phos 18 mmol/L. Cl:Ac 1:1 -Add standard MVI and trace elements to TPN -HypoK likely 2/2 mild refeeding. KCl IV x 4 runs per MD today. Will plan a recheck of K this evening and further supplement as necessary  -Initiate Sensitive q6h SSI at 2000 and adjust as needed  -Add thiamine x7 days (last day 11/3) to decrease refeeding risk  -Decrease NS to [facility]  -Monitor TPN labs daily until stable at goal then on Mon/Thurs  [facility], PharmD., BCPS, BCCCP Clinical Pharmacist Please refer to [facility] for unit-specific pharmacist

## 2021-02-09 NOTE — Progress Notes (Signed)
[facility] Progress Note  5 Days Post-Op  Subjective: CC:  Now having flatus and BMs. Complains of significant air hunger and shortness of breath independent of activity. Daughter at bedside.  Objective: Vital signs in last 24 hours: Temp:  [97.9 F (36.6 C)-98.9 F (37.2 C)] 97.9 F (36.6 C) (10/29 0920) Pulse Rate:  [81-106] 105 (10/29 0920) Resp:  [18-20] 18 (10/29 0920) BP: (130-166)/(70-102) 151/93 (10/29 0920) SpO2:  [92 %-96 %] 92 % (10/29 0920) Last BM Date: 02/08/21  Intake/Output from previous day: 10/28 0701 - 10/29 0700 In: 2172.6 [I.V.:1211.8; IV Piggyback:960.8] Out: 1850 [Urine:1250; Emesis/NG output:600] Intake/Output this shift: No intake/output data recorded.  PE: Gen:  Alert, NAD, appears uncomfortable, pleasant and cooperative, appears younger than stated age. Card:  Regular rate and rhythm, pedal pulses 2+ BL Pulm:  Normal effort, clear to auscultation bilaterally Abd: Soft, mildly distended, appropriately tender, midline incision c/d/i Skin: warm and dry, no rashes  Psych: A&Ox3   Lab Results:  No results for input(s): WBC, HGB, HCT, PLT in the last 72 hours.  BMET Recent Labs    02/08/21 0217 02/09/21 0419  NA 137 136  K 2.8* 3.3*  CL 107 99  CO2 26 26  GLUCOSE 102* 134*  BUN 11 8  CREATININE 0.44 0.42*  CALCIUM 7.4* 8.3*   PT/INR No results for input(s): LABPROT, INR in the last 72 hours. CMP     Component Value Date/Time   NA 136 02/09/2021 0419   K 3.3 (L) 02/09/2021 0419   CL 99 02/09/2021 0419   CO2 26 02/09/2021 0419   GLUCOSE 134 (H) 02/09/2021 0419   BUN 8 02/09/2021 0419   CREATININE 0.42 (L) 02/09/2021 0419   CREATININE 0.50 12/22/2011 1045   CALCIUM 8.3 (L) 02/09/2021 0419   PROT 6.0 (L) 02/09/2021 0419   ALBUMIN 2.7 (L) 02/09/2021 0419   AST 46 (H) 02/09/2021 0419   ALT 33 02/09/2021 0419   ALKPHOS 57 02/09/2021 0419   BILITOT 1.1 02/09/2021 0419   GFRNONAA >60 02/09/2021 0419   GFRAA >90  11/24/2011 0409   Lipase     Component Value Date/Time   LIPASE 48 02/02/2021 1148       Studies/Results: [place] EKG SITE RITE  Result Date: 02/08/2021 If Site Rite image not attached, placement could not be confirmed due to current cardiac rhythm.   Anti-infectives: Anti-infectives (From admission, onward)    None        Assessment/Plan  SBO, recurrent POD#5 s/p exploratory laparotomy lysis of adhesions 02/04/21 Dr.Connor - prior [facility] - open appendectomy, ex lap/SBR for prior SBO 2013 Dr. Derrell Lolling  - Expected post-op ileus: Appears to be resolving based on bowel fxn but NG output remains feculent and fairly high, continue NPO NG and TPN today - mobilize - PRN analgesics - continue IV tylenol, IV toradol, robaxin, and lidoderm patch.  SOB, dyspnea, no chest pain per say - CTA chest to rule out pulmonary embolus - add IV benadryl PRN for poor sleep   FEN: NPO, NGT to LIWS, hypokalemia improving to 3.3 now - Kcl today ID: none VTE: SCD's, Lovenox  Foley: none Dispo: med-surg, await bowel function.  HTN    LOS: 7 days    Marin Olp, MD [facility] Use [email or web address] to contact on call provider

## 2021-02-09 NOTE — Progress Notes (Signed)
ANTICOAGULATION CONSULT NOTE - Initial Consult  Pharmacy Consult for heparin  Indication: pulmonary embolus  No Known Allergies  Patient Measurements: Height: 5\' 8"  (172.7 cm) Weight: 58 kg (127 lb 13.9 oz) IBW/kg (Calculated) : 63.9 Heparin Dosing Weight: 58  Vital Signs: Temp: 97.9 F (36.6 C) (10/29 0920) Temp Source: Oral (10/29 0920) BP: 151/93 (10/29 0920) Pulse Rate: 105 (10/29 0920)  Labs: Recent Labs    02/08/21 0217 02/09/21 0419  CREATININE 0.44 0.42*    Estimated Creatinine Clearance: 49.6 mL/min (A) (by C-G formula based on SCr of 0.42 mg/dL (L)).   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 Patient complaint of SOB and air hunger, CTA ordered to rule out PE. Will start heparin per surgery without bolus as patient is POD#5. Current Scr 0.42, Hgb 12.5 (10/26), plt wnl.   Goal of Therapy:  Heparin level 0.3-0.7 units/ml Monitor platelets by anticoagulation protocol: Yes   Plan:  No bolus Start heparin 950 units/hour  8-hour heparin level  Monitor for s/sx of bleed and clinical status  Daily HL and CBC   07-31-1989, PharmD, [facility]  Resident [phone] 02/09/2021 11:38 AM

## 2021-02-09 NOTE — Progress Notes (Signed)
Patient removed NG tube. On call MD paul notified.

## 2021-02-09 NOTE — Progress Notes (Signed)
ANTICOAGULATION CONSULT NOTE - Initial Consult  Pharmacy Consult for heparin  Indication: pulmonary embolus  No Known Allergies  Patient Measurements: Height: 5\' 8"  (172.7 cm) Weight: 58 kg (127 lb 13.9 oz) IBW/kg (Calculated) : 63.9 Heparin Dosing Weight: 58  Vital Signs: Temp: 97.8 F (36.6 C) (10/29 2044) Temp Source: Oral (10/29 2044) BP: 143/86 (10/29 2044) Pulse Rate: 97 (10/29 2044)  Labs: Recent Labs    02/08/21 0217 02/09/21 0419 02/09/21 2129  HEPARINUNFRC  --   --  0.12*  CREATININE 0.44 0.42*  --     Estimated Creatinine Clearance: 49.6 mL/min (A) (by C-G formula based on SCr of 0.42 mg/dL (L)).   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 Patient with SOB and air hunger and 10/29 CTA showed small BL PE, no RHS. No anticoagulation prior to admission. Pharmacy consulted for heparin.    Heparin level 0.12 is subtherapeutic on 950 units/hr. Heparin is running in L PIV. HL was drawn from PICC line by IV team. No bolus per surgery.    Goal of Therapy:  Heparin level 0.3-0.7 units/ml Monitor platelets by anticoagulation protocol: Yes   Plan:  Increase heparin 1050 units/hour   Monitor daily HL, CBC/plt Monitor for signs/symptoms of bleeding   11/29, PharmD, BCPS, BCCP Clinical Pharmacist  Please check AMION for all [facility] Pharmacy phone numbers After 10:00 PM, call Main Pharmacy [phone]

## 2021-02-10 ENCOUNTER — Other Ambulatory Visit (HOSPITAL_COMMUNITY): Payer: Medicare PPO

## 2021-02-10 ENCOUNTER — Inpatient Hospital Stay (HOSPITAL_COMMUNITY): Payer: Medicare PPO

## 2021-02-10 DIAGNOSIS — K56609 Unspecified intestinal obstruction, unspecified as to partial versus complete obstruction: Secondary | ICD-10-CM | POA: Diagnosis not present

## 2021-02-10 DIAGNOSIS — I2602 Saddle embolus of pulmonary artery with acute cor pulmonale: Secondary | ICD-10-CM | POA: Diagnosis not present

## 2021-02-10 DIAGNOSIS — I5033 Acute on chronic diastolic (congestive) heart failure: Secondary | ICD-10-CM

## 2021-02-10 DIAGNOSIS — I2609 Other pulmonary embolism with acute cor pulmonale: Secondary | ICD-10-CM

## 2021-02-10 DIAGNOSIS — R0603 Acute respiratory distress: Secondary | ICD-10-CM

## 2021-02-10 DIAGNOSIS — J9601 Acute respiratory failure with hypoxia: Secondary | ICD-10-CM

## 2021-02-10 LAB — CBC
HCT: 40.2 % (ref 36.0–46.0)
Hemoglobin: 13.6 g/dL (ref 12.0–15.0)
MCH: 31.7 pg (ref 26.0–34.0)
MCHC: 33.8 g/dL (ref 30.0–36.0)
MCV: 93.7 fL (ref 80.0–100.0)
Platelets: 336 10*3/uL (ref 150–400)
RBC: 4.29 MIL/uL (ref 3.87–5.11)
RDW: 12.5 % (ref 11.5–15.5)
WBC: 14.9 10*3/uL — ABNORMAL HIGH (ref 4.0–10.5)
nRBC: 0 % (ref 0.0–0.2)

## 2021-02-10 LAB — BLOOD GAS, ARTERIAL
Acid-Base Excess: 1.7 mmol/L (ref 0.0–2.0)
Bicarbonate: 25.4 mmol/L (ref 20.0–28.0)
FIO2: 40
O2 Saturation: 92.3 %
Patient temperature: 36.5
pCO2 arterial: 36.5 mmHg (ref 32.0–48.0)
pH, Arterial: 7.455 — ABNORMAL HIGH (ref 7.350–7.450)
pO2, Arterial: 62.1 mmHg — ABNORMAL LOW (ref 83.0–108.0)

## 2021-02-10 LAB — ECHOCARDIOGRAM COMPLETE
AV Mean grad: 2.3 mmHg
AV Peak grad: 4.3 mmHg
Ao pk vel: 1.04 m/s
Height: 68 in
S' Lateral: 3 cm
Weight: 2204.6 oz

## 2021-02-10 LAB — GLUCOSE, CAPILLARY
Glucose-Capillary: 111 mg/dL — ABNORMAL HIGH (ref 70–99)
Glucose-Capillary: 115 mg/dL — ABNORMAL HIGH (ref 70–99)
Glucose-Capillary: 133 mg/dL — ABNORMAL HIGH (ref 70–99)
Glucose-Capillary: 133 mg/dL — ABNORMAL HIGH (ref 70–99)
Glucose-Capillary: 138 mg/dL — ABNORMAL HIGH (ref 70–99)
Glucose-Capillary: 86 mg/dL (ref 70–99)

## 2021-02-10 LAB — BODY FLUID CELL COUNT WITH DIFFERENTIAL
Eos, Fluid: 0 %
Lymphs, Fluid: 38 %
Monocyte-Macrophage-Serous Fluid: 41 % — ABNORMAL LOW (ref 50–90)
Neutrophil Count, Fluid: 21 % (ref 0–25)
Total Nucleated Cell Count, Fluid: 132 cu mm (ref 0–1000)

## 2021-02-10 LAB — GRAM STAIN

## 2021-02-10 LAB — BASIC METABOLIC PANEL
Anion gap: 11 (ref 5–15)
BUN: 21 mg/dL (ref 8–23)
CO2: 28 mmol/L (ref 22–32)
Calcium: 8.1 mg/dL — ABNORMAL LOW (ref 8.9–10.3)
Chloride: 99 mmol/L (ref 98–111)
Creatinine, Ser: 0.54 mg/dL (ref 0.44–1.00)
GFR, Estimated: >60 mL/min (ref >60)
Glucose, Bld: 130 mg/dL — ABNORMAL HIGH (ref 70–99)
Potassium: 4.1 mmol/L (ref 3.5–5.1)
Sodium: 138 mmol/L (ref 135–145)

## 2021-02-10 LAB — PHOSPHORUS: Phosphorus: 3.6 mg/dL (ref 2.5–4.6)

## 2021-02-10 LAB — HEPARIN LEVEL (UNFRACTIONATED)
Heparin Unfractionated: 1.06 IU/mL — ABNORMAL HIGH (ref 0.30–0.70)
Heparin Unfractionated: <0.1 IU/mL — ABNORMAL LOW (ref 0.30–0.70)

## 2021-02-10 LAB — MAGNESIUM: Magnesium: 2 mg/dL (ref 1.7–2.4)

## 2021-02-10 LAB — ALBUMIN, PLEURAL OR PERITONEAL FLUID: Albumin, Fluid: <1.5 g/dL

## 2021-02-10 LAB — LACTATE DEHYDROGENASE, PLEURAL OR PERITONEAL FLUID: LD, Fluid: 93 U/L — ABNORMAL HIGH (ref 3–23)

## 2021-02-10 LAB — GLUCOSE, PLEURAL OR PERITONEAL FLUID: Glucose, Fluid: 136 mg/dL

## 2021-02-10 MED ORDER — LIDOCAINE HCL (PF) 1 % IJ SOLN
INTRAMUSCULAR | Status: AC
  Filled 2021-02-10: qty 30

## 2021-02-10 MED ORDER — TRACE MINERALS CU-MN-SE-ZN 300-55-60-3000 MCG/ML IV SOLN
INTRAVENOUS | Status: AC
  Filled 2021-02-10: qty 633.6

## 2021-02-10 MED ORDER — ALBUTEROL SULFATE (2.5 MG/3ML) 0.083% IN NEBU
2.5000 mg | INHALATION_SOLUTION | RESPIRATORY_TRACT | Status: DC | PRN
  Administered 2021-02-10 – 2021-02-12 (×3): 2.5 mg via RESPIRATORY_TRACT
  Filled 2021-02-10 (×4): qty 3

## 2021-02-10 MED ORDER — ENOXAPARIN SODIUM 60 MG/0.6ML IJ SOSY
60.0000 mg | PREFILLED_SYRINGE | Freq: Two times a day (BID) | INTRAMUSCULAR | Status: DC
  Administered 2021-02-10 – 2021-02-12 (×4): 60 mg via SUBCUTANEOUS
  Filled 2021-02-10 (×4): qty 0.6

## 2021-02-10 MED ORDER — KETOROLAC TROMETHAMINE 15 MG/ML IJ SOLN: 15.0000 mg | Freq: Three times a day (TID) | INTRAMUSCULAR | Status: AC | PRN

## 2021-02-10 MED ORDER — TRAMADOL HCL 50 MG PO TABS: 50.0000 mg | ORAL_TABLET | Freq: Four times a day (QID) | ORAL | Status: DC | PRN

## 2021-02-10 MED ORDER — ACETAMINOPHEN 500 MG PO TABS
1000.0000 mg | ORAL_TABLET | Freq: Four times a day (QID) | ORAL | Status: DC
  Administered 2021-02-10 – 2021-02-13 (×7): 1000 mg via ORAL
  Filled 2021-02-10 (×10): qty 2

## 2021-02-10 MED ORDER — FUROSEMIDE 10 MG/ML IJ SOLN
40.0000 mg | Freq: Once | INTRAMUSCULAR | Status: AC
  Administered 2021-02-10: 40 mg via INTRAVENOUS
  Filled 2021-02-10: qty 4

## 2021-02-10 MED ORDER — FUROSEMIDE 10 MG/ML IJ SOLN: 20.0000 mg | INTRAMUSCULAR | Status: DC

## 2021-02-10 NOTE — Progress Notes (Signed)
PROGRESS NOTE    Dana Hall  GTX:[id number] DOB: 22-Jan-1941 DOA: 02/02/2021 PCP: [name], MD   Brief Narrative:  This 80 year old female with PMH significant for  asthma, anemia, BPPV, DDD, GERD, hypertension, hyperlipidemia, history of SBO status post partial small bowel resection as well as other comorbidities who came in with new onset abdominal pain.  She went to go see her PCP who recommended that she come to the ED.  She went under further evaluation and had a CT of the abdomen pelvis which showed a high-grade small bowel obstruction to the level of a transection point in the mid pelvis.  Her labs were relatively unremarkable however she did have a hypercalcemia.  General surgery was consulted for further evaluation recommendations and she underwent small bowel protocol and has an NG tube in place. She failed conservative measures. She underwent exploratory laparotomy with lysis of adhesions on 10/24. Hospital course complicated by bilateral pleural effusion and pulmonary embolism.   Assessment & Plan:   Active Problems:   SBO (small bowel obstruction) (HCC)  Small bowel obstruction: Patient is s/p exploratory laparotomy with lysis of adhesions on 10/24 by Dr. Doylene Canard. Prior history of exploratory laparotomy with small bowel resection for small bowel obstruction back in 2013 by Dr. Derrell Lolling.   Currently patient on TPN nutrition. Patient is able to pass flatus and have a bowel movement.  She started on clear liquid diet.  Acute hypoxic respiratory failure sec. to pulmonary embolism and bilateral pleural effusion: Patient became hypoxic requiring high flow oxygen 20 L/min Patient complains of progressively worsening shortness of breath  after surgery on 10/24.   She was placed on 2 L of nasal cannula oxygen due to work of breathing.Marland Kitchen   She was found to have small bilateral pulmonary emboli with large pleural effusions and significant patchy groundglass opacities concerning for  edema on CTA of the chest.   She is started on heparin drip and given Lasix 40 mg IV.  Unclear if the pleural effusions are secondary to heart failure versus protein calorie malnutrition Continue intake output charting, daily weight. Supplemental oxygen to keep saturation above 92%. Continue IV heparin for PE, with subsequent plan to change to DOAC Obtain 2D echocardiogram Continue Lasix 40 mg twice daily Patient is s/p right thoracocentesis 850 mL of clear fluid drained.  Bilateral pulmonary embolism: Continue IV heparin with a subsequent plan to switch to DOAC.    Hypokalemia: Placed and improved.   DVT prophylaxis: Heparin Code Status:Full code. Family Communication: Husband and Son at bed side. Disposition Plan:   Status is: Inpatient  Remains inpatient appropriate because: Admitted for small bowel obstruction underwent exploratory laparotomy Hospital course complicated by bilateral pleural effusion and pulmonary embolism.  Consultants:  General surgery  Procedures: Laparotomy with lysis of adhesions Antimicrobials:   Anti-infectives (From admission, onward)    None       Subjective: Patient was seen and examined at bedside.  Overnight events noted.  Patient reports feeling better.  Patient was on 15 L of high flow nasal cannula, sats 94%.  Sitting comfortably.  Objective: Vitals:   02/10/21 0800 02/10/21 1100 02/10/21 1215 02/10/21 1224  BP: 130/83 124/67 120/83 120/83  Pulse: (!) 104 98    Resp: 20 (!) 21    Temp: 97.8 F (36.6 C) 98 F (36.7 C)    TempSrc: Oral Oral    SpO2: 98% 99% 100% 99%  Weight:      Height:  Intake/Output Summary (Last 24 hours) at 02/10/2021 1432 Last data filed at 02/10/2021 0631 Gross per 24 hour  Intake 2192.43 ml  Output 600 ml  Net 1592.43 ml   Filed Weights   02/02/21 1205 02/10/21 0351  Weight: 58 kg 62.5 kg    Examination:  General exam: Comfortable, not in any acute distress.   Deconditioned. Respiratory system: Clear to auscultation. Respiratory effort normal. Cardiovascular system: S1-S2 heard, regular rate and rhythm, no murmur.   Gastrointestinal system: Abdomen is soft, mildly distended, BS +, midline surgical scar noted with staples no erythema. Central nervous system: Alert and oriented x 3. No focal neurological deficits. Extremities: No edema, no cyanosis, no clubbing. Skin: No rashes, lesions or ulcers Psychiatry: Judgement and insight appear normal. Mood & affect appropriate.     Data Reviewed: I have personally reviewed following labs and imaging studies  CBC: Recent Labs  Lab 02/04/21 0027 02/05/21 0118 02/06/21 0202 02/10/21 0430  WBC 11.3* 11.4* 9.9 14.9*  NEUTROABS 8.9* 9.2* 7.8*  --   HGB 14.5 14.7 12.5 13.6  HCT 44.2 45.0 38.3 40.2  MCV 96.7 98.3 97.0 93.7  PLT 286 301 238 336   Basic Metabolic Panel: Recent Labs  Lab 02/05/21 0118 02/06/21 0202 02/08/21 0215 02/08/21 0217 02/09/21 0419 02/09/21 1830 02/10/21 0430  NA 139 140  --  137 136  --  138  K 5.0 3.8  --  2.8* 3.3* 3.7 4.1  CL 105 107  --  107 99  --  99  CO2 29 27  --  26 26  --  28  GLUCOSE 116* 134*  --  102* 134*  --  130*  BUN 26* 22  --  11 8  --  21  CREATININE 0.73 0.56  --  0.44 0.42*  --  0.54  CALCIUM 8.3* 8.0*  --  7.4* 8.3*  --  8.1*  MG 2.2 2.1 2.1  --  1.9  --  2.0  PHOS 3.6 1.3* 1.6*  --  2.6  --  3.6   GFR: Estimated Creatinine Clearance: 53.5 mL/min (by C-G formula based on SCr of 0.54 mg/dL). Liver Function Tests: Recent Labs  Lab 02/04/21 0027 02/05/21 0118 02/06/21 0202 02/09/21 0419  AST 23 19 18  46*  ALT 16 13 13  33  ALKPHOS 47 37* 35* 57  BILITOT 1.0 0.8 1.0 1.1  PROT 6.6 5.5* 5.2* 6.0*  ALBUMIN 3.4* 2.8* 2.5* 2.7*   No results for input(s): LIPASE, AMYLASE in the last 168 hours. No results for input(s): AMMONIA in the last 168 hours. Coagulation Profile: No results for input(s): INR, PROTIME in the last 168 hours. Cardiac  Enzymes: No results for input(s): CKTOTAL, CKMB, CKMBINDEX, TROPONINI in the last 168 hours. BNP (last 3 results) No results for input(s): PROBNP in the last 8760 hours. HbA1C: No results for input(s): HGBA1C in the last 72 hours. CBG: Recent Labs  Lab 02/09/21 1708 02/09/21 2351 02/10/21 0630 02/10/21 0806 02/10/21 1148  GLUCAP 149* 145* 86 115* 133*   Lipid Profile: Recent Labs    02/09/21 0500  TRIG 106   Thyroid Function Tests: No results for input(s): TSH, T4TOTAL, FREET4, T3FREE, THYROIDAB in the last 72 hours. Anemia Panel: No results for input(s): VITAMINB12, FOLATE, FERRITIN, TIBC, IRON, RETICCTPCT in the last 72 hours. Sepsis Labs: No results for input(s): PROCALCITON, LATICACIDVEN in the last 168 hours.  Recent Results (from the past 240 hour(s))  Resp Panel by RT-PCR (Flu A&B, Covid) Nasopharyngeal Swab  Status: None   Collection Time: 02/02/21  2:50 PM   Specimen: Nasopharyngeal Swab; Nasopharyngeal(NP) swabs in vial transport medium  Result Value Ref Range Status   SARS Coronavirus 2 by RT PCR NEGATIVE NEGATIVE Final    Comment: (NOTE) SARS-CoV-2 target nucleic acids are NOT DETECTED.  The SARS-CoV-2 RNA is generally detectable in upper respiratory specimens during the acute phase of infection. The lowest concentration of SARS-CoV-2 viral copies this assay can detect is 138 copies/mL. A negative result does not preclude SARS-Cov-2 infection and should not be used as the sole basis for treatment or other patient management decisions. A negative result may occur with  improper specimen collection/handling, submission of specimen other than nasopharyngeal swab, presence of viral mutation(s) within the areas targeted by this assay, and inadequate number of viral copies(<138 copies/mL). A negative result must be combined with clinical observations, patient history, and epidemiological information. The expected result is Negative.  Fact Sheet for  Patients:  BloggerCourse.com  Fact Sheet for Healthcare Providers:  SeriousBroker.it  This test is no t yet approved or cleared by the [place] FDA and  has been authorized for detection and/or diagnosis of SARS-CoV-2 by FDA under an Emergency Use Authorization (EUA). This EUA will remain  in effect (meaning this test can be used) for the duration of the COVID-19 declaration under Section 564(b)(1) of the Act, 21 U.S.C.section 360bbb-3(b)(1), unless the authorization is terminated  or revoked sooner.       Influenza A by PCR NEGATIVE NEGATIVE Final   Influenza B by PCR NEGATIVE NEGATIVE Final    Comment: (NOTE) The Xpert Xpress SARS-CoV-2/FLU/RSV plus assay is intended as an aid in the diagnosis of influenza from Nasopharyngeal swab specimens and should not be used as a sole basis for treatment. Nasal washings and aspirates are unacceptable for Xpert Xpress SARS-CoV-2/FLU/RSV testing.  Fact Sheet for Patients: BloggerCourse.com  Fact Sheet for Healthcare Providers: SeriousBroker.it  This test is not yet approved or cleared by the [place] FDA and has been authorized for detection and/or diagnosis of SARS-CoV-2 by FDA under an Emergency Use Authorization (EUA). This EUA will remain in effect (meaning this test can be used) for the duration of the COVID-19 declaration under Section 564(b)(1) of the Act, 21 U.S.C. section 360bbb-3(b)(1), unless the authorization is terminated or revoked.  Performed at [facility], [address], [place], [place] [address]     Radiology Studies: DG Chest 1 View  Result Date: 02/10/2021 CLINICAL DATA:  Status post RIGHT thoracentesis. EXAM: CHEST  1 VIEW COMPARISON:  02/10/2021 and prior radiographs FINDINGS: Cardiomediastinal silhouette is unchanged. Decreased RIGHT pleural effusion noted without pneumothorax. LEFT LOWER lung  consolidation/atelectasis and LEFT pleural effusion noted. A RIGHT PICC line is again identified. IMPRESSION: Decreased RIGHT pleural effusion without pneumothorax. Otherwise unchanged appearance of the chest. Electronically Signed   By: Harmon Pier M.D.   On: 02/10/2021 13:37   CT Angio Chest Pulmonary Embolism (PE) W or WO Contrast  Result Date: 02/09/2021 CLINICAL DATA:  Chest pain shortness of breath. Recent bowel surgery. EXAM: CT ANGIOGRAPHY CHEST WITH CONTRAST TECHNIQUE: Multidetector CT imaging of the chest was performed using the standard protocol during bolus administration of intravenous contrast. Multiplanar CT image reconstructions and MIPs were obtained to evaluate the vascular anatomy. CONTRAST:  29mL OMNIPAQUE IOHEXOL 350 MG/ML SOLN COMPARISON:  None. FINDINGS: Cardiovascular: The heart is normal in size. No pericardial effusion. There is tortuosity, ectasia and calcification of thoracic aorta. No aneurysm or dissection. The  pulmonary arterial tree is well opacified. Small bilateral pulmonary emboli are demonstrated most notably in the right upper lobe. No large central pulmonary emboli and no findings for right heart strain. Mediastinum/Nodes: No mediastinal or hilar mass or lymphadenopathy. The esophagus is grossly normal. Lungs/Pleura: Large bilateral pleural effusions with significant overlying atelectasis. Patchy ground-glass opacity could reflect asymmetric pulmonary edema. No pneumothorax. Upper Abdomen: No significant upper abdominal findings. Musculoskeletal: No breast masses, supraclavicular or axillary adenopathy. The bony thorax is intact. Review of the MIP images confirms the above findings. IMPRESSION: 1. Small bilateral pulmonary emboli. No large central pulmonary emboli and no findings for right heart strain. 2. Large bilateral pleural effusions with significant overlying atelectasis. 3. Patchy ground-glass opacity could reflect asymmetric pulmonary edema. 4. Aortic  atherosclerosis. These results will be called to the ordering clinician or representative by the Radiologist Assistant, and communication documented in the PACS or [employer]. Aortic Atherosclerosis (ICD10-I70.0).  Lymph lymph lymph lymph Electronically Signed   By: Rudie Meyer M.D.   On: 02/09/2021 11:07   DG CHEST PORT 1 VIEW  Result Date: 02/10/2021 CLINICAL DATA:  Acute respiratory failure with hypoxia EXAM: PORTABLE CHEST 1 VIEW COMPARISON:  06/01/2007 chest radiograph.  Chest CT from yesterday FINDINGS: Diffuse interstitial opacity and radiographically moderate pleural effusions. No pneumothorax. Partially obscured heart size which is normal. Right PICC with tip at the right atrium. IMPRESSION: CHF including bilateral pleural effusion Electronically Signed   By: Tiburcio Pea M.D.   On: 02/10/2021 04:21   US THORACENTESIS ASP PLEURAL SPACE W/IMG GUIDE  Result Date: 02/10/2021 INDICATION: Patient has small bilateral PEs as well as bilateral pleural effusions. Patient also has fluid overload from CHF. Request for diagnostic and therapeutic thoracentesis. EXAM: ULTRASOUND GUIDED DIAGNOSTIC AND THERAPEUTIC THORACENTESIS MEDICATIONS: 63ml 1% lidocaine COMPLICATIONS: None immediate. PROCEDURE: An ultrasound guided thoracentesis was thoroughly discussed with the patient and questions answered. The benefits, risks, alternatives and complications were also discussed. The patient understands and wishes to proceed with the procedure. Written consent was obtained. Ultrasound was performed to localize and mark an adequate pocket of fluid in the right chest. The area was then prepped and draped in the normal sterile fashion. 1% Lidocaine was used for local anesthesia. Under ultrasound guidance a 6 Fr Safe-T-Centesis catheter was introduced. Thoracentesis was performed. The catheter was removed and a dressing applied. FINDINGS: A total of approximately 850 cc of hazy, yellow fluid was removed. Samples  were sent to the laboratory as requested by the clinical team. IMPRESSION: Successful ultrasound guided right thoracentesis yielding 850 cc of pleural fluid. Read by: Alex Gardener, NP Electronically Signed   By: Olive Bass M.D.   On: 02/10/2021 12:53    Scheduled Meds:  acetaminophen  1,000 mg Oral Q6H   Chlorhexidine Gluconate Cloth  6 each Topical Daily   furosemide  40 mg Intravenous BID   insulin aspart  0-9 Units Subcutaneous Q6H   lidocaine  1 patch Transdermal Q24H   mouth rinse  15 mL Mouth Rinse BID   sodium chloride flush  10-40 mL Intracatheter Q12H   Continuous Infusions:  sodium chloride Stopped (02/10/21 0429)   famotidine (PEPCID) IV 20 mg (02/10/21 0855)   heparin 900 Units/hr (02/10/21 3299)   methocarbamol (ROBAXIN) IV 500 mg (02/10/21 0628)   TPN ADULT (ION)       LOS: 8 days    Time spent: 35 mins    Ori Trejos, MD [facility]   If 7PM-7AM, please contact night-coverage

## 2021-02-10 NOTE — Procedures (Signed)
PROCEDURE SUMMARY:  Successful US guided diagnostic and therapeutic right thoracentesis. Yielded 850 cc of hazy, yellow fluid. Pt tolerated procedure well. No immediate complications.  Specimen was sent for labs. CXR ordered.  EBL < 1 mL  Shon Hough, AGNP 02/10/2021 12:39 PM

## 2021-02-10 NOTE — Progress Notes (Signed)
Alerted by nurse about pt's c/o increased SOB & increased O2 reqmt Now requiring HFNC  Given increased O2 requirements and CT findings from 10/29 Will transfer to Progressive care Her PEs are small on CT but she has rather impressive pleural effusions Believe she may benefit from thoracentesis Discussed pt's clinical condition with [facility] They will assess pt and put in order for IR Pt on hep gtt for PEs Had BNP >3000, on scheduled lasix  Will need to monitor BMET labs closely given scheduled lasix, IV contrast, IV toradol, etc   Mary Sella. Andrey Campanile, MD, FACS General, Bariatric, & Minimally Invasive Surgery [facility], [place]

## 2021-02-10 NOTE — Significant Event (Addendum)
Pt seen and evaluated at bedside.  Not in respiratory distress a this time but pt with increased O2 requirements, now 10L via HFNC, being transferred to progressive care.  HR 100-110, BP 140 systolic.  CTA earlier today demonstrated small PEs and large B pleural effusions.  IR thoracentesis ordered for later this AM.  Suspect worsening O2 status either due to further PEs (additional clot burden) since the CTA vs possibly pleural effusion worsening.  Getting CXR and ABG to further investigate.  Addendum: shortly after moving pt from 6N to 4E, pt now having increased work of breathing, accessory muscle use, and respiratory distress.

## 2021-02-10 NOTE — Significant Event (Addendum)
Pt with respiratory distress, R>L crackles on ascultation.  Has progressed from belly breathing to splinting since transfer.  CXR confirming fluid overload / CHF.  BP 156/99.  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  I+Os show pt to be net positive despite starting scheduled lasix last night!  1) stopping TPN (which is why she is net positive) 2) giving another 40mg  IV lasix now 3) ABG still pending 4) having respiratory try BIPAP / CPAP for work of breathing.

## 2021-02-10 NOTE — Progress Notes (Signed)
ANTICOAGULATION CONSULT NOTE - Follow Up Consult  Pharmacy Consult for heparin Indication: pulmonary embolus  Labs: Recent Labs    02/08/21 0217 02/09/21 0419 02/09/21 2129 02/10/21 0430  HGB  --   --   --  13.6  HCT  --   --   --  40.2  PLT  --   --   --  336  HEPARINUNFRC  --   --  0.12* 1.06*  CREATININE 0.44 0.42*  --   --     Assessment: 80yo female supratherapeutic on heparin after rate change for low level; no infusion issues or signs of bleeding per RN.  Goal of Therapy:  Heparin level 0.3-0.7 units/ml   Plan:  Will decrease heparin infusion by 2-3 units/kg/hr to 800 units/hr and check level in 8 hours.    Vernard Gambles, PharmD, BCPS  02/10/2021,6:16 AM

## 2021-02-10 NOTE — Progress Notes (Signed)
Pt went into afibb this am, EKG done, MD notified. VSS, will continue to monitor.   Kalman Jewels, RN 02/10/2021 10:44 AM

## 2021-02-10 NOTE — Progress Notes (Signed)
PHARMACY - TOTAL PARENTERAL NUTRITION CONSULT NOTE   Indication: Small bowel obstruction and post-op ileus   Patient Measurements: Height: 5\' 8"  (172.7 cm) Weight: 62.5 kg (137 lb 12.6 oz) IBW/kg (Calculated) : 63.9 TPN AdjBW (KG): 62.5 Body mass index is 20.95 kg/m. Usual Weight: 58 kg  Assessment:  80 yo W with hx of SBO and ex lap in 2013 now with SBO again failing NGT decompression, gastrografin, s/p ex lap lysis of adhesions 02/04/21 now with post-op ileus. Patient with poor or no PO intake since 01/31/21. Per patient, weight at home is 122 lbs.  Weights in Epic are 127-129 lbs but patient said she was wearing heavy shoes and clothes when weighed here. Patient had diarrhea for 2 months after Christmas 2021 with limited diet (soup, rice, soft foods) and lost weight then. She has been eating more normally (lots of fruits and vegetables, chicken, fish, not much other meat) since the diarrhea resolved but still has not been able to gain much weight. Patient is at risk for refeeding given electrolyte drop on 90g of dextrose daily via IV fluids x3 days before TPN consult.   Of note, TPN was stopped overnight due to volume issues and patient was diuresed.   Glucose / Insulin: BG 86-165, A1C 5.3 no diabetes, required 5 units of sSSI Electrolytes: K 4.1; Phos 3.6, Mg 2; CoCa 9.34 others wnl Renal: Scr 0.54, BUN wnl  Hepatic: AST mildly up, other LFTs wnl, albumin 2.7 Intake / Output; MIVF: NS @ 58ml/hr, UOP 600 mL (0.4 mL/kg/hr), NGT 32m out, multiple BMs yesterday  GI Imaging: 10/22 CT abd: high grade distal SBO  10/23 Abd XR: persistent dilation of SB loops 10/23 Abd XR: post gastrografin: contrast with moderate progression through dilated SB 10/24 Abd XR: concerning for ileus or distal SBO GI Surgeries / Procedures:  10/24 Ex lap, LOA  Central access: PICC ordered 02/08/21 TPN start date: 02/08/21   Nutritional Goals: Goal TPN rate is 75 mL/hr (provides 95 g of protein, 261g  dextrose, 56g lipids and 1825 kcals per day)  RD Assessment: pending Estimated Needs Total Energy Estimated Needs: 1750-1950 Total Protein Estimated Needs: 85-100 grams Total Fluid Estimated Needs: > 1.7 L  Current Nutrition:  NPO + TPN @ 75 mL/hr (stopped overnight)  Plan:  -Concentrate TPN with adjusted goal rate of 60 mL/hr at 1800 (Provides provides 95 g of protein, 259 g dextrose, 54 g lipids and 1809 kcals per day, meeting 100% of estimated needs) -Electrolytes in TPN: Na 150 mEq/L, K 50 mEq/L, Ca 8 mEq/L, Mg 8 mEq/L, and Phos 18 mmol/L. Cl:Ac 1:1 -Add standard MVI and trace elements to TPN -Continue Sensitive q6h SSI and adjust as needed  -Add thiamine x7 days (last day 11/3) to decrease refeeding risk  -Monitor TPN labs daily until stable at goal then on Mon/Thurs -Attempting clear liquid diet today   13/3, PharmD., BCPS, BCCCP Clinical Pharmacist Please refer to [facility] for unit-specific pharmacist

## 2021-02-10 NOTE — Progress Notes (Signed)
Rapid Response Event Note   Reason for Call :  SOB increased WOB   Initial Focused Assessment:  Pt A&O x 4 c/o SOB SpO2 93% on 2L [place] increased to 3L [place]. RR 24, HR 107, BP 149/97, lungs clear and diminished, use of accessory muscles for breathing. CT results from 10/29- 1. Small bilateral pulmonary emboli. No large central pulmonary emboli and no findings for right heart strain. 2. Large bilateral pleural effusions with significant overlying atelectasis. 3. Patchy ground-glass opacity could reflect asymmetric pulmonary edema.   Interventions:  Placed on 12L [place] Paged MD    Plan of Care:  Work of breathing improved on 12L [place], will transfer to PCU for O2 management and closer monitoring. Please call RRT RN if further assistance is needed    Zenaida Niece, RN

## 2021-02-10 NOTE — Progress Notes (Signed)
TRH hospitalist night coverage.  S: This is an 80 yo F with SBO, s/p ex lap with lysis of adhesions 10/24, pt on TPN currently.  Pt with increased SOB yesterday, found to have small B PEs as well as mod-large B pleural effusions.  Started on heparin gtt, scheduled lasix, and IR thoracentesis planned for today 10/30.  Pt seen and evaluated at bedside this early AM after RN reported increased WOB and increased O2 requirement. Pt now requiring 10L via HFNC.  Pt transferred to SDU, initially not having much respiratory distress but this rapidly progressed first to "belly breathing" then splinting and RR in the 30s.    O:  I/O last 3 completed shifts: In: 3144.7 [I.V.:2033.9; IV Piggyback:1110.8] Out: 2350 [Urine:1250; Emesis/NG output:1100] Total I/O In: 10 [I.V.:10] Out: -    Vitals with BMI 02/10/2021 02/10/2021 02/09/2021  Height 5\' 8"  - -  Weight 137 lbs 13 oz - -  BMI 20.96 - -  Systolic 156 146  Diastolic 99 97 86  Pulse 109 100 97    General:  Pt with respiratory distress Eyes: PEERLA EOMI ENT: mucous membranes moist Neck: supple w/o JVD Cardiovascular: Mild tachycardia Respiratory: R>L rhonchi, wheezes, respiratory distress with accessory muscle use and splinting. Abdomen: soft, nt, nd, bs+ Skin: no rash nor lesion Musculoskeletal: MAE, full ROM all 4 extremities Psychiatric: normal tone and affect Neurologic: AAOx3, grossly non-focal  CXR ordered and has resulted showing CHF findings with B pleural effusions.    A: Despite starting scheduled lasix, pt remains net positive for the shift (due to TPN primarily).  CXR demonstrating CHF findings, elevated BP also most consistent with this.  DDx includes worsening of PE (increased clot burden), although this seems less likely given high BPs, CXR findings, positive net I+O, and good response to lasix as documented below.  P:  1) Pt given 40mg  IV lasix 2) breathing treatment by RRT 3) TPN stopped for the moment 4)  IR thoracentesis planned for this AM 5) ABG demonstrates hypoxic failure only without hypercapnia or acidosis 6) CPAP/BIPAP for WOB considered, however pt breathing already appears to be improving without this post lasix. 7) pt remains on heparin gtt for the PEs, 2d echo still ordered for today.  CRITICAL CARE Performed by: [id number].   Total critical care time: 60 minutes  Critical care time was exclusive of separately billable procedures and treating other patients.  Critical care was necessary to treat or prevent imminent or life-threatening deterioration.  Critical care was time spent personally by me on the following activities: development of treatment plan with patient and/or surrogate as well as nursing, discussions with consultants, evaluation of patient's response to treatment, examination of patient, obtaining history from patient or surrogate, ordering and performing treatments and interventions, ordering and review of laboratory studies, ordering and review of radiographic studies, pulse oximetry and re-evaluation of patient's condition.

## 2021-02-10 NOTE — Progress Notes (Addendum)
[facility] Progress Note  6 Days Post-Op  Subjective: CC:  Having multiple BMs.  No nausea.  Moved to progressive due to hypoxic respiratory failure last night.  On high flow [place], but looks much better this morning and slept great after transfer.  Daughter at bedside.  Objective: Vital signs in last 24 hours: Temp:  [97.6 F (36.4 C)-98.2 F (36.8 C)] 97.8 F (36.6 C) (10/30 0351) Pulse Rate:  [68-109] 101 (10/30 0754) Resp:  [18-25] 20 (10/30 0754) BP: (143-167)/(86-108) 156/99 (10/30 0351) SpO2:  [92 %-100 %] 99 % (10/30 0754) FiO2 (%):  [40 %-60 %] 60 % (10/30 0754) Weight:  [62.5 kg] 62.5 kg (10/30 0351) Last BM Date: 02/09/21  Intake/Output from previous day: 10/29 0701 - 10/30 0700 In: 2192.4 [I.V.:1792.4; IV Piggyback:400] Out: 1100 [Urine:600; Emesis/NG output:500] Intake/Output this shift: No intake/output data recorded.  PE: Gen:  Alert, NAD Card:  Regular rate and rhythm Pulm:  Normal effort, clear to auscultation bilaterally, Hi-flow [place] in place, sats 100% Abd: Soft, mildly distended, appropriately tender, midline incision c/d/I with staples present  Lab Results:  Recent Labs    02/10/21 0430  WBC 14.9*  HGB 13.6  HCT 40.2  PLT 336    BMET Recent Labs    02/09/21 0419 02/09/21 1830 02/10/21 0430  NA 136  --  138  K 3.3* 3.7 4.1  CL 99  --  99  CO2 26  --  28  GLUCOSE 134*  --  130*  BUN 8  --  21  CREATININE 0.42*  --  0.54  CALCIUM 8.3*  --  8.1*   PT/INR No results for input(s): LABPROT, INR in the last 72 hours. CMP     Component Value Date/Time   NA 138 02/10/2021 0430   K 4.1 02/10/2021 0430   CL 99 02/10/2021 0430   CO2 28 02/10/2021 0430   GLUCOSE 130 (H) 02/10/2021 0430   BUN 21 02/10/2021 0430   CREATININE 0.54 02/10/2021 0430   CREATININE 0.50 12/22/2011 1045   CALCIUM 8.1 (L) 02/10/2021 0430   PROT 6.0 (L) 02/09/2021 0419   ALBUMIN 2.7 (L) 02/09/2021 0419   AST 46 (H) 02/09/2021 0419   ALT 33 02/09/2021  0419   ALKPHOS 57 02/09/2021 0419   BILITOT 1.1 02/09/2021 0419   GFRNONAA >60 02/10/2021 0430   GFRAA >90 11/24/2011 0409   Lipase     Component Value Date/Time   LIPASE 48 02/02/2021 1148       Studies/Results: CT Angio Chest Pulmonary Embolism (PE) W or WO Contrast  Result Date: 02/09/2021 CLINICAL DATA:  Chest pain shortness of breath. Recent bowel surgery. EXAM: CT ANGIOGRAPHY CHEST WITH CONTRAST TECHNIQUE: Multidetector CT imaging of the chest was performed using the standard protocol during bolus administration of intravenous contrast. Multiplanar CT image reconstructions and MIPs were obtained to evaluate the vascular anatomy. CONTRAST:  52mL OMNIPAQUE IOHEXOL 350 MG/ML SOLN COMPARISON:  None. FINDINGS: Cardiovascular: The heart is normal in size. No pericardial effusion. There is tortuosity, ectasia and calcification of thoracic aorta. No aneurysm or dissection. The pulmonary arterial tree is well opacified. Small bilateral pulmonary emboli are demonstrated most notably in the right upper lobe. No large central pulmonary emboli and no findings for right heart strain. Mediastinum/Nodes: No mediastinal or hilar mass or lymphadenopathy. The esophagus is grossly normal. Lungs/Pleura: Large bilateral pleural effusions with significant overlying atelectasis. Patchy ground-glass opacity could reflect asymmetric pulmonary edema. No pneumothorax. Upper Abdomen: No significant upper abdominal  findings. Musculoskeletal: No breast masses, supraclavicular or axillary adenopathy. The bony thorax is intact. Review of the MIP images confirms the above findings. IMPRESSION: 1. Small bilateral pulmonary emboli. No large central pulmonary emboli and no findings for right heart strain. 2. Large bilateral pleural effusions with significant overlying atelectasis. 3. Patchy ground-glass opacity could reflect asymmetric pulmonary edema. 4. Aortic atherosclerosis. These results will be called to the ordering  clinician or representative by the Radiologist Assistant, and communication documented in the PACS or [employer]. Aortic Atherosclerosis (ICD10-I70.0).  Lymph lymph lymph lymph Electronically Signed   By: Marijo Sanes M.D.   On: 02/09/2021 11:07   DG CHEST PORT 1 VIEW  Result Date: 02/10/2021 CLINICAL DATA:  Acute respiratory failure with hypoxia EXAM: PORTABLE CHEST 1 VIEW COMPARISON:  06/01/2007 chest radiograph.  Chest CT from yesterday FINDINGS: Diffuse interstitial opacity and radiographically moderate pleural effusions. No pneumothorax. Partially obscured heart size which is normal. Right PICC with tip at the right atrium. IMPRESSION: CHF including bilateral pleural effusion Electronically Signed   By: Jorje Guild M.D.   On: 02/10/2021 04:21   [place] EKG SITE RITE  Result Date: 02/08/2021 If Site Rite image not attached, placement could not be confirmed due to current cardiac rhythm.   Anti-infectives: Anti-infectives (From admission, onward)    None        Assessment/Plan  SBO, recurrent POD#6 s/p exploratory laparotomy lysis of adhesions 02/04/21 Dr.Connor - ileus seems to be improving as she had multiple BMs yesterday -will try CLD diet today - mobilize - PRN analgesics - continue IV tylenol, IV toradol, robaxin, and lidoderm patch.  -WBC slight bump to 14.9, monitor  Acute hypoxic respiratory failure - see below, per medicine B Pulmonary emboli - per medicine on heparin gtt CHF/BNP >3000 - per medicine, lasix, echo pending Large B pleural effusions - per medicine, lasix and thoracentesis by IR  FEN: CLD, TNA ID: none VTE: SCD's, Lovenox  Foley: none Dispo: progressive  HTN   LOS: 8 days    Nadeen Landau, MD [facility] Use [email or web address] to contact on call provider

## 2021-02-10 NOTE — Progress Notes (Signed)
ANTICOAGULATION CONSULT NOTE - Initial Consult  Pharmacy Consult for heparin >> enox  Indication: pulmonary embolus  No Known Allergies  Patient Measurements: Height: 5\' 8"  (172.7 cm) Weight: 62.5 kg (137 lb 12.6 oz) IBW/kg (Calculated) : 63.9 Heparin Dosing Weight: 58  Vital Signs: Temp: 97.6 F (36.4 C) (10/30 1537) Temp Source: Oral (10/30 1537) BP: 94/68 (10/30 1537) Pulse Rate: 93 (10/30 1537)  Labs: Recent Labs    02/08/21 0217 02/09/21 0419 02/09/21 2129 02/10/21 0430 02/10/21 1430  HGB  --   --   --  13.6  --   HCT  --   --   --  40.2  --   PLT  --   --   --  336  --   HEPARINUNFRC  --   --  0.12* 1.06* <0.10*  CREATININE 0.44 0.42*  --  0.54  --     Estimated Creatinine Clearance: 53.5 mL/min (by C-G formula based on SCr of 0.54 mg/dL).   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 Patient with SOB and air hunger and 10/29 CTA showed small BL PE, no RHS. No anticoagulation prior to admission. Pharmacy consulted for heparin.    Heparin level < 0.1 is subtherapeutic on 900 units/hr. Patient's levels are labile with relatively small changes in heparin rate. Discussed with MD who agrees with switching to  enoxaparin treatment dose given no further procedures planned. ClCr ~37ml/min   Goal of Therapy:  Heparin level 0.3-0.7 units/ml Monitor platelets by anticoagulation protocol: Yes   Plan:  Stop heparin Start enoxapairn 60mg  Q 12 hr  Monitor daily HL, CBC/plt Monitor for signs/symptoms of bleeding  F/u switch to DOAC when can take PO   58m, PharmD, BCPS, [facility] Clinical Pharmacist  Please check AMION for all [facility] Pharmacy phone numbers After 10:00 PM, call Main Pharmacy [phone]

## 2021-02-10 NOTE — Progress Notes (Signed)
RT called to bedside to assess patient due to increased work of breathing. Patient was placed on 12L oxygen with improvement in work of breathing. RT will continue to monitor patient

## 2021-02-10 NOTE — Progress Notes (Signed)
Received a call that pt was having difficulty breathing. Pt was on 2 L via [place]. Upon assessment, noted the pt was  having labored breathing but her saturation was 92-93% on 2 L [place].Vs taken, charge nurse was with this nurse in the room. RRT. nurse was rounding and came to the pt room. RT was and on called and pt was placed on 8 L HF [place]. On call physician notified for further evaluation. MD requested for the pt to be transfer the pt a higher level of care. Pt daughter was called with update that her mother will be transferred to 4 East room # 22.

## 2021-02-11 DIAGNOSIS — K56609 Unspecified intestinal obstruction, unspecified as to partial versus complete obstruction: Secondary | ICD-10-CM | POA: Diagnosis not present

## 2021-02-11 LAB — COMPREHENSIVE METABOLIC PANEL
ALT: 47 U/L — ABNORMAL HIGH (ref 0–44)
AST: 54 U/L — ABNORMAL HIGH (ref 15–41)
Albumin: 2.4 g/dL — ABNORMAL LOW (ref 3.5–5.0)
Alkaline Phosphatase: 86 U/L (ref 38–126)
Anion gap: 12 (ref 5–15)
BUN: 29 mg/dL — ABNORMAL HIGH (ref 8–23)
CO2: 26 mmol/L (ref 22–32)
Calcium: 8.3 mg/dL — ABNORMAL LOW (ref 8.9–10.3)
Chloride: 99 mmol/L (ref 98–111)
Creatinine, Ser: 0.71 mg/dL (ref 0.44–1.00)
GFR, Estimated: >60 mL/min (ref >60)
Glucose, Bld: 182 mg/dL — ABNORMAL HIGH (ref 70–99)
Potassium: 3.2 mmol/L — ABNORMAL LOW (ref 3.5–5.1)
Sodium: 137 mmol/L (ref 135–145)
Total Bilirubin: 0.7 mg/dL (ref 0.3–1.2)
Total Protein: 5.6 g/dL — ABNORMAL LOW (ref 6.5–8.1)

## 2021-02-11 LAB — CBC
HCT: 34.8 % — ABNORMAL LOW (ref 36.0–46.0)
Hemoglobin: 11.6 g/dL — ABNORMAL LOW (ref 12.0–15.0)
MCH: 31.6 pg (ref 26.0–34.0)
MCHC: 33.3 g/dL (ref 30.0–36.0)
MCV: 94.8 fL (ref 80.0–100.0)
Platelets: 304 10*3/uL (ref 150–400)
RBC: 3.67 MIL/uL — ABNORMAL LOW (ref 3.87–5.11)
RDW: 12.8 % (ref 11.5–15.5)
WBC: 15.5 10*3/uL — ABNORMAL HIGH (ref 4.0–10.5)
nRBC: 0 % (ref 0.0–0.2)

## 2021-02-11 LAB — GLUCOSE, CAPILLARY
Glucose-Capillary: 142 mg/dL — ABNORMAL HIGH (ref 70–99)
Glucose-Capillary: 105 mg/dL — ABNORMAL HIGH (ref 70–99)
Glucose-Capillary: 114 mg/dL — ABNORMAL HIGH (ref 70–99)
Glucose-Capillary: 99 mg/dL (ref 70–99)

## 2021-02-11 LAB — CYTOLOGY - NON PAP

## 2021-02-11 LAB — TRIGLYCERIDES: Triglycerides: 124 mg/dL (ref <150)

## 2021-02-11 LAB — MAGNESIUM: Magnesium: 2 mg/dL (ref 1.7–2.4)

## 2021-02-11 LAB — PHOSPHORUS: Phosphorus: 3 mg/dL (ref 2.5–4.6)

## 2021-02-11 MED ORDER — POTASSIUM CHLORIDE CRYS ER 20 MEQ PO TBCR
40.0000 meq | EXTENDED_RELEASE_TABLET | ORAL | Status: AC
  Administered 2021-02-11 (×2): 40 meq via ORAL
  Filled 2021-02-11 (×2): qty 2

## 2021-02-11 MED ORDER — BOOST / RESOURCE BREEZE PO LIQD CUSTOM
1.0000 | Freq: Three times a day (TID) | ORAL | Status: DC
  Administered 2021-02-11 (×2): 1 via ORAL

## 2021-02-11 MED ORDER — TRACE MINERALS CU-MN-SE-ZN 300-55-60-3000 MCG/ML IV SOLN
INTRAVENOUS | Status: AC
  Filled 2021-02-11: qty 316.8

## 2021-02-11 NOTE — Progress Notes (Signed)
PHARMACY - TOTAL PARENTERAL NUTRITION CONSULT NOTE   Indication: Small bowel obstruction and post-op ileus   Patient Measurements: Height: 5\' 8"  (172.7 cm) Weight: 61 kg (134 lb 7.7 oz) IBW/kg (Calculated) : 63.9 TPN AdjBW (KG): 62.5 Body mass index is 20.45 kg/m. Usual Weight: 58 kg  Assessment:  80 yo W with hx of SBO and ex lap in 2013 now with SBO again failing NGT decompression, gastrografin, s/p ex lap lysis of adhesions 02/04/21 now with post-op ileus. Patient with poor or no PO intake since 01/31/21. Per patient, weight at home is 122 lbs.  Weights in Epic are 127-129 lbs but patient said she was wearing heavy shoes and clothes when weighed here. Patient had diarrhea for 2 months after Christmas 2021 with limited diet (soup, rice, soft foods) and lost weight then. She has been eating more normally (lots of fruits and vegetables, chicken, fish, not much other meat) since the diarrhea resolved but still has not been able to gain much weight. Patient is at risk for refeeding given electrolyte drop on 90g of dextrose daily via IV fluids x3 days before TPN consult.    Glucose / Insulin: BG <160, A1C 5.3 no diabetes, required 1 unit of sSSI Electrolytes: K 4.1; Phos 3.6, Mg 2; CoCa 9.34 others wnl Renal: Scr 0.54, BUN wnl  Hepatic: AST mildly up, other LFTs wnl, albumin 2.7 Intake / Output; MIVF: NS @KVO , Lasix 40 mg IV bid, UOP 600 mL (0.4 mL/kg/hr), NGT out, loose stools, tolerating clears but has no appetite GI Imaging: 10/22 CT abd: high grade distal SBO  10/23 Abd XR: persistent dilation of SB loops 10/23 Abd XR: post gastrografin: contrast with moderate progression through dilated SB 10/24 Abd XR: concerning for ileus or distal SBO GI Surgeries / Procedures:  10/24 Ex lap, LOA 10/30 11/24 guided right thoracentesis yielded 850 ml hazy yellow fluid  Central access: PICC ordered 02/08/21 TPN start date: 02/08/21   Nutritional Goals: Goal concentrated TPN rate is 60 mL/hr  (provides 95 g of protein, 259g dextrose, 54g lipids and 1809 kcals per day)  RD Assessment: pending Estimated Needs Total Energy Estimated Needs: 1750-1950 Total Protein Estimated Needs: 85-100 grams Total Fluid Estimated Needs: > 1.7 L  Current Nutrition:  Clear liquids + TPN @ 60 mL/hr   Plan:  -Decrease concentrated TPN to 30 ml/hr (1/2 goal rate of 60 mL/hr) at 1800 (Provides provides 47 g of protein, 129 g dextrose, 27 g lipids and 904 kcals per day, meeting 50% of estimated needs) -Electrolytes in TPN: Na 150 mEq/L, K 50 mEq/L, Ca 8 mEq/L, Mg 8 mEq/L, and Phos 18 mmol/L. Cl:Ac 1:1 -Diuresing with Lasix - replace lytes outside TPN - labs still pending for today -Add standard MVI and trace elements to TPN -Continue Sensitive q6h SSI and adjust as needed  -Add thiamine x7 days (last day 11/3) to decrease refeeding risk  -Monitor TPN labs daily until stable at goal then on Mon/Thurs -Advance to full liquids today  Thank you for involving pharmacy in this patient's care.  02/10/21, PharmD, BCPS Clinical Pharmacist Clinical phone for 02/11/2021 until 3p is [phone] 02/11/2021 7:08 AM  **Pharmacist phone directory can be found on [email or web address] listed under [facility]**

## 2021-02-11 NOTE — Care Management Important Message (Signed)
Important Message  Patient Details  Name: Dana Hall MRN: [id number] Date of Birth: [birth year]   Medicare Important Message Given:  Yes     Renie Ora 02/11/2021, 11:30 AM

## 2021-02-11 NOTE — Evaluation (Addendum)
Physical Therapy Evaluation Patient Details Name: Dana Hall MRN: [id number] DOB: 12/25/1940 Today's Date: 02/11/2021  History of Present Illness  Dana Hall is a 80 y.o. 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   Clinical Impression  Pt admitted with above diagnosis. Pt currently with functional limitations due to the deficits listed below (see PT Problem List). At the time of PT eval pt was able to perform transfers and ambulation with up to min guard assist to min assist for balance support and safety. Pt appears to have had a mild decline in function since discharge from acute PT services on 10/28. Pt on RA throughout session with inconsistent readings and waveform. Appears to be grossly in the 90's throughout mobility however with occasional dips into the low 80's (unsure how accurate this is based on waveform). Would be an excellent candidate for mobility specialist caseload. Pt will benefit from skilled PT to increase their independence and safety with mobility to allow discharge to the venue listed below.          Recommendations for follow up therapy are one component of a multi-disciplinary discharge planning process, led by the attending physician.  Recommendations may be updated based on patient status, additional functional criteria and insurance authorization.  Follow Up Recommendations Home health PT    Assistance Recommended at Discharge Intermittent Supervision/Assistance  Functional Status Assessment Patient has had a recent decline in their functional status and demonstrates the ability to make significant improvements in function in a reasonable and predictable amount of time.  Equipment Recommendations  None recommended by PT    Recommendations for Other Services       Precautions / Restrictions  Precautions Precautions: Fall Precaution Comments: Watch O2 Restrictions Weight Bearing Restrictions: No      Mobility  Bed Mobility Overal bed mobility: Needs Assistance Bed Mobility: Supine to Sit;Sit to Supine     Supine to sit: Supervision Sit to supine: Supervision   General bed mobility comments: HOB elevated for supine to sit. No assist required. Supervision for line management.    Transfers Overall transfer level: Needs assistance Equipment used: None Transfers: Sit to/from Stand Sit to Stand: Min guard           General transfer comment: Hands on guarding required due to unsteadiness and feeling of lightheadedness.    Ambulation/Gait Ambulation/Gait assistance: Min guard;Min assist Gait Distance (Feet): 250 Feet Assistive device: IV Pole Gait Pattern/deviations: Step-through pattern;Decreased stride length;Narrow base of support Gait velocity: Decreased Gait velocity interpretation: 1.31 - 2.62 ft/sec, indicative of limited community ambulator General Gait Details: Slow and guarded with IV pole for support due to feeling lightheaded. Pt without gross LOB with IV pole. Without UE support, 1 LOB laterally to the L with min assist required to recover.  Stairs            Wheelchair Mobility    Modified Rankin (Stroke Patients Only)       Balance Overall balance assessment: Needs assistance   Sitting balance-Leahy Scale: Fair     Standing balance support: During functional activity;No upper extremity supported Standing balance-Leahy Scale: Poor Standing balance comment: unsteady without UE support                             Pertinent Vitals/Pain Pain Assessment: Faces Faces Pain  Scale: Hurts little more Pain Location: lower abdomen, espeically in sitting position Pain Descriptors / Indicators: Cramping (feeling like lower abdomen is compressed) Pain Intervention(s): Limited activity within patient's tolerance;Monitored during  session;Repositioned    Home Living Family/patient expects to be discharged to:: Private residence Living Arrangements: Spouse/significant other Available Help at Discharge: Family;Available 24 hours/day Type of Home: House Home Access: Stairs to enter Entrance Stairs-Rails: Left Entrance Stairs-Number of Steps: 2   Home Layout: Multi-level;Able to live on main level with bedroom/bathroom Home Equipment: Rolling Walker (2 wheels);Rollator (4 wheels) (would have to get out of attic)      Prior Function Prior Level of Function : Independent/Modified Independent             Mobility Comments: Enjoys "playing outside", working in her vegetable garden; retired Cabin crew ed Runner, broadcasting/film/video ADLs Comments: Reports no difficulty with ADLs     Hand Dominance        Extremity/Trunk Assessment   Upper Extremity Assessment Upper Extremity Assessment: Defer to OT evaluation    Lower Extremity Assessment Lower Extremity Assessment: Generalized weakness (Mild; consistent with recent deconditioning)    Cervical / Trunk Assessment Cervical / Trunk Assessment: Other exceptions Cervical / Trunk Exceptions: Abdominal surgery  Communication   Communication: HOH  Cognition Arousal/Alertness: Awake/alert Behavior During Therapy: WFL for tasks assessed/performed Overall Cognitive Status: Within Functional Limits for tasks assessed                                          General Comments      Exercises     Assessment/Plan    PT Assessment Patient needs continued PT services  PT Problem List Decreased strength;Decreased activity tolerance;Decreased balance;Decreased mobility;Decreased knowledge of use of DME;Decreased safety awareness;Decreased knowledge of precautions;Pain       PT Treatment Interventions DME instruction;Gait training;Stair training;Functional mobility training;Therapeutic activities;Therapeutic exercise;Balance training;Neuromuscular  re-education;Patient/family education    PT Goals (Current goals can be found in the Care Plan section)  Acute Rehab PT Goals Patient Stated Goal: Home at d/c, be able to do more PT Goal Formulation: With patient Time For Goal Achievement: 02/25/21 Potential to Achieve Goals: Good    Frequency Min 3X/week   Barriers to discharge        Co-evaluation               AM-PAC PT "6 Clicks" Mobility  Outcome Measure Help needed turning from your back to your side while in a flat bed without using bedrails?: A Little Help needed moving from lying on your back to sitting on the side of a flat bed without using bedrails?: A Little Help needed moving to and from a bed to a chair (including a wheelchair)?: A Little Help needed standing up from a chair using your arms (e.g., wheelchair or bedside chair)?: A Little Help needed to walk in hospital room?: A Little Help needed climbing 3-5 steps with a railing? : A Little 6 Click Score: 18    End of Session Equipment Utilized During Treatment: Gait belt (axilla) Activity Tolerance: Patient tolerated treatment well Patient left: with family/visitor present;in bed Nurse Communication: Mobility status PT Visit Diagnosis: Unsteadiness on feet (R26.81);Pain;Dizziness and giddiness (R42) Pain - part of body:  (Lower abdomen, L Lower quadrant)    Time: 3329-5188 PT Time Calculation (min) (ACUTE ONLY): 30 min   Charges:   PT Evaluation $PT Eval Moderate Complexity:  1 Mod PT Treatments $Gait Training: 8-22 mins        Rolinda Roan, PT, DPT Acute Rehabilitation Services Pager: [phone] Office: [phone]   Thelma Comp 02/11/2021, 11:40 AM

## 2021-02-11 NOTE — Progress Notes (Addendum)
PROGRESS NOTE    Dana Hall  [id number] DOB: October 25, 1940 DOA: 02/02/2021 PCP: Donald Prose, MD   Brief Narrative:  This 80 year old female with PMH significant for  asthma, anemia, BPPV, DDD, GERD, hypertension, hyperlipidemia, history of SBO status post partial small bowel resection as well as other comorbidities who came in with new onset abdominal pain.  She went to go see her PCP who recommended that she come to the ED.  She went under further evaluation and had a CT of the abdomen pelvis which showed a high-grade small bowel obstruction to the level of a transection point in the mid pelvis.  Her labs were relatively unremarkable however she did have a hypercalcemia.  General surgery was consulted for further evaluation recommendations and she underwent small bowel protocol and has an NG tube in place. She failed conservative measures. She underwent exploratory laparotomy with lysis of adhesions on 10/24. Hospital course complicated by bilateral pleural effusion and pulmonary embolism.  Assessment & Plan:   Active Problems:   SBO (small bowel obstruction) (HCC)  Small bowel obstruction: Patient is s/p exploratory laparotomy with lysis of adhesions on 10/24 by Dr. Windle Guard. Prior history of exploratory laparotomy with small bowel resection for small bowel obstruction back in 2013 by Dr. Dalbert Batman.   Currently patient on TPN nutrition. Patient is able to pass flatus and have a bowel movement.  She started on clear liquid diet. Diet advanced to full liquid diet,  will reduce TPN by 50%.  Acute hypoxic respiratory failure  > Resolved. sec. to pulmonary embolism and bilateral pleural effusion: Patient became hypoxic requiring high flow oxygen 20 L/min Patient complains of progressively worsening shortness of breath  after surgery on 10/24.   She was placed on 2 L of nasal cannula oxygen due to work of breathing..   CTA chest showed small bilateral pulmonary emboli with large pleural  effusions and significant patchy groundglass opacities concerning for edema. She is started on heparin drip and given Lasix 40 mg IV.  Unclear if the pleural effusions are secondary to heart failure versus protein calorie malnutrition. Continue intake / output charting, daily weight. She is weaned down to room air saturating 92%. Continue IV heparin for PE, with subsequent plan to change to DOAC Continue Lasix 40 mg twice daily Patient is s/p right thoracocentesis 850 mL of clear fluid drained. Breathing is significantly improved.  Patient is weaned down to room air. 2D echocardiogram unable to determine LVEF.  Bilateral pulmonary embolism: Continue IV heparin with a subsequent plan to switch to DOAC.  Pleural effusion: S/p thoracocentesis at 50 mL of clear fluid drained. Patient reports significant improvement in breathing.   Hypokalemia: Placed and improved.   DVT prophylaxis: Heparin Code Status:Full code. Family Communication: Husband and Son at bed side. Disposition Plan:   Status is: Inpatient  Remains inpatient appropriate because: Admitted for small bowel obstruction underwent exploratory laparotomy Hospital course complicated by bilateral pleural effusion and pulmonary embolism.  Consultants:  General surgery  Procedures: Laparotomy with lysis of adhesions Antimicrobials:   Anti-infectives (From admission, onward)    None       Subjective: Patient was seen and examined at bedside.  Overnight events noted.   Patient reports feeling much improved.  She is weaned down to room air sats 94%.  She reports having abdominal soreness but otherwise doing better.    Objective: Vitals:   02/11/21 0356 02/11/21 0816 02/11/21 1132 02/11/21 1400  BP: (!) 106/59 99/76 128/74 132/73  Pulse: 86  98 (!) 102 97  Resp: 20 (!) 21 (!) 21 20  Temp: 98.2 F (36.8 C) 97.9 F (36.6 C) (!) 97.5 F (36.4 C) (!) 97.5 F (36.4 C)  TempSrc: Oral Oral Oral Oral  SpO2: 94% 95% 97%  96%  Weight: 61 kg     Height:        Intake/Output Summary (Last 24 hours) at 02/11/2021 1508 Last data filed at 02/11/2021 7867 Gross per 24 hour  Intake 924.06 ml  Output 600 ml  Net 324.06 ml   Filed Weights   02/02/21 1205 02/10/21 0351 02/11/21 0356  Weight: 58 kg 62.5 kg 61 kg    Examination:  General exam: Appears comfortable, not in any acute distress.  Deconditioned. Respiratory system: Clear to auscultation. Respiratory effort normal. Cardiovascular system: S1-S2 heard, regular rate and rhythm, no murmur.   Gastrointestinal system: Abdomen is soft, mildly distended, BS +, midline surgical scar noted with staples, no erythema. Central nervous system: Alert and oriented x 3. No focal neurological deficits. Extremities: No edema, no cyanosis, no clubbing. Skin: No rashes, lesions or ulcers Psychiatry: Judgement and insight appear normal. Mood & affect appropriate.     Data Reviewed: I have personally reviewed following labs and imaging studies  CBC: Recent Labs  Lab 02/05/21 0118 02/06/21 0202 02/10/21 0430 02/11/21 0850  WBC 11.4* 9.9 14.9* 15.5*  NEUTROABS 9.2* 7.8*  --   --   HGB 14.7 12.5 13.6 11.6*  HCT 45.0 38.3 40.2 34.8*  MCV 98.3 97.0 93.7 94.8  PLT 301 238 336 304   Basic Metabolic Panel: Recent Labs  Lab 02/06/21 0202 02/08/21 0215 02/08/21 0217 02/09/21 0419 02/09/21 1830 02/10/21 0430 02/11/21 0850  NA 140  --  137 136  --  138 137  K 3.8  --  2.8* 3.3* 3.7 4.1 3.2*  CL 107  --  107 99  --  99 99  CO2 27  --  26 26  --  28 26  GLUCOSE 134*  --  102* 134*  --  130* 182*  BUN 22  --  11 8  --  21 29*  CREATININE 0.56  --  0.44 0.42*  --  0.54 0.71  CALCIUM 8.0*  --  7.4* 8.3*  --  8.1* 8.3*  MG 2.1 2.1  --  1.9  --  2.0 2.0  PHOS 1.3* 1.6*  --  2.6  --  3.6 3.0   GFR: Estimated Creatinine Clearance: 52.2 mL/min (by C-G formula based on SCr of 0.71 mg/dL). Liver Function Tests: Recent Labs  Lab 02/05/21 0118 02/06/21 0202  02/09/21 0419 02/11/21 0850  AST 19 18 46* 54*  ALT 13 13 33 47*  ALKPHOS 37* 35* 57 86  BILITOT 0.8 1.0 1.1 0.7  PROT 5.5* 5.2* 6.0* 5.6*  ALBUMIN 2.8* 2.5* 2.7* 2.4*   No results for input(s): LIPASE, AMYLASE in the last 168 hours. No results for input(s): AMMONIA in the last 168 hours. Coagulation Profile: No results for input(s): INR, PROTIME in the last 168 hours. Cardiac Enzymes: No results for input(s): CKTOTAL, CKMB, CKMBINDEX, TROPONINI in the last 168 hours. BNP (last 3 results) No results for input(s): PROBNP in the last 8760 hours. HbA1C: No results for input(s): HGBA1C in the last 72 hours. CBG: Recent Labs  Lab 02/10/21 1708 02/10/21 2023 02/10/21 2358 02/11/21 0403 02/11/21 1439  GLUCAP 111* 133* 138* 142* 105*   Lipid Profile: Recent Labs    02/09/21 0500 02/11/21 0850  TRIG 106 124   Thyroid Function Tests: No results for input(s): TSH, T4TOTAL, FREET4, T3FREE, THYROIDAB in the last 72 hours. Anemia Panel: No results for input(s): VITAMINB12, FOLATE, FERRITIN, TIBC, IRON, RETICCTPCT in the last 72 hours. Sepsis Labs: No results for input(s): PROCALCITON, LATICACIDVEN in the last 168 hours.  Recent Results (from the past 240 hour(s))  Resp Panel by RT-PCR (Flu A&B, Covid) Nasopharyngeal Swab     Status: None   Collection Time: 02/02/21  2:50 PM   Specimen: Nasopharyngeal Swab; Nasopharyngeal(NP) swabs in vial transport medium  Result Value Ref Range Status   SARS Coronavirus 2 by RT PCR NEGATIVE NEGATIVE Final    Comment: (NOTE) SARS-CoV-2 target nucleic acids are NOT DETECTED.  The SARS-CoV-2 RNA is generally detectable in upper respiratory specimens during the acute phase of infection. The lowest concentration of SARS-CoV-2 viral copies this assay can detect is 138 copies/mL. A negative result does not preclude SARS-Cov-2 infection and should not be used as the sole basis for treatment or other patient management decisions. A negative result  may occur with  improper specimen collection/handling, submission of specimen other than nasopharyngeal swab, presence of viral mutation(s) within the areas targeted by this assay, and inadequate number of viral copies(<138 copies/mL). A negative result must be combined with clinical observations, patient history, and epidemiological information. The expected result is Negative.  Fact Sheet for Patients:  BloggerCourse.comhttps://www.fda.gov/media/152166/download  Fact Sheet for Healthcare Providers:  SeriousBroker.ithttps://www.fda.gov/media/152162/download  This test is no t yet approved or cleared by the [place] FDA and  has been authorized for detection and/or diagnosis of SARS-CoV-2 by FDA under an Emergency Use Authorization (EUA). This EUA will remain  in effect (meaning this test can be used) for the duration of the COVID-19 declaration under Section 564(b)(1) of the Act, 21 U.S.C.section 360bbb-3(b)(1), unless the authorization is terminated  or revoked sooner.       Influenza A by PCR NEGATIVE NEGATIVE Final   Influenza B by PCR NEGATIVE NEGATIVE Final    Comment: (NOTE) The Xpert Xpress SARS-CoV-2/FLU/RSV plus assay is intended as an aid in the diagnosis of influenza from Nasopharyngeal swab specimens and should not be used as a sole basis for treatment. Nasal washings and aspirates are unacceptable for Xpert Xpress SARS-CoV-2/FLU/RSV testing.  Fact Sheet for Patients: BloggerCourse.comhttps://www.fda.gov/media/152166/download  Fact Sheet for Healthcare Providers: SeriousBroker.ithttps://www.fda.gov/media/152162/download  This test is not yet approved or cleared by the [place] FDA and has been authorized for detection and/or diagnosis of SARS-CoV-2 by FDA under an Emergency Use Authorization (EUA). This EUA will remain in effect (meaning this test can be used) for the duration of the COVID-19 declaration under Section 564(b)(1) of the Act, 21 U.S.C. section 360bbb-3(b)(1), unless the authorization is terminated  or revoked.  Performed at [facility], [address] [place], [place] [address]   Gram stain     Status: None   Collection Time: 02/10/21 12:52 PM   Specimen: Fluid  Result Value Ref Range Status   Specimen Description FLUID  Final   Special Requests NONE  Final   Gram Stain   Final    CYTOSPIN SMEAR WBC SEEN NO ORGANISMS SEEN Performed at [facility], [address] [place], [place] [address]    Report Status 02/10/2021 FINAL  Final  Culture, body fluid w Gram Stain-bottle     Status: None (Preliminary result)   Collection Time: 02/10/21 12:52 PM   Specimen: Fluid  Result Value Ref Range Status  Specimen Description FLUID  Final   Special Requests NONE  Final   Culture   Final    NO GROWTH < 24 HOURS Performed at [facility], [place] [address] [place],  [address]    Report Status PENDING  Incomplete    Radiology Studies: DG Chest 1 View  Result Date: 02/10/2021 CLINICAL DATA:  Status post RIGHT thoracentesis. EXAM: CHEST  1 VIEW COMPARISON:  02/10/2021 and prior radiographs FINDINGS: Cardiomediastinal silhouette is unchanged. Decreased RIGHT pleural effusion noted without pneumothorax. LEFT LOWER lung consolidation/atelectasis and LEFT pleural effusion noted. A RIGHT PICC line is again identified. IMPRESSION: Decreased RIGHT pleural effusion without pneumothorax. Otherwise unchanged appearance of the chest. Electronically Signed   By: Margarette Canada M.D.   On: 02/10/2021 13:37   DG CHEST PORT 1 VIEW  Result Date: 02/10/2021 CLINICAL DATA:  Acute respiratory failure with hypoxia EXAM: PORTABLE CHEST 1 VIEW COMPARISON:  06/01/2007 chest radiograph.  Chest CT from yesterday FINDINGS: Diffuse interstitial opacity and radiographically moderate pleural effusions. No pneumothorax. Partially obscured heart size which is normal. Right PICC with tip at the right atrium. IMPRESSION: CHF including bilateral pleural effusion Electronically Signed   By: Jorje Guild M.D.   On: 02/10/2021 04:21   ECHOCARDIOGRAM COMPLETE  Result Date: 02/10/2021    ECHOCARDIOGRAM REPORT   Patient Name:   AVILA SANDINE Date of Exam: 02/10/2021 Medical Rec #:  [id number]          Height:       68.0 in Accession #:    [id number]         Weight:       137.8 lb Date of Birth:  06-Aug-1940           BSA:          1.744 m Patient Age:    89 years           BP:           130/83 mmHg Patient Gender: F                  HR:           106 bpm. Exam Location:  Inpatient Procedure: 2D Echo, Cardiac Doppler and Color Doppler Indications:    I26.02 Pulmonary embolus  History:        Patient has prior history of Echocardiogram examinations, most                 recent 10/20/2018. Aortic Valve Disease; Risk                 Factors:Hypertension.  Sonographer:    Glo Herring Referring Phys: [phone] ERIC [place]  1. 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 Overall, LVF appears reduced. Left ventricular endocardial border not optimally defined to evaluate regional wall motion. Left ventricular diastolic function could not be evaluated.  2. Right ventricular systolic function was not well visualized. The right ventricular size is not well visualized. There is normal pulmonary artery systolic pressure. The estimated right ventricular systolic pressure is [id number] mmHg.  3. The pericardial effusion is posterior to the left ventricle. Large pleural effusion in the left lateral region.  4. The mitral valve is normal in structure. Trivial mitral valve regurgitation. No evidence of mitral stenosis.  5. The aortic valve is tricuspid. Aortic valve regurgitation is trivial. Mild to moderate  aortic valve sclerosis/calcification is present, without any evidence of aortic stenosis.  6. The inferior vena cava is dilated in size with <50% respiratory variability, suggesting right  atrial pressure of 15 mmHg.  7. Recommend repeat limited study with definity contrast. 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 Left ventricular endocardial border not optimally defined to evaluate regional wall motion. The left ventricular internal cavity size was normal in size. There is no left ventricular hypertrophy. Abnormal (paradoxical) septal motion, consistent with left bundle branch block. Left ventricular diastolic function could not be evaluated due to atrial fibrillation. Left ventricular diastolic function could not be evaluated. Right Ventricle: The right ventricular size is not well visualized. Right vetricular wall thickness was not assessed. Right ventricular systolic function was not well visualized. There is normal pulmonary artery systolic pressure. The tricuspid regurgitant velocity is 2.22 m/s, and with an assumed right atrial pressure of 15 mmHg, the estimated right ventricular systolic pressure is 34.7 mmHg. Left Atrium: Left atrial size was normal in size. Right Atrium: Right atrial size was normal in size. Pericardium: Trivial pericardial effusion is present. The pericardial effusion is posterior to the left ventricle. Mitral Valve: The mitral valve is normal in structure. Trivial mitral valve regurgitation. No evidence of mitral valve stenosis. Tricuspid Valve: The tricuspid valve is normal in structure. Tricuspid valve regurgitation is mild . No evidence of tricuspid stenosis. Aortic Valve: The aortic valve is tricuspid. Aortic valve regurgitation is trivial. Mild to moderate aortic valve sclerosis/calcification is present, without any evidence of aortic stenosis. Aortic valve mean gradient measures 2.2 mmHg. Aortic valve peak  gradient measures 4.3 mmHg. Pulmonic Valve: The pulmonic valve was normal in structure. Pulmonic valve  regurgitation is not visualized. No evidence of pulmonic stenosis. Aorta: The aortic root is normal in size and structure. Venous: The inferior vena cava is dilated in size with less than 50% respiratory variability, suggesting right atrial pressure of 15 mmHg. IAS/Shunts: No atrial level shunt detected by color flow Doppler. Additional Comments: There is a large pleural effusion in the left lateral region.  LEFT VENTRICLE PLAX 2D LVIDd:         3.70 cm LVIDs:         3.00 cm LV PW:         1.20 cm LV IVS:        1.20 cm  RIGHT VENTRICLE          IVC RV Basal diam:  3.00 cm  IVC diam: 2.90 cm LEFT ATRIUM             Index        RIGHT ATRIUM           Index LA diam:        2.80 cm 1.61 cm/m   RA Area:     13.40 cm LA Vol (A2C):   45.5 ml 26.08 ml/m  RA Volume:   27.90 ml  15.99 ml/m LA Vol (A4C):   47.3 ml 27.11 ml/m LA Biplane Vol: 47.8 ml 27.40 ml/m  AORTIC VALVE AV Vmax:           104.18 cm/s AV Vmean:          73.475 cm/s AV VTI:            0.142 m AV Peak Grad:      4.3 mmHg AV Mean Grad:  2.2 mmHg LVOT Vmax:         61.37 cm/s LVOT Vmean:        40.033 cm/s LVOT VTI:          0.081 m LVOT/AV VTI ratio: 0.57  AORTA Ao Root diam: 2.80 cm Ao Asc diam:  2.30 cm TRICUSPID VALVE TR Peak grad:   19.7 mmHg TR Vmax:        222.00 cm/s  SHUNTS Systemic VTI: 0.08 m Fransico Him MD Electronically signed by Fransico Him MD Signature Date/Time: 02/10/2021/2:54:46 PM    Final    US THORACENTESIS ASP PLEURAL SPACE W/IMG GUIDE  Result Date: 02/10/2021 INDICATION: Patient has small bilateral PEs as well as bilateral pleural effusions. Patient also has fluid overload from CHF. Request for diagnostic and therapeutic thoracentesis. EXAM: ULTRASOUND GUIDED DIAGNOSTIC AND THERAPEUTIC THORACENTESIS MEDICATIONS: 36ml 1% lidocaine COMPLICATIONS: None immediate. PROCEDURE: An ultrasound guided thoracentesis was thoroughly discussed with the patient and questions answered. The benefits, risks, alternatives and  complications were also discussed. The patient understands and wishes to proceed with the procedure. Written consent was obtained. Ultrasound was performed to localize and mark an adequate pocket of fluid in the right chest. The area was then prepped and draped in the normal sterile fashion. 1% Lidocaine was used for local anesthesia. Under ultrasound guidance a 6 Fr Safe-T-Centesis catheter was introduced. Thoracentesis was performed. The catheter was removed and a dressing applied. FINDINGS: A total of approximately 850 cc of hazy, yellow fluid was removed. Samples were sent to the laboratory as requested by the clinical team. IMPRESSION: Successful ultrasound guided right thoracentesis yielding 850 cc of pleural fluid. Read by: Narda Rutherford, NP Electronically Signed   By: Albin Felling M.D.   On: 02/10/2021 12:53    Scheduled Meds:  acetaminophen  1,000 mg Oral Q6H   Chlorhexidine Gluconate Cloth  6 each Topical Daily   enoxaparin (LOVENOX) injection  60 mg Subcutaneous Q12H   feeding supplement  1 Container Oral TID BM   furosemide  40 mg Intravenous BID   insulin aspart  0-9 Units Subcutaneous Q6H   lidocaine  1 patch Transdermal Q24H   mouth rinse  15 mL Mouth Rinse BID   sodium chloride flush  10-40 mL Intracatheter Q12H   Continuous Infusions:  sodium chloride Stopped (02/10/21 0429)   famotidine (PEPCID) IV 20 mg (02/11/21 [id number])   methocarbamol (ROBAXIN) IV 500 mg (02/11/21 0654)   TPN ADULT (ION) 60 mL/hr at 02/11/21 0452   TPN ADULT (ION)       LOS: 9 days    Time spent: 25 mins    Yevonne Yokum, MD [facility]   If 7PM-7AM, please contact night-coverage

## 2021-02-11 NOTE — Progress Notes (Signed)
PHARMACY - TOTAL PARENTERAL NUTRITION CONSULTNOTE   Indication: Small bowel obstruction and post-op ileus   Patient Measurements: Height: _0  (172.7 cm) Weight: 61 kg (134 lb 7.7 oz) IBW/kg (Calculated) : 63.9 TPN AdjBW (KG): 62.5 Body mass index is 20.45 kg/m. Usual Weight: 58 kg  Assessment:  80 yo W with hx of SBO and ex lap in 2013 now with SBO again failing NGT decompression, gastrografin, s/p ex lap lysis of adhesions 02/04/21 now with post-op ileus. Patient with poor or no PO intake since 01/31/21. Per patient, weight at home is 122 lbs.  Weights in Epic are 127-129 lbs but patient said she was wearing heavy shoes and clothes when weighed here. Patient had diarrhea for 2 months after Christmas 2021 with limited diet (soup, rice, soft foods) and lost weight then. She has been eating more normally (lots of fruits and vegetables, chicken, fish, not much other meat) since the diarrhea resolved but still has not been able to gain much weight. Patient is at risk for refeeding given electrolyte drop on 90g of dextrose daily via IV fluids x3 days before TPN consult.   Glucose / Insulin: no hx DM, A1c 5.3% - CBGs < 180.  Required 0 unit sSSI Electrolytes: K 3.8 (goal >/= 4), others WNL (Lasix 59m IV BID and KCL 491m PO x 2 doses) Renal: SCr < 1, BUN 29 Hepatic: AST/ALT mildly elevated, albumin 2.7, TG WNL Intake / Output; MIVF: no UOP documented, LBM 10/31 GI Imaging: 10/22 CT abd: high grade distal SBO  10/23 Abd XR: persistent dilation of SB loops 10/23 Abd XR: post gastrografin: contrast with moderate progression through dilated SB 10/24 Abd XR: concerning for ileus or distal SBO GI Surgeries / Procedures:  10/24 Ex lap, LOA 10/30 US[place]ore[place]ided right thoracentesis yielded 850 ml hazy yellow fluid  Central access: PICC placed 02/08/21 TPN start date: 02/08/21   Nutritional Goals: RD Estimated Needs Total Energy Estimated Needs: 1750-1950 Total Protein Estimated Needs: 85-100  grams Total Fluid Estimated Needs: > 1.7 L  Current Nutrition:  TPN FLD - ate potato soup and mac and cheese, feels hungry, could have eaten more but didn't prefer some food offered Boost TIDBW - 2 charted given on 10/31  Plan:  Advance to soft diet and D/C TPN after today's bag per Surgery  Change Pepcid to PO KCL 2056mPO x 1 D/C SSI/CBG checks D/C TPN labs and nursing care orders  Dana Hall D. DanMina MarbleharmD, BCPS, [place]/04/2020, 8:42 AM

## 2021-02-11 NOTE — Progress Notes (Signed)
Patient ID: Dana Hall, female   DOB: 1941/03/07, 80 y.o.   MRN: [id number] [facility] Surgery Progress Note  7 Days Post-Op  Subjective: CC-  Somewhat depressed this morning. Feeling weak and tired. Can't sleep at night. Tolerating clears but has no appetite. No n/v. Passing flatus and having loose stools.  Did not get to get out of bed yesterday.  Objective: Vital signs in last 24 hours: Temp:  [97.6 F (36.4 C)-98.9 F (37.2 C)] 97.9 F (36.6 C) (10/31 0816) Pulse Rate:  [86-98] 98 (10/31 0816) Resp:  [13-25] 21 (10/31 0816) BP: (94-124)/(59-83) 99/76 (10/31 0816) SpO2:  [93 %-100 %] 95 % (10/31 0816) FiO2 (%):  [21 %-60 %] 21 % (10/30 1702) Weight:  [61 kg] 61 kg (10/31 0356) Last BM Date: 02/11/21  Intake/Output from previous day: 10/30 0701 - 10/31 0700 In: 1044.1 [P.O.:120; I.V.:724.1; IV Piggyback:200] Out: 600 [Urine:600] Intake/Output this shift: No intake/output data recorded.  PE: Gen:  Alert, NAD, pleasant Pulm:  rate and effort normal on room air Abd: Soft, mild distension, nontender, +BS, midline incision cdi with staples present and no erythema or drainage  Lab Results:  Recent Labs    02/10/21 0430 02/11/21 0850  WBC 14.9* 15.5*  HGB 13.6 11.6*  HCT 40.2 34.8*  PLT 336 304   BMET Recent Labs    02/09/21 0419 02/09/21 1830 02/10/21 0430  NA 136  --  138  K 3.3* 3.7 4.1  CL 99  --  99  CO2 26  --  28  GLUCOSE 134*  --  130*  BUN 8  --  21  CREATININE 0.42*  --  0.54  CALCIUM 8.3*  --  8.1*   PT/INR No results for input(s): LABPROT, INR in the last 72 hours. CMP     Component Value Date/Time   NA 138 02/10/2021 0430   K 4.1 02/10/2021 0430   CL 99 02/10/2021 0430   CO2 28 02/10/2021 0430   GLUCOSE 130 (H) 02/10/2021 0430   BUN 21 02/10/2021 0430   CREATININE 0.54 02/10/2021 0430   CREATININE 0.50 12/22/2011 1045   CALCIUM 8.1 (L) 02/10/2021 0430   PROT 6.0 (L) 02/09/2021 0419   ALBUMIN 2.7 (L) 02/09/2021 0419   AST  46 (H) 02/09/2021 0419   ALT 33 02/09/2021 0419   ALKPHOS 57 02/09/2021 0419   BILITOT 1.1 02/09/2021 0419   GFRNONAA >60 02/10/2021 0430   GFRAA >90 11/24/2011 0409   Lipase     Component Value Date/Time   LIPASE 48 02/02/2021 1148       Studies/Results: DG Chest 1 View  Result Date: 02/10/2021 CLINICAL DATA:  Status post RIGHT thoracentesis. EXAM: CHEST  1 VIEW COMPARISON:  02/10/2021 and prior radiographs FINDINGS: Cardiomediastinal silhouette is unchanged. Decreased RIGHT pleural effusion noted without pneumothorax. LEFT LOWER lung consolidation/atelectasis and LEFT pleural effusion noted. A RIGHT PICC line is again identified. IMPRESSION: Decreased RIGHT pleural effusion without pneumothorax. Otherwise unchanged appearance of the chest. Electronically Signed   By: Harmon Pier M.D.   On: 02/10/2021 13:37   CT Angio Chest Pulmonary Embolism (PE) W or WO Contrast  Result Date: 02/09/2021 CLINICAL DATA:  Chest pain shortness of breath. Recent bowel surgery. EXAM: CT ANGIOGRAPHY CHEST WITH CONTRAST TECHNIQUE: Multidetector CT imaging of the chest was performed using the standard protocol during bolus administration of intravenous contrast. Multiplanar CT image reconstructions and MIPs were obtained to evaluate the vascular anatomy. CONTRAST:  77mL OMNIPAQUE IOHEXOL 350 MG/ML SOLN  COMPARISON:  None. FINDINGS: Cardiovascular: The heart is normal in size. No pericardial effusion. There is tortuosity, ectasia and calcification of thoracic aorta. No aneurysm or dissection. The pulmonary arterial tree is well opacified. Small bilateral pulmonary emboli are demonstrated most notably in the right upper lobe. No large central pulmonary emboli and no findings for right heart strain. Mediastinum/Nodes: No mediastinal or hilar mass or lymphadenopathy. The esophagus is grossly normal. Lungs/Pleura: Large bilateral pleural effusions with significant overlying atelectasis. Patchy ground-glass opacity could  reflect asymmetric pulmonary edema. No pneumothorax. Upper Abdomen: No significant upper abdominal findings. Musculoskeletal: No breast masses, supraclavicular or axillary adenopathy. The bony thorax is intact. Review of the MIP images confirms the above findings. IMPRESSION: 1. Small bilateral pulmonary emboli. No large central pulmonary emboli and no findings for right heart strain. 2. Large bilateral pleural effusions with significant overlying atelectasis. 3. Patchy ground-glass opacity could reflect asymmetric pulmonary edema. 4. Aortic atherosclerosis. These results will be called to the ordering clinician or representative by the Radiologist Assistant, and communication documented in the PACS or [employer]. Aortic Atherosclerosis (ICD10-I70.0).  Lymph lymph lymph lymph Electronically Signed   By: Rudie Meyer M.D.   On: 02/09/2021 11:07   DG CHEST PORT 1 VIEW  Result Date: 02/10/2021 CLINICAL DATA:  Acute respiratory failure with hypoxia EXAM: PORTABLE CHEST 1 VIEW COMPARISON:  06/01/2007 chest radiograph.  Chest CT from yesterday FINDINGS: Diffuse interstitial opacity and radiographically moderate pleural effusions. No pneumothorax. Partially obscured heart size which is normal. Right PICC with tip at the right atrium. IMPRESSION: CHF including bilateral pleural effusion Electronically Signed   By: Tiburcio Pea M.D.   On: 02/10/2021 04:21   ECHOCARDIOGRAM COMPLETE  Result Date: 02/10/2021    ECHOCARDIOGRAM REPORT   Patient Name:   Dana Hall Date of Exam: 02/10/2021 Medical Rec #:  [id number]          Height:       68.0 in Accession #:    [id number]         Weight:       137.8 lb Date of Birth:  [birth year]           BSA:          1.744 m Patient Age:    80 years           BP:           130/83 mmHg Patient Gender: F                  HR:           106 bpm. Exam Location:  Inpatient Procedure: 2D Echo, Cardiac Doppler and Color Doppler Indications:    I26.02 Pulmonary embolus  History:         Patient has prior history of Echocardiogram examinations, most                 recent 10/20/2018. Aortic Valve Disease; Risk                 Factors:Hypertension.  Sonographer:    Vanetta Shawl Referring Phys: [id number] ERIC WILSON IMPRESSIONS  1. 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 Overall, LVF appears reduced. Left ventricular endocardial border not optimally defined to evaluate regional wall motion. Left ventricular diastolic function could not be evaluated.  2. Right ventricular systolic function was not well visualized. The right ventricular  size is not well visualized. There is normal pulmonary artery systolic pressure. The estimated right ventricular systolic pressure is 34.7 mmHg.  3. The pericardial effusion is posterior to the left ventricle. Large pleural effusion in the left lateral region.  4. The mitral valve is normal in structure. Trivial mitral valve regurgitation. No evidence of mitral stenosis.  5. The aortic valve is tricuspid. Aortic valve regurgitation is trivial. Mild to moderate aortic valve sclerosis/calcification is present, without any evidence of aortic stenosis.  6. The inferior vena cava is dilated in size with <50% respiratory variability, suggesting right atrial pressure of 15 mmHg.  7. Recommend repeat limited study with definity contrast. 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 Left ventricular endocardial border not optimally defined to evaluate regional wall motion. The left ventricular internal cavity size was normal in size. There is no left ventricular hypertrophy. Abnormal (paradoxical) septal motion, consistent with left bundle branch block. Left ventricular diastolic function could not  be evaluated due to atrial fibrillation. Left ventricular diastolic function could not be evaluated. Right Ventricle: The right ventricular size is not well visualized. Right vetricular wall thickness was not assessed. Right ventricular systolic function was not well visualized. There is normal pulmonary artery systolic pressure. The tricuspid regurgitant velocity is 2.22 m/s, and with an assumed right atrial pressure of 15 mmHg, the estimated right ventricular systolic pressure is 34.7 mmHg. Left Atrium: Left atrial size was normal in size. Right Atrium: Right atrial size was normal in size. Pericardium: Trivial pericardial effusion is present. The pericardial effusion is posterior to the left ventricle. Mitral Valve: The mitral valve is normal in structure. Trivial mitral valve regurgitation. No evidence of mitral valve stenosis. Tricuspid Valve: The tricuspid valve is normal in structure. Tricuspid valve regurgitation is mild . No evidence of tricuspid stenosis. Aortic Valve: The aortic valve is tricuspid. Aortic valve regurgitation is trivial. Mild to moderate aortic valve sclerosis/calcification is present, without any evidence of aortic stenosis. Aortic valve mean gradient measures 2.2 mmHg. Aortic valve peak  gradient measures 4.3 mmHg. Pulmonic Valve: The pulmonic valve was normal in structure. Pulmonic valve regurgitation is not visualized. No evidence of pulmonic stenosis. Aorta: The aortic root is normal in size and structure. Venous: The inferior vena cava is dilated in size with less than 50% respiratory variability, suggesting right atrial pressure of 15 mmHg. IAS/Shunts: No atrial level shunt detected by color flow Doppler. Additional Comments: There is a large pleural effusion in the left lateral region.  LEFT VENTRICLE PLAX 2D LVIDd:         3.70 cm LVIDs:         3.00 cm LV PW:         1.20 cm LV IVS:        1.20 cm  RIGHT VENTRICLE          IVC RV Basal diam:  3.00 cm  IVC diam: 2.90 cm LEFT  ATRIUM             Index        RIGHT ATRIUM           Index LA diam:        2.80 cm 1.61 cm/m   RA Area:     13.40 cm LA Vol (A2C):   45.5 ml 26.08 ml/m  RA Volume:   27.90 ml  15.99 ml/m LA  Vol (A4C):   47.3 ml 27.11 ml/m LA Biplane Vol: 47.8 ml 27.40 ml/m  AORTIC VALVE AV Vmax:           104.18 cm/s AV Vmean:          73.475 cm/s AV VTI:            0.142 m AV Peak Grad:      4.3 mmHg AV Mean Grad:      2.2 mmHg LVOT Vmax:         61.37 cm/s LVOT Vmean:        40.033 cm/s LVOT VTI:          0.081 m LVOT/AV VTI ratio: 0.57  AORTA Ao Root diam: 2.80 cm Ao Asc diam:  2.30 cm TRICUSPID VALVE TR Peak grad:   19.7 mmHg TR Vmax:        222.00 cm/s  SHUNTS Systemic VTI: 0.08 m Armanda Magic MD Electronically signed by Armanda Magic MD Signature Date/Time: 02/10/2021/2:54:46 PM    Final    US THORACENTESIS ASP PLEURAL SPACE W/IMG GUIDE  Result Date: 02/10/2021 INDICATION: Patient has small bilateral PEs as well as bilateral pleural effusions. Patient also has fluid overload from CHF. Request for diagnostic and therapeutic thoracentesis. EXAM: ULTRASOUND GUIDED DIAGNOSTIC AND THERAPEUTIC THORACENTESIS MEDICATIONS: 24ml 1% lidocaine COMPLICATIONS: None immediate. PROCEDURE: An ultrasound guided thoracentesis was thoroughly discussed with the patient and questions answered. The benefits, risks, alternatives and complications were also discussed. The patient understands and wishes to proceed with the procedure. Written consent was obtained. Ultrasound was performed to localize and mark an adequate pocket of fluid in the right chest. The area was then prepped and draped in the normal sterile fashion. 1% Lidocaine was used for local anesthesia. Under ultrasound guidance a 6 Fr Safe-T-Centesis catheter was introduced. Thoracentesis was performed. The catheter was removed and a dressing applied. FINDINGS: A total of approximately 850 cc of hazy, yellow fluid was removed. Samples were sent to the laboratory as requested by  the clinical team. IMPRESSION: Successful ultrasound guided right thoracentesis yielding 850 cc of pleural fluid. Read by: Alex Gardener, NP Electronically Signed   By: Olive Bass M.D.   On: 02/10/2021 12:53    Anti-infectives: Anti-infectives (From admission, onward)    None        Assessment/Plan SBO, recurrent POD#7 s/p exploratory laparotomy lysis of adhesions 02/04/21 Dr.Connor - ileus improving, advance to full liquids and decrease TPN to 1/2 rate. Add Boost - mobilize, will ask PT/OT to see   Acute hypoxic respiratory failure - see below, per medicine B Pulmonary emboli - per medicine on lovenox CHF/BNP >3000 - per medicine, lasix A fib Large B pleural effusions - per medicine, lasix, s/p R thoracentesis 10/30   FEN: FLD, 1/2 TNA, Boost ID: none VTE: SCD's, therapeutic Lovenox  Foley: none   HTN   LOS: 9 days    Franne Forts, [facility] 02/11/2021, 9:49 AM Please see Amion for pager number during day hours 7:00am-4:30pm

## 2021-02-11 NOTE — Progress Notes (Signed)
Mobility Specialist: Progress Note   02/11/21 1613  Mobility  Activity Ambulated in hall  Level of Assistance Modified independent, requires aide device or extra time  Assistive Device  (IV Pole)  Distance Ambulated (ft) 400 ft  Mobility Ambulated with assistance in hallway  Mobility Response Tolerated well  Mobility performed by Mobility specialist  $Mobility charge 1 Mobility   During Mobility: 94-98% SpO2 Post-Mobility: 105 HR, 95% SpO2  Pt independent to sit EOB from supine as well as to stand. Pt ambulated using IV Pole. Pt back to bed after walk with call bell and phone at her side. RN present in the room.   [facility] Treyshon [place]anon Mobility Specialist Mobility Specialist Phone: [phone]

## 2021-02-11 NOTE — Evaluation (Signed)
Occupational Therapy Evaluation Patient Details Name: Dana Hall MRN: [id number] DOB: 11-01-1940 Today's Date: 02/11/2021   History of Present Illness Dana Hall is a 80 y.o. 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   Clinical Impression   Pt is typically active and independent. She drives. Pt presents with generalized weakness, abdominal soreness and impaired standing balance. She requires up to min assist for ADL and mobility.  Pt requiring support of IV pole and hand held assist for ambulation, min guard assist with use of RW. Pt likely to progress well to return home with her husband. Recommending HHOT.      Recommendations for follow up therapy are one component of a multi-disciplinary discharge planning process, led by the attending physician.  Recommendations may be updated based on patient status, additional functional criteria and insurance authorization.   Follow Up Recommendations  Home health OT    Assistance Recommended at Discharge Intermittent Supervision/Assistance  Functional Status Assessment  Patient has had a recent decline in their functional status and demonstrates the ability to make significant improvements in function in a reasonable and predictable amount of time.  Equipment Recommendations  None recommended by OT    Recommendations for Other Services       Precautions / Restrictions Precautions Precautions: Fall Precaution Comments: Watch O2 Restrictions Weight Bearing Restrictions: No      Mobility Bed Mobility Overal bed mobility: Needs Assistance Bed Mobility: Supine to Sit;Sit to Supine     Supine to sit: Supervision Sit to supine: Supervision   General bed mobility comments: HOB elevated for supine to sit. No assist required. Supervision for line management.     Transfers Overall transfer level: Needs assistance Equipment used: Rolling walker (2 wheels) Transfers: Sit to/from Stand Sit to Stand: Min guard           General transfer comment: min guard for safety, cues for hand placement      Balance Overall balance assessment: Needs assistance   Sitting balance-Leahy Scale: Good Sitting balance - Comments: no LOB donning socks   Standing balance support: During functional activity;No upper extremity supported Standing balance-Leahy Scale: Poor Standing balance comment: Reliant on B UE support of RW                           ADL either performed or assessed with clinical judgement   ADL Overall ADL's : Needs assistance/impaired Eating/Feeding: Independent;Sitting;Bed level   Grooming: Min guard;Standing;Wash/dry hands   Upper Body Bathing: Set up;Sitting   Lower Body Bathing: Sit to/from stand;Minimal assistance   Upper Body Dressing : Set up;Sitting   Lower Body Dressing: Sit to/from stand;Minimal assistance   Toilet Transfer: Min guard;Ambulation;Rolling walker (2 wheels);BSC   Toileting- Clothing Manipulation and Hygiene: Minimal assistance;Sit to/from stand       Functional mobility during ADLs: Min guard;Rolling walker (2 wheels)       Vision Baseline Vision/History: 1 Wears glasses Ability to See in Adequate Light: 0 Adequate Patient Visual Report: No change from baseline Additional Comments: 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     Perception     Praxis      Pertinent Vitals/Pain Pain Assessment: Faces Faces Pain Scale: Hurts little more Pain Location: lower abdomen, espeically in sitting position Pain Descriptors / Indicators: Cramping Pain Intervention(s): Monitored during session;Repositioned  Hand Dominance Right   Extremity/Trunk Assessment Upper Extremity Assessment Upper Extremity Assessment: Overall WFL for tasks assessed   Lower Extremity Assessment Lower Extremity  Assessment: Defer to PT evaluation   Cervical / Trunk Assessment Cervical / Trunk Assessment: Other exceptions Cervical / Trunk Exceptions: Abdominal surgery   Communication Communication Communication: HOH   Cognition Arousal/Alertness: Awake/alert Behavior During Therapy: WFL for tasks assessed/performed Overall Cognitive Status: Within Functional Limits for tasks assessed                                       General Comments       Exercises     Shoulder Instructions      Home Living Family/patient expects to be discharged to:: Private residence Living Arrangements: Spouse/significant other Available Help at Discharge: Family;Available 24 hours/day Type of Home: House Home Access: Stairs to enter [employer] of Steps: 2 Entrance Stairs-Rails: Left Home Layout: Multi-level;Able to live on main level with bedroom/bathroom     Bathroom Shower/Tub: Producer, television/film/video: Standard     Home Equipment: Agricultural consultant (2 wheels);Rollator (4 wheels)          Prior Functioning/Environment Prior Level of Function : Independent/Modified Independent             Mobility Comments: Enjoys "playing outside", working in her vegetable garden; retired Cabin crew ed Runner, broadcasting/film/video ADLs Comments: Reports no difficulty with ADLs        OT Problem List: Decreased strength;Impaired balance (sitting and/or standing);Decreased activity tolerance;Decreased knowledge of use of DME or AE;Pain      OT Treatment/Interventions: Self-care/ADL training;DME and/or AE instruction;Therapeutic activities;Patient/family education;Balance training    OT Goals(Current goals can be found in the care plan section) Acute Rehab OT Goals Patient Stated Goal: regain strength OT Goal Formulation: With patient Time For Goal Achievement: 02/25/21 Potential to Achieve Goals: Good ADL Goals Pt Will Perform Grooming: with modified independence;standing Pt Will Perform  Lower Body Bathing: sit to/from stand;with modified independence Pt Will Perform Lower Body Dressing: with modified independence;sit to/from stand Pt Will Transfer to Toilet: with modified independence;ambulating;bedside commode (over toilet) Pt Will Perform Toileting - Clothing Manipulation and hygiene: with modified independence;sit to/from stand Additional ADL Goal #1: Pt will gather items necessary for ADL around her room modified independently.  OT Frequency: Min 2X/week   Barriers to D/C:            Co-evaluation              AM-PAC OT "6 Clicks" Daily Activity     Outcome Measure Help from another person eating meals?: None Help from another person taking care of personal grooming?: A Little Help from another person toileting, which includes using toliet, bedpan, or urinal?: A Little Help from another person bathing (including washing, rinsing, drying)?: A Little Help from another person to put on and taking off regular upper body clothing?: None Help from another person to put on and taking off regular lower body clothing?: A Little 6 Click Score: 20   End of Session Equipment Utilized During Treatment: Rolling walker (2 wheels);Gait belt  Activity Tolerance: Patient tolerated treatment well Patient left: in bed;with call bell/phone within reach;with bed alarm set  OT Visit Diagnosis: Unsteadiness on feet (R26.81);Other abnormalities of gait and mobility (R26.89);Pain;Muscle weakness (generalized) (M62.81)                Time: 6314-9702  OT Time Calculation (min): 20 min Charges:  OT General Charges $OT Visit: 1 Visit OT Evaluation $OT Eval Moderate Complexity: 1 Mod  Martie Round, OTR/L Acute Rehabilitation Services Pager: [phone] Office: [phone]   Evern Bio 02/11/2021, 11:59 AM

## 2021-02-12 DIAGNOSIS — K56609 Unspecified intestinal obstruction, unspecified as to partial versus complete obstruction: Secondary | ICD-10-CM | POA: Diagnosis not present

## 2021-02-12 LAB — CBC
HCT: 32.2 % — ABNORMAL LOW (ref 36.0–46.0)
Hemoglobin: 10.6 g/dL — ABNORMAL LOW (ref 12.0–15.0)
MCH: 32.2 pg (ref 26.0–34.0)
MCHC: 32.9 g/dL (ref 30.0–36.0)
MCV: 97.9 fL (ref 80.0–100.0)
Platelets: 310 10*3/uL (ref 150–400)
RBC: 3.29 MIL/uL — ABNORMAL LOW (ref 3.87–5.11)
RDW: 13.1 % (ref 11.5–15.5)
WBC: 13.3 10*3/uL — ABNORMAL HIGH (ref 4.0–10.5)
nRBC: 0 % (ref 0.0–0.2)

## 2021-02-12 LAB — BASIC METABOLIC PANEL
Anion gap: 7 (ref 5–15)
BUN: 28 mg/dL — ABNORMAL HIGH (ref 8–23)
CO2: 27 mmol/L (ref 22–32)
Calcium: 8.2 mg/dL — ABNORMAL LOW (ref 8.9–10.3)
Chloride: 101 mmol/L (ref 98–111)
Creatinine, Ser: 0.61 mg/dL (ref 0.44–1.00)
GFR, Estimated: >60 mL/min (ref >60)
Glucose, Bld: 120 mg/dL — ABNORMAL HIGH (ref 70–99)
Potassium: 3.8 mmol/L (ref 3.5–5.1)
Sodium: 135 mmol/L (ref 135–145)

## 2021-02-12 LAB — GLUCOSE, CAPILLARY
Glucose-Capillary: 126 mg/dL — ABNORMAL HIGH (ref 70–99)
Glucose-Capillary: 101 mg/dL — ABNORMAL HIGH (ref 70–99)

## 2021-02-12 MED ORDER — FAMOTIDINE 20 MG PO TABS
20.0000 mg | ORAL_TABLET | Freq: Every day | ORAL | Status: DC
  Administered 2021-02-12 – 2021-02-13 (×2): 20 mg via ORAL
  Filled 2021-02-12 (×2): qty 1

## 2021-02-12 MED ORDER — DIPHENHYDRAMINE HCL 50 MG/ML IJ SOLN: 12.5000 mg | Freq: Four times a day (QID) | INTRAMUSCULAR | Status: DC | PRN

## 2021-02-12 MED ORDER — APIXABAN 5 MG PO TABS
10.0000 mg | ORAL_TABLET | Freq: Two times a day (BID) | ORAL | Status: DC
  Administered 2021-02-12 – 2021-02-13 (×2): 10 mg via ORAL
  Filled 2021-02-12 (×2): qty 2

## 2021-02-12 MED ORDER — ENSURE ENLIVE PO LIQD
237.0000 mL | Freq: Three times a day (TID) | ORAL | Status: DC
  Administered 2021-02-12 – 2021-02-13 (×4): 237 mL via ORAL

## 2021-02-12 MED ORDER — POTASSIUM CHLORIDE CRYS ER 20 MEQ PO TBCR
20.0000 meq | EXTENDED_RELEASE_TABLET | Freq: Once | ORAL | Status: AC
  Administered 2021-02-12: 20 meq via ORAL
  Filled 2021-02-12: qty 1

## 2021-02-12 MED ORDER — APIXABAN 5 MG PO TABS: 5.0000 mg | ORAL_TABLET | Freq: Two times a day (BID) | ORAL | Status: DC

## 2021-02-12 MED ORDER — MELATONIN 3 MG PO TABS
3.0000 mg | ORAL_TABLET | Freq: Every day | ORAL | Status: DC
  Administered 2021-02-12: 3 mg via ORAL
  Filled 2021-02-12 (×2): qty 1

## 2021-02-12 NOTE — Progress Notes (Signed)
ANTICOAGULATION CONSULT NOTE - Follow Up Consult  Pharmacy Consult for Lovenox > Eliquis Indication: pulmonary embolus  Allergies  Allergen Reactions   Benadryl [Diphenhydramine] Anxiety    Made me crazy    Patient Measurements: Height: 5\' 8"  (172.7 cm) Weight: 61 kg (134 lb 7.7 oz) IBW/kg (Calculated) : 63.9 Lovenox Dosing Weight: 61 kg  Vital Signs: Temp: 98 F (36.7 C) (11/01 0715) Temp Source: Oral (11/01 0715) BP: 139/81 (11/01 0715) Pulse Rate: 89 (11/01 0715)  Labs: Recent Labs    02/09/21 2129 02/10/21 0430 02/10/21 0430 02/10/21 1430 02/11/21 0850 02/12/21 0700 02/12/21 0839  HGB  --  13.6   < >  --  11.6*  --  10.6*  HCT  --  40.2  --   --  34.8*  --  32.2*  PLT  --  336  --   --  304  --  310  HEPARINUNFRC 0.12* 1.06*  --  <0.10*  --   --   --   CREATININE  --  0.54  --   --  0.71 0.61  --    < > = values in this interval not displayed.    Estimated Creatinine Clearance: 52.2 mL/min (by C-G formula based on SCr of 0.61 mg/dL).  Assessment: 80 yo W with hx of SBO and ex lap in 2013 now with SBO again failing NGT decompression, gastrografin, s/p ex lap lysis of adhesions 02/04/21 and post-op ileus. TPN begun 10/28 and weaning off today.     Patient with SOB and air hunger and 10/29 CTA showed small BL PE, no RHS. No anticoagulation prior to admission. Pharmacy consulted for IV heparin dosing. Changed to full-dose Lovenox on 10/30 pm due to labile heparin levels. TPN stopping today and to transition to Eliquis.  Last Lovenox 60 mg SQ dose 0515 this am.  Has received full-dose Lovenox for 48 hours.   Goal of Therapy:  Anti-Xa level 0.6-1 units/ml 4hrs after LMWH dose given Appropriate Eliquis regimen for indication Monitor platelets by anticoagulation protocol: Yes   Plan:  Discontinue Lovenox. Eliquis 10 mg BID for 5 days to begin tonight, then 5 mg BID. Intermittent CBC. Monitor for bleeding.  11/30, RPh 02/12/2021,9:57 AM

## 2021-02-12 NOTE — Progress Notes (Signed)
Patient ID: Dana Hall, female   DOB: [birth year], 80 y.o.   MRN: [id number] [facility] Surgery Progress Note  8 Days Post-Op  Subjective: CC-  Abdomen still sore and somewhat bloated, but overall doing better. Less pain. Denies n/v. Tolerating full liquids. Passing flatus and continues to have some loose stools. Ambulated a couple times yesterday, wants to walk more today.  Objective: Vital signs in last 24 hours: Temp:  [97.5 F (36.4 C)-98.4 F (36.9 C)] 98 F (36.7 C) (11/01 0715) Pulse Rate:  [83-102] 89 (11/01 0715) Resp:  [20-26] 20 (11/01 0715) BP: (120-140)/(61-81) 139/81 (11/01 0715) SpO2:  [93 %-97 %] 95 % (11/01 0715) Last BM Date: 02/11/21  Intake/Output from previous day: 10/31 0701 - 11/01 0700 In: 1571.3 [P.O.:456; I.V.:965.3; IV Piggyback:150] Out: -  Intake/Output this shift: No intake/output data recorded.  PE: Gen:  Alert, NAD, pleasant Pulm:  rate and effort normal on room air Abd: Soft, mild distension, appropriately tender, +BS, midline incision cdi with staples present and no erythema or drainage  Lab Results:  Recent Labs    02/10/21 0430 02/11/21 0850  WBC 14.9* 15.5*  HGB 13.6 11.6*  HCT 40.2 34.8*  PLT 336 304   BMET Recent Labs    02/11/21 0850 02/12/21 0700  NA 137 135  K 3.2* 3.8  CL 99 101  CO2 26 27  GLUCOSE 182* 120*  BUN 29* 28*  CREATININE 0.71 0.61  CALCIUM 8.3* 8.2*   PT/INR No results for input(s): LABPROT, INR in the last 72 hours. CMP     Component Value Date/Time   NA 135 02/12/2021 0700   K 3.8 02/12/2021 0700   CL 101 02/12/2021 0700   CO2 27 02/12/2021 0700   GLUCOSE 120 (H) 02/12/2021 0700   BUN 28 (H) 02/12/2021 0700   CREATININE 0.61 02/12/2021 0700   CREATININE 0.50 12/22/2011 1045   CALCIUM 8.2 (L) 02/12/2021 0700   PROT 5.6 (L) 02/11/2021 0850   ALBUMIN 2.4 (L) 02/11/2021 0850   AST 54 (H) 02/11/2021 0850   ALT 47 (H) 02/11/2021 0850   ALKPHOS 86 02/11/2021 0850   BILITOT 0.7  02/11/2021 0850   GFRNONAA >60 02/12/2021 0700   GFRAA >90 11/24/2011 0409   Lipase     Component Value Date/Time   LIPASE 48 02/02/2021 1148       Studies/Results: DG Chest 1 View  Result Date: 02/10/2021 CLINICAL DATA:  Status post RIGHT thoracentesis. EXAM: CHEST  1 VIEW COMPARISON:  02/10/2021 and prior radiographs FINDINGS: Cardiomediastinal silhouette is unchanged. Decreased RIGHT pleural effusion noted without pneumothorax. LEFT LOWER lung consolidation/atelectasis and LEFT pleural effusion noted. A RIGHT PICC line is again identified. IMPRESSION: Decreased RIGHT pleural effusion without pneumothorax. Otherwise unchanged appearance of the chest. Electronically Signed   By: Margarette Canada M.D.   On: 02/10/2021 13:37   ECHOCARDIOGRAM COMPLETE  Result Date: 02/10/2021    ECHOCARDIOGRAM REPORT   Patient Name:   Dana Hall Date of Exam: 02/10/2021 Medical Rec #:  [id number]          Height:       68.0 in Accession #:    [id number]         Weight:       137.8 lb Date of Birth:  11/23/1940           BSA:          1.744 m Patient Age:    48 years  BP:           130/83 mmHg Patient Gender: F                  HR:           106 bpm. Exam Location:  Inpatient Procedure: 2D Echo, Cardiac Doppler and Color Doppler Indications:    I26.02 Pulmonary embolus  History:        Patient has prior history of Echocardiogram examinations, most                 recent 10/20/2018. Aortic Valve Disease; Risk                 Factors:Hypertension.  Sonographer:    Vanetta Shawl Referring Phys: [phone] ERIC WILSON IMPRESSIONS  1. 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 Overall, LVF appears reduced. Left ventricular endocardial border not optimally defined to evaluate regional wall motion. Left ventricular diastolic function could not be evaluated.  2. Right ventricular systolic  function was not well visualized. The right ventricular size is not well visualized. There is normal pulmonary artery systolic pressure. The estimated right ventricular systolic pressure is 34.7 mmHg.  3. The pericardial effusion is posterior to the left ventricle. Large pleural effusion in the left lateral region.  4. The mitral valve is normal in structure. Trivial mitral valve regurgitation. No evidence of mitral stenosis.  5. The aortic valve is tricuspid. Aortic valve regurgitation is trivial. Mild to moderate aortic valve sclerosis/calcification is present, without any evidence of aortic stenosis.  6. The inferior vena cava is dilated in size with <50% respiratory variability, suggesting right atrial pressure of 15 mmHg.  7. Recommend repeat limited study with definity contrast. 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 Left ventricular endocardial border not optimally defined to evaluate regional wall motion. The left ventricular internal cavity size was normal in size. There is no left ventricular hypertrophy. Abnormal (paradoxical) septal motion, consistent with left bundle branch block. Left ventricular diastolic function could not be evaluated due to atrial fibrillation. Left ventricular diastolic function could not be evaluated. Right Ventricle: The right ventricular size is not well visualized. Right vetricular wall thickness was not assessed. Right ventricular systolic function was not well visualized. There is normal pulmonary artery systolic pressure. The tricuspid regurgitant velocity is 2.22 m/s, and with an assumed right atrial pressure of 15 mmHg, the estimated right ventricular systolic pressure is 34.7 mmHg. Left Atrium: Left atrial size was normal in size. Right Atrium: Right atrial size was normal in size. Pericardium: Trivial pericardial  effusion is present. The pericardial effusion is posterior to the left ventricle. Mitral Valve: The mitral valve is normal in structure. Trivial mitral valve regurgitation. No evidence of mitral valve stenosis. Tricuspid Valve: The tricuspid valve is normal in structure. Tricuspid valve regurgitation is mild . No evidence of tricuspid stenosis. Aortic Valve: The aortic valve is tricuspid. Aortic valve regurgitation is trivial. Mild to moderate aortic valve sclerosis/calcification is present, without any evidence of aortic stenosis. Aortic valve mean gradient measures 2.2 mmHg. Aortic valve peak  gradient measures 4.3 mmHg. Pulmonic Valve: The pulmonic valve was normal in structure. Pulmonic valve regurgitation is not visualized. No evidence of pulmonic stenosis. Aorta: The aortic root is normal in size and structure.  Venous: The inferior vena cava is dilated in size with less than 50% respiratory variability, suggesting right atrial pressure of 15 mmHg. IAS/Shunts: No atrial level shunt detected by color flow Doppler. Additional Comments: There is a large pleural effusion in the left lateral region.  LEFT VENTRICLE PLAX 2D LVIDd:         3.70 cm LVIDs:         3.00 cm LV PW:         1.20 cm LV IVS:        1.20 cm  RIGHT VENTRICLE          IVC RV Basal diam:  3.00 cm  IVC diam: 2.90 cm LEFT ATRIUM             Index        RIGHT ATRIUM           Index LA diam:        2.80 cm 1.61 cm/m   RA Area:     13.40 cm LA Vol (A2C):   45.5 ml 26.08 ml/m  RA Volume:   27.90 ml  15.99 ml/m LA Vol (A4C):   47.3 ml 27.11 ml/m LA Biplane Vol: 47.8 ml 27.40 ml/m  AORTIC VALVE AV Vmax:           104.18 cm/s AV Vmean:          73.475 cm/s AV VTI:            0.142 m AV Peak Grad:      4.3 mmHg AV Mean Grad:      2.2 mmHg LVOT Vmax:         61.37 cm/s LVOT Vmean:        40.033 cm/s LVOT VTI:          0.081 m LVOT/AV VTI ratio: 0.57  AORTA Ao Root diam: 2.80 cm Ao Asc diam:  2.30 cm TRICUSPID VALVE TR Peak grad:   19.7 mmHg TR Vmax:         222.00 cm/s  SHUNTS Systemic VTI: 0.08 m Fransico Him MD Electronically signed by Fransico Him MD Signature Date/Time: 02/10/2021/2:54:46 PM    Final    US THORACENTESIS ASP PLEURAL SPACE W/IMG GUIDE  Result Date: 02/10/2021 INDICATION: Patient has small bilateral PEs as well as bilateral pleural effusions. Patient also has fluid overload from CHF. Request for diagnostic and therapeutic thoracentesis. EXAM: ULTRASOUND GUIDED DIAGNOSTIC AND THERAPEUTIC THORACENTESIS MEDICATIONS: 68ml 1% lidocaine COMPLICATIONS: None immediate. PROCEDURE: An ultrasound guided thoracentesis was thoroughly discussed with the patient and questions answered. The benefits, risks, alternatives and complications were also discussed. The patient understands and wishes to proceed with the procedure. Written consent was obtained. Ultrasound was performed to localize and mark an adequate pocket of fluid in the right chest. The area was then prepped and draped in the normal sterile fashion. 1% Lidocaine was used for local anesthesia. Under ultrasound guidance a 6 Fr Safe-T-Centesis catheter was introduced. Thoracentesis was performed. The catheter was removed and a dressing applied. FINDINGS: A total of approximately 850 cc of hazy, yellow fluid was removed. Samples were sent to the laboratory as requested by the clinical team. IMPRESSION: Successful ultrasound guided right thoracentesis yielding 850 cc of pleural fluid. Read by: Narda Rutherford, NP Electronically Signed   By: Albin Felling M.D.   On: 02/10/2021 12:53    Anti-infectives: Anti-infectives (From admission, onward)    None        Assessment/Plan SBO, recurrent POD#8 s/p  exploratory laparotomy lysis of adhesions 02/04/21 Dr.Connor - labs pending - Advance to soft diet and d/c TPN after today. Add ensure.  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

## 2021-02-12 NOTE — Progress Notes (Signed)
PROGRESS NOTE    MEAH SELESKY  [id number] DOB: 12-18-1940 DOA: 02/02/2021 PCP: Donald Prose, MD   Brief Narrative:  This 80 year old female with PMH significant for  asthma, anemia, BPPV, DDD, GERD, hypertension, hyperlipidemia, history of SBO status post partial small bowel resection as well as other comorbidities who came in with new onset abdominal pain.  She went to go see her PCP who recommended that she come to the ED.  She went under further evaluation and had a CT of the abdomen pelvis which showed a high-grade small bowel obstruction to the level of a transection point in the mid pelvis.  Her labs were relatively unremarkable however she did have a hypercalcemia.  General surgery was consulted for further evaluation and she underwent small bowel protocol and has an NG tube in place. She failed conservative measures. She underwent exploratory laparotomy with lysis of adhesions on 10/24. Hospital course complicated by bilateral pleural effusion and pulmonary embolism.  Patient is started on heparin and subsequently transitioned to Eliquis.  Patient underwent right thoracocentesis, feels much improved.  SBO has resolved.  Patient is started on soft diet.  Assessment & Plan:   Active Problems:   SBO (small bowel obstruction) (HCC)  Small bowel obstruction: > Resolved. Patient is s/p exploratory laparotomy with lysis of adhesions on 10/24 by Dr. Windle Guard. Prior history of exploratory laparotomy with small bowel resection for small bowel obstruction back in 2013 by Dr. Dalbert Batman.   Patient was on TPN during hospital course. Patient is able to pass flatus and have a bowel movement.  SBO resolved. Diet advance to soft bland diet.  TPN discontinued.  Acute hypoxic respiratory failure  > Resolved. sec. to pulmonary embolism and bilateral pleural effusion: Patient became hypoxic requiring high flow oxygen 20 L/min after surgery on 10/24.   Now weaned down to room air, SPO2 100% CTA chest  showed small bilateral pulmonary emboli with large pleural effusions and significant patchy groundglass opacities concerning for edema. She was started on heparin drip and given Lasix 40 mg IV.  Unclear if the pleural effusions are secondary to heart failure versus protein calorie malnutrition. Continued on IV heparin, subsequently transitioned to Eliquis on 11/1 Continue Lasix 40 mg twice daily Patient is s/p right thoracocentesis 850 mL of clear fluid drained. Breathing is significantly improved.  Patient is weaned down to room air. 2D echocardiogram unable to determine LVEF.  Bilateral pulmonary embolism: Continued on IV heparin, subsequently transitioned to Eliquis.  Pleural effusion: S/p thoracocentesis at 50 mL of clear fluid drained. Patient reports significant improvement in breathing.   Hypokalemia: Placed and improved.   DVT prophylaxis: Eliquis Code Status:Full code. Family Communication: Husband and Son at bed side. Disposition Plan:   Status is: Inpatient  Remains inpatient appropriate because: Admitted for small bowel obstruction underwent exploratory laparotomy Hospital course complicated by bilateral pleural effusion and pulmonary embolism.  Consultants:  General surgery  Procedures: Laparotomy with lysis of adhesions Antimicrobials:   Anti-infectives (From admission, onward)    None       Subjective: Patient was seen and examined at bedside.  Overnight events noted.   Patient reports feeling much improved.  She is weaned down to room air sats 94%.  She still reports having abdominal soreness but otherwise doing better.   Patient has tolerated soft bland diet, she has ambulated in the hallway, feels better.  Objective: Vitals:   02/11/21 2006 02/12/21 0411 02/12/21 0715 02/12/21 1230  BP: 120/61 140/76 139/81 126/64  Pulse:  92 83 89 81  Resp: 20 20 20 20   Temp: 98.4 F (36.9 C) 97.9 F (36.6 C) 98 F (36.7 C) 97.8 F (36.6 C)  TempSrc: Oral  Oral Oral Oral  SpO2: 93% 96% 95% 95%  Weight:      Height:        Intake/Output Summary (Last 24 hours) at 02/12/2021 1239 Last data filed at 02/12/2021 0153 Gross per 24 hour  Intake 1233.31 ml  Output --  Net 1233.31 ml   Filed Weights   02/02/21 1205 02/10/21 0351 02/11/21 0356  Weight: 58 kg 62.5 kg 61 kg    Examination:  General exam: Appears comfortable, not in any acute distress.  Deconditioned. Respiratory system: Clear to auscultation. Respiratory effort normal. Cardiovascular system: S1-S2 heard, regular rate and rhythm, no murmur.   Gastrointestinal system: Abdomen is soft, mildly distended, BS +, midline surgical scar noted with staples, no erythema. Central nervous system: Alert and oriented x 3. No focal neurological deficits. Extremities: No edema, no cyanosis, no clubbing. Skin: No rashes, lesions or ulcers Psychiatry: Judgement and insight appear normal. Mood & affect appropriate.     Data Reviewed: I have personally reviewed following labs and imaging studies  CBC: Recent Labs  Lab 02/06/21 0202 02/10/21 0430 02/11/21 0850 02/12/21 0839  WBC 9.9 14.9* 15.5* 13.3*  NEUTROABS 7.8*  --   --   --   HGB 12.5 13.6 11.6* 10.6*  HCT 38.3 40.2 34.8* 32.2*  MCV 97.0 93.7 94.8 97.9  PLT 238 336 304 [id number]   Basic Metabolic Panel: Recent Labs  Lab 02/06/21 0202 02/08/21 0215 02/08/21 0217 02/09/21 0419 02/09/21 1830 02/10/21 0430 02/11/21 0850 02/12/21 0700  NA 140  --  137 136  --  138 137 135  K 3.8  --  2.8* 3.3* 3.7 4.1 3.2* 3.8  CL 107  --  107 99  --  99 99 101  CO2 27  --  26 26  --  28 26 27   GLUCOSE 134*  --  102* 134*  --  130* 182* 120*  BUN 22  --  11 8  --  21 29* 28*  CREATININE 0.56  --  0.44 0.42*  --  0.54 0.71 0.61  CALCIUM 8.0*  --  7.4* 8.3*  --  8.1* 8.3* 8.2*  MG 2.1 2.1  --  1.9  --  2.0 2.0  --   PHOS 1.3* 1.6*  --  2.6  --  3.6 3.0  --    GFR: Estimated Creatinine Clearance: 52.2 mL/min (by C-G formula based on SCr of  0.61 mg/dL). Liver Function Tests: Recent Labs  Lab 02/06/21 0202 02/09/21 0419 02/11/21 0850  AST 18 46* 54*  ALT 13 33 47*  ALKPHOS 35* 57 86  BILITOT 1.0 1.1 0.7  PROT 5.2* 6.0* 5.6*  ALBUMIN 2.5* 2.7* 2.4*   No results for input(s): LIPASE, AMYLASE in the last 168 hours. No results for input(s): AMMONIA in the last 168 hours. Coagulation Profile: No results for input(s): INR, PROTIME in the last 168 hours. Cardiac Enzymes: No results for input(s): CKTOTAL, CKMB, CKMBINDEX, TROPONINI in the last 168 hours. BNP (last 3 results) No results for input(s): PROBNP in the last 8760 hours. HbA1C: No results for input(s): HGBA1C in the last 72 hours. CBG: Recent Labs  Lab 02/11/21 1439 02/11/21 1657 02/11/21 2124 02/12/21 0604 02/12/21 1222  GLUCAP 105* 114* 99 126* 101*   Lipid Profile: Recent Labs    02/11/21  0850  TRIG 124   Thyroid Function Tests: No results for input(s): TSH, T4TOTAL, FREET4, T3FREE, THYROIDAB in the last 72 hours. Anemia Panel: No results for input(s): VITAMINB12, FOLATE, FERRITIN, TIBC, IRON, RETICCTPCT in the last 72 hours. Sepsis Labs: No results for input(s): PROCALCITON, LATICACIDVEN in the last 168 hours.  Recent Results (from the past 240 hour(s))  Resp Panel by RT-PCR (Flu A&B, Covid) Nasopharyngeal Swab     Status: None   Collection Time: 02/02/21  2:50 PM   Specimen: Nasopharyngeal Swab; Nasopharyngeal(NP) swabs in vial transport medium  Result Value Ref Range Status   SARS Coronavirus 2 by RT PCR NEGATIVE NEGATIVE Final    Comment: (NOTE) SARS-CoV-2 target nucleic acids are NOT DETECTED.  The SARS-CoV-2 RNA is generally detectable in upper respiratory specimens during the acute phase of infection. The lowest concentration of SARS-CoV-2 viral copies this assay can detect is 138 copies/mL. A negative result does not preclude SARS-Cov-2 infection and should not be used as the sole basis for treatment or other patient management  decisions. A negative result may occur with  improper specimen collection/handling, submission of specimen other than nasopharyngeal swab, presence of viral mutation(s) within the areas targeted by this assay, and inadequate number of viral copies(<138 copies/mL). A negative result must be combined with clinical observations, patient history, and epidemiological information. The expected result is Negative.  Fact Sheet for Patients:  BloggerCourse.com  Fact Sheet for Healthcare Providers:  SeriousBroker.it  This test is no t yet approved or cleared by the [place] FDA and  has been authorized for detection and/or diagnosis of SARS-CoV-2 by FDA under an Emergency Use Authorization (EUA). This EUA will remain  in effect (meaning this test can be used) for the duration of the COVID-19 declaration under Section 564(b)(1) of the Act, 21 U.S.C.section 360bbb-3(b)(1), unless the authorization is terminated  or revoked sooner.       Influenza A by PCR NEGATIVE NEGATIVE Final   Influenza B by PCR NEGATIVE NEGATIVE Final    Comment: (NOTE) The Xpert Xpress SARS-CoV-2/FLU/RSV plus assay is intended as an aid in the diagnosis of influenza from Nasopharyngeal swab specimens and should not be used as a sole basis for treatment. Nasal washings and aspirates are unacceptable for Xpert Xpress SARS-CoV-2/FLU/RSV testing.  Fact Sheet for Patients: BloggerCourse.com  Fact Sheet for Healthcare Providers: SeriousBroker.it  This test is not yet approved or cleared by the [place] FDA and has been authorized for detection and/or diagnosis of SARS-CoV-2 by FDA under an Emergency Use Authorization (EUA). This EUA will remain in effect (meaning this test can be used) for the duration of the COVID-19 declaration under Section 564(b)(1) of the Act, 21 U.S.C. section 360bbb-3(b)(1), unless the  authorization is terminated or revoked.  Performed at [facility], [address] [place], [place] [address]   Gram stain     Status: None   Collection Time: 02/10/21 12:52 PM   Specimen: Fluid  Result Value Ref Range Status   Specimen Description FLUID  Final   Special Requests NONE  Final   Gram Stain   Final    CYTOSPIN SMEAR WBC SEEN NO ORGANISMS SEEN Performed at [facility], [address] [place], [place] [address]    Report Status 02/10/2021 FINAL  Final  Culture, body fluid w Gram Stain-bottle     Status: None (Preliminary result)   Collection Time: 02/10/21 12:52 PM   Specimen: Fluid  Result Value Ref Range Status  Specimen Description FLUID  Final   Special Requests NONE  Final   Culture   Final    NO GROWTH 2 DAYS Performed at [facility] [address] [place], [place] [address]    Report Status PENDING  Incomplete    Radiology Studies: DG Chest 1 View  Result Date: 02/10/2021 CLINICAL DATA:  Status post RIGHT thoracentesis. EXAM: CHEST  1 VIEW COMPARISON:  02/10/2021 and prior radiographs FINDINGS: Cardiomediastinal silhouette is unchanged. Decreased RIGHT pleural effusion noted without pneumothorax. LEFT LOWER lung consolidation/atelectasis and LEFT pleural effusion noted. A RIGHT PICC line is again identified. IMPRESSION: Decreased RIGHT pleural effusion without pneumothorax. Otherwise unchanged appearance of the chest. Electronically Signed   By: Margarette Canada M.D.   On: 02/10/2021 13:37   ECHOCARDIOGRAM COMPLETE  Result Date: 02/10/2021    ECHOCARDIOGRAM REPORT   Patient Name:   JARILYN HOLIAN Date of Exam: 02/10/2021 Medical Rec #:  [id number]          Height:       68.0 in Accession #:    [id number]         Weight:       137.8 lb Date of Birth:  04/19/1940           BSA:          1.744 m Patient Age:    17 years           BP:           130/83 mmHg Patient Gender: F                  HR:           106 bpm. Exam Location:  Inpatient  Procedure: 2D Echo, Cardiac Doppler and Color Doppler Indications:    I26.02 Pulmonary embolus  History:        Patient has prior history of Echocardiogram examinations, most                 recent 10/20/2018. Aortic Valve Disease; Risk                 Factors:Hypertension.  Sonographer:    Glo Herring Referring Phys: [phone] ERIC Paulina  1. 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 Overall, LVF appears reduced. Left ventricular endocardial border not optimally defined to evaluate regional wall motion. Left ventricular diastolic function could not be evaluated.  2. Right ventricular systolic function was not well visualized. The right ventricular size is not well visualized. There is normal pulmonary artery systolic pressure. The estimated right ventricular systolic pressure is [id number] mmHg.  3. The pericardial effusion is posterior to the left ventricle. Large pleural effusion in the left lateral region.  4. The mitral valve is normal in structure. Trivial mitral valve regurgitation. No evidence of mitral stenosis.  5. The aortic valve is tricuspid. Aortic valve regurgitation is trivial. Mild to moderate aortic valve sclerosis/calcification is present, without any evidence of aortic stenosis.  6. The inferior vena cava is dilated in size with <50% respiratory variability, suggesting right atrial pressure of 15 mmHg.  7. Recommend repeat limited study with definity contrast. 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 Left ventricular endocardial border not optimally defined to evaluate regional wall motion.  The left ventricular internal cavity size was normal in size. There is no left ventricular hypertrophy. Abnormal  (paradoxical) septal motion, consistent with left bundle branch block. Left ventricular diastolic function could not be evaluated due to atrial fibrillation. Left ventricular diastolic function could not be evaluated. Right Ventricle: The right ventricular size is not well visualized. Right vetricular wall thickness was not assessed. Right ventricular systolic function was not well visualized. There is normal pulmonary artery systolic pressure. The tricuspid regurgitant velocity is 2.22 m/s, and with an assumed right atrial pressure of 15 mmHg, the estimated right ventricular systolic pressure is [id number] mmHg. Left Atrium: Left atrial size was normal in size. Right Atrium: Right atrial size was normal in size. Pericardium: Trivial pericardial effusion is present. The pericardial effusion is posterior to the left ventricle. Mitral Valve: The mitral valve is normal in structure. Trivial mitral valve regurgitation. No evidence of mitral valve stenosis. Tricuspid Valve: The tricuspid valve is normal in structure. Tricuspid valve regurgitation is mild . No evidence of tricuspid stenosis. Aortic Valve: The aortic valve is tricuspid. Aortic valve regurgitation is trivial. Mild to moderate aortic valve sclerosis/calcification is present, without any evidence of aortic stenosis. Aortic valve mean gradient measures 2.2 mmHg. Aortic valve peak  gradient measures 4.3 mmHg. Pulmonic Valve: The pulmonic valve was normal in structure. Pulmonic valve regurgitation is not visualized. No evidence of pulmonic stenosis. Aorta: The aortic root is normal in size and structure. Venous: The inferior vena cava is dilated in size with less than 50% respiratory variability, suggesting right atrial pressure of 15 mmHg. IAS/Shunts: No atrial level shunt detected by color flow Doppler. Additional Comments: There is a large pleural effusion in the left lateral region.  LEFT VENTRICLE PLAX 2D LVIDd:         3.70 cm LVIDs:         3.00 cm LV PW:          1.20 cm LV IVS:        1.20 cm  RIGHT VENTRICLE          IVC RV Basal diam:  3.00 cm  IVC diam: 2.90 cm LEFT ATRIUM             Index        RIGHT ATRIUM           Index LA diam:        2.80 cm 1.61 cm/m   RA Area:     13.40 cm LA Vol (A2C):   45.5 ml 26.08 ml/m  RA Volume:   27.90 ml  15.99 ml/m LA Vol (A4C):   47.3 ml 27.11 ml/m LA Biplane Vol: 47.8 ml 27.40 ml/m  AORTIC VALVE AV Vmax:           104.18 cm/s AV Vmean:          73.475 cm/s AV VTI:            0.142 m AV Peak Grad:      4.3 mmHg AV Mean Grad:      2.2 mmHg LVOT Vmax:         61.37 cm/s LVOT Vmean:        40.033 cm/s LVOT VTI:          0.081 m LVOT/AV VTI ratio: 0.57  AORTA Ao Root diam: 2.80 cm Ao Asc diam:  2.30 cm TRICUSPID VALVE TR Peak grad:   19.7 mmHg TR Vmax:        222.00 cm/s  SHUNTS Systemic VTI: 0.08 m Fransico Him MD Electronically signed by Fransico Him MD Signature Date/Time: 02/10/2021/2:54:46 PM    Final    US THORACENTESIS ASP PLEURAL SPACE W/IMG GUIDE  Result Date: 02/10/2021 INDICATION: Patient has small bilateral PEs as well as bilateral pleural effusions. Patient also has fluid overload from CHF. Request for diagnostic and therapeutic thoracentesis. EXAM: ULTRASOUND GUIDED DIAGNOSTIC AND THERAPEUTIC THORACENTESIS MEDICATIONS: 50ml 1% lidocaine COMPLICATIONS: None immediate. PROCEDURE: An ultrasound guided thoracentesis was thoroughly discussed with the patient and questions answered. The benefits, risks, alternatives and complications were also discussed. The patient understands and wishes to proceed with the procedure. Written consent was obtained. Ultrasound was performed to localize and mark an adequate pocket of fluid in the right chest. The area was then prepped and draped in the normal sterile fashion. 1% Lidocaine was used for local anesthesia. Under ultrasound guidance a 6 Fr Safe-T-Centesis catheter was introduced. Thoracentesis was performed. The catheter was removed and a dressing applied. FINDINGS: A total  of approximately 850 cc of hazy, yellow fluid was removed. Samples were sent to the laboratory as requested by the clinical team. IMPRESSION: Successful ultrasound guided right thoracentesis yielding 850 cc of pleural fluid. Read by: Narda Rutherford, NP Electronically Signed   By: Albin Felling M.D.   On: 02/10/2021 12:53    Scheduled Meds:  acetaminophen  1,000 mg Oral Q6H   apixaban  10 mg Oral BID   Followed by   Derrill Memo ON 02/17/2021] apixaban  5 mg Oral BID   Chlorhexidine Gluconate Cloth  6 each Topical Daily   famotidine  20 mg Oral Daily   feeding supplement  237 mL Oral TID BM   furosemide  40 mg Intravenous BID   lidocaine  1 patch Transdermal Q24H   mouth rinse  15 mL Mouth Rinse BID   melatonin  3 mg Oral QHS   sodium chloride flush  10-40 mL Intracatheter Q12H   Continuous Infusions:  sodium chloride Stopped (02/10/21 0429)   methocarbamol (ROBAXIN) IV 500 mg (02/12/21 0515)   TPN ADULT (ION) 30 mL/hr at 02/12/21 0153     LOS: 10 days    Time spent: 25 mins    Shelsey Rieth, MD [facility]   If 7PM-7AM, please contact night-coverage

## 2021-02-12 NOTE — Progress Notes (Signed)
Mobility Specialist: Progress Note   02/12/21 1706  Mobility  Activity Ambulated in hall  Level of Assistance Modified independent, requires aide device or extra time  Assistive Device  (IV Pole)  Distance Ambulated (ft) 450 ft  Mobility Ambulated with assistance in hallway  Mobility Response Tolerated well  Mobility performed by Mobility specialist  $Mobility charge 1 Mobility   Pre-Mobility: 95 HR, 98% SpO2 During Mobility: 85 HR, 100% SpO2  Pt independent to sit EOB as well as to stand. Pt c/o feeling SOB during ambulation, O2 saturations normal throughout. Pt back to bed after walk with call bell and phone at her side.   [facility] Shawnn Bouillon Mobility Specialist Mobility Specialist Phone: [phone]

## 2021-02-13 DIAGNOSIS — I4892 Unspecified atrial flutter: Secondary | ICD-10-CM

## 2021-02-13 DIAGNOSIS — E871 Hypo-osmolality and hyponatremia: Secondary | ICD-10-CM

## 2021-02-13 DIAGNOSIS — J90 Pleural effusion, not elsewhere classified: Secondary | ICD-10-CM

## 2021-02-13 DIAGNOSIS — E876 Hypokalemia: Secondary | ICD-10-CM

## 2021-02-13 DIAGNOSIS — E8770 Fluid overload, unspecified: Secondary | ICD-10-CM

## 2021-02-13 DIAGNOSIS — I483 Typical atrial flutter: Secondary | ICD-10-CM

## 2021-02-13 LAB — CBC
HCT: 32.9 % — ABNORMAL LOW (ref 36.0–46.0)
Hemoglobin: 11 g/dL — ABNORMAL LOW (ref 12.0–15.0)
MCH: 32.2 pg (ref 26.0–34.0)
MCHC: 33.4 g/dL (ref 30.0–36.0)
MCV: 96.2 fL (ref 80.0–100.0)
Platelets: 310 10*3/uL (ref 150–400)
RBC: 3.42 MIL/uL — ABNORMAL LOW (ref 3.87–5.11)
RDW: 13 % (ref 11.5–15.5)
WBC: 11.3 10*3/uL — ABNORMAL HIGH (ref 4.0–10.5)
nRBC: 0 % (ref 0.0–0.2)

## 2021-02-13 LAB — BASIC METABOLIC PANEL
Anion gap: 8 (ref 5–15)
BUN: 32 mg/dL — ABNORMAL HIGH (ref 8–23)
CO2: 27 mmol/L (ref 22–32)
Calcium: 8.3 mg/dL — ABNORMAL LOW (ref 8.9–10.3)
Chloride: 102 mmol/L (ref 98–111)
Creatinine, Ser: 0.59 mg/dL (ref 0.44–1.00)
GFR, Estimated: >60 mL/min (ref >60)
Glucose, Bld: 100 mg/dL — ABNORMAL HIGH (ref 70–99)
Potassium: 4 mmol/L (ref 3.5–5.1)
Sodium: 137 mmol/L (ref 135–145)

## 2021-02-13 MED ORDER — METHOCARBAMOL 500 MG PO TABS: 500.0000 mg | ORAL_TABLET | Freq: Three times a day (TID) | ORAL | 0 refills | Status: DC | PRN

## 2021-02-13 MED ORDER — LISINOPRIL 20 MG PO TABS: 20.0000 mg | ORAL_TABLET | Freq: Every day | ORAL | Status: DC

## 2021-02-13 MED ORDER — APIXABAN 5 MG PO TABS: ORAL_TABLET | ORAL | 0 refills | Status: DC

## 2021-02-13 MED ORDER — FAMOTIDINE 20 MG PO TABS: 20.0000 mg | ORAL_TABLET | Freq: Every day | ORAL | 0 refills | Status: DC

## 2021-02-13 MED ORDER — ENSURE ENLIVE PO LIQD: 237.0000 mL | Freq: Three times a day (TID) | ORAL | 0 refills | Status: AC

## 2021-02-13 MED ORDER — AMLODIPINE BESYLATE 5 MG PO TABS: 5.0000 mg | ORAL_TABLET | Freq: Every day | ORAL | Status: DC

## 2021-02-13 MED ORDER — METHOCARBAMOL 500 MG PO TABS
500.0000 mg | ORAL_TABLET | Freq: Three times a day (TID) | ORAL | Status: DC | PRN
Start: 2021-02-13 — End: 2021-02-13

## 2021-02-13 NOTE — Care Management (Signed)
Patient provided with Eliquis 30 day card. Explained use to patient, spouse, and son at bedside. No other TOC needs identified for DC to home.

## 2021-02-13 NOTE — Progress Notes (Signed)
Occupational Therapy Treatment and Discharge Patient Details Name: Dana Hall MRN: [id number] DOB: [birth year] Today's Date: 02/13/2021   History of present illness Pt is an 80 y.o. female admitted on 02/02/21 with new onset abdominal pain; S/p Exploratory laparotomy, lysis of adhesions on 10/24. C/o SOB on 10/29 due to PE and pleural effusions. S/p R thoracentesis 10/30. PMH includes: SBO status post partial small bowel resection 2013, HTN.   OT comments  Pt is functioning modified independently in ADL and mobility. No further OT needs. Pt eager to go home.    Recommendations for follow up therapy are one component of a multi-disciplinary discharge planning process, led by the attending physician.  Recommendations may be updated based on patient status, additional functional criteria and insurance authorization.    Follow Up Recommendations  No OT follow up    Assistance Recommended at Discharge Intermittent Supervision/Assistance  Equipment Recommendations  None recommended by OT    Recommendations for Other Services      Precautions / Restrictions Precautions Precautions: Fall       Mobility Bed Mobility Overal bed mobility: Modified Independent             General bed mobility comments: HOB up slightly    Transfers Overall transfer level: Independent Equipment used: Rolling walker (2 wheels)                     Balance Overall balance assessment: No apparent balance deficits (not formally assessed)           Standing balance-Leahy Scale: Good                             ADL either performed or assessed with clinical judgement   ADL Overall ADL's : Modified independent                                             Vision       Perception     Praxis      Cognition Arousal/Alertness: Awake/alert Behavior During Therapy: WFL for tasks assessed/performed Overall Cognitive Status: Within Functional Limits  for tasks assessed                                            Exercises     Shoulder Instructions       General Comments      Pertinent Vitals/ Pain       Pain Assessment: Faces  Home Living                                          Prior Functioning/Environment              Frequency           Progress Toward Goals  OT Goals(current goals can now be found in the care plan section)  Progress towards OT goals: Goals met/education completed, patient discharged from OT  Acute Rehab OT Goals OT Goal Formulation: With patient Time For Goal Achievement: 02/25/21 Potential to Achieve Goals: Good  Plan All goals met and education completed, patient discharged from OT  services    Co-evaluation                 AM-PAC OT "6 Clicks" Daily Activity     Outcome Measure   Help from another person eating meals?: None Help from another person taking care of personal grooming?: None Help from another person toileting, which includes using toliet, bedpan, or urinal?: None Help from another person bathing (including washing, rinsing, drying)?: None Help from another person to put on and taking off regular upper body clothing?: None Help from another person to put on and taking off regular lower body clothing?: None 6 Click Score: 24    End of Session Equipment Utilized During Treatment: Rolling walker (2 wheels)  OT Visit Diagnosis: Unsteadiness on feet (R26.81);Other abnormalities of gait and mobility (R26.89);Pain;Muscle weakness (generalized) (M62.81)   Activity Tolerance Patient tolerated treatment well   Patient Left in bed;with call bell/phone within reach;with bed alarm set   Nurse Communication          Time: 7026-3785 OT Time Calculation (min): 18 min  Charges: OT General Charges $OT Visit: 1 Visit OT Treatments $Self Care/Home Management : 8-22 mins  Nestor Lewandowsky, OTR/L Acute Rehabilitation  Services Pager: [phone] Office: [phone]   Malka So 02/13/2021, 2:22 PM

## 2021-02-13 NOTE — Discharge Instructions (Signed)
Information on my medicine - ELIQUIS (apixaban)  This medication education was reviewed with me or my healthcare representative as part of my discharge preparation.    Why was Eliquis prescribed for you? 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  What do You need to know about Eliquis ? The starting dose is 10 mg (two 5 mg tablets) taken TWICE daily for the FIRST SEVEN (7) DAYS, then on 02/17/21 in the evening  the dose is reduced to ONE 5 mg tablet taken TWICE daily.  Eliquis may be taken with or without food.   Try to take the dose about the same time in the morning and in the evening. If you have difficulty swallowing the tablet whole please discuss with your pharmacist how to take the medication safely.  Take Eliquis exactly as prescribed and DO NOT stop taking Eliquis without talking to the doctor who prescribed the medication.  Stopping may increase your risk of developing a new blood clot.  Refill your prescription before you run out.  After discharge, you should have regular check-up appointments with your healthcare provider that is prescribing your Eliquis.    What do you do if you miss a dose? If a dose of ELIQUIS is not taken at the scheduled time, take it as soon as possible on the same day and twice-daily administration should be resumed. The dose should not be doubled to make up for a missed dose.  Important Safety Information A possible side effect of Eliquis is bleeding. You should call your healthcare provider right away if you experience any of the following: Bleeding from an injury or your nose that does not stop. Unusual colored urine (red or dark brown) or unusual colored stools (red or black). Unusual bruising for unknown reasons. A serious fall or if you hit your head (even if there is no  bleeding).  Some medicines may interact with Eliquis and might increase your risk of bleeding or clotting while on Eliquis. To help avoid this, consult your healthcare provider or pharmacist prior to using any new prescription or non-prescription medications, including herbals, vitamins, non-steroidal anti-inflammatory drugs (NSAIDs) and supplements.  This website has more information on Eliquis (apixaban): http://[email or web address].[email or web address]/home

## 2021-02-13 NOTE — Discharge Summary (Addendum)
Physician Discharge Summary  Dana Hall [id number] DOB: Aug 12, 1940 DOA: 02/02/2021  PCP: Donald Prose, MD  Admit date: 02/02/2021 Discharge date: 02/13/2021  Admitted From: Home  Disposition:  Home   Recommendations for Outpatient Follow-up and new medication changes:  Follow up with Dr. Nancy Fetter in 7 to 10 days.  Follow outpatient echocardiography Pain control with methocarbamol Follow up with surgery as scheduled. Patient placed on apixaban for anticoagulation.   I spoke over the phone with the patient's daughter about patient's  condition, plan of care, prognosis and all questions were addressed.   Home Health: na   Equipment/Devices: na    Discharge Condition: stable  CODE STATUS: full  Diet recommendation:  heart healthy   Brief/Interim Summary: Dana Hall was admitted to the hospital with the working diagnosis of small bowel obstruction.   80 year old female with past medical history for hypertension and history of small bowel obstruction in the past, status post small bowel resection who presented with abdominal pain.  Reported colicky lower abdominal pain, associated with nausea and vomiting.  Because of severe symptoms she was seen by her primary care physician who referred her to the hospital for further evaluation.  On her initial physical examination blood pressure 153/71, heart rate 80, respiratory rate 21, oxygen saturation 99%, she had dry mucous membranes, her lungs were clear to auscultation bilaterally, heart S1-S2, present, rhythmic, her abdomen was distended, diffusely tender to palpation, decreased bowel sounds, no lower extremity edema.  Sodium 132, potassium 4.8, chloride 93, bicarb 28, glucose 163, BUN 22, creatinine 0.78, lipase 48, AST 29, ALT 25, total bilirubin is 1.3, white count 19.3, hemoglobin 15.0, hematocrit 45.1, platelets 341. SARS COVID-19 negative.  Urine analysis specific gravity 1.044, 30 protein, 0-5 red cells, 0-5 white cells.  CT of  the abdomen and pelvis with high-grade distal small bowel obstruction, transition point in the mid pelvis.  No associated mass or lesion evident.   Patient was admitted to the medical ward, she had a nasogastric tube placed for decompression. Unfortunately she failed nonmedical supportive care and underwent expiratory laparotomy with lysis of adhesions on 02/04/2021.  10/30 patient developed acute volume overload with acute hypoxemic respiratory failure, she was diagnosed with bilateral pleural effusions. New onset atrial flutter with variable block, heart rate 101 bpm. Patient was placed on heparin drip.  Underwent right-sided ultrasound-guided thoracentesis on 10/30, obtaining 850 mL of hazy yellow fluid.  Her condition slowly improved, her diet was advanced with good toleration.  Small bowel obstruction.  Patient had a prolonged hospitalization, initially placed on supportive medical therapy including nasogastric tube with intermittent suction, intravenous fluids, as needed analgesics and antiemetics.  10/24 she had no evidence of contrast on her follow-up film from second episode of small bowel obstruction.  Her abdomen was quite distended and tender to palpation.  Surgery recommended exploratory laparotomy with lysis of adhesions.   10/24 patient underwent expiratory laparotomy with lysis of adhesions  for 80 minutes.  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 Patient developed postoperative ileus.  Treated with nasogastric tube to low intermittent suction and close supportive medical therapy.  Ileus slowly resolved, her diet was advanced with good toleration.  2.  Acute hypoxemic respiratory failure due to volume overload, bilateral pleural effusions. 10/30 patient had acute decompensation with severe acute hypoxic respiratory failure, likely due to volume overload, acute diastolic heart failure exacerbation. Patient  received intravenous furosemide and underwent right-sided ultrasound-guided thoracentesis.  Her volume status and oxygenation improved.  Discharge oxygenation is 97% on room air.  3.  New onset atrial flutter/atrial fibrillation.  Electrocardiogram from 10/30 showed rate of 101 bpm, left axis deviation, QTC 500, atrial flutter rhythm, 3-1/2-1 variable block, Q-wave in V1-V5, ST elevation V1-V5, negative T wave lead I/aVL. Patient underwent echocardiography which had poor windows unable to quantify ejection fraction left ventricle.  No significant valvulopathy.  At the time of her discharge her heart rate is 80-87, converted back to sinus rhythm.  Plan to follow-up echocardiography as an outpatient.  4.  Bilateral pulmonary embolism. On 10/29 patient underwent CT chest, showed small bilateral pulm emboli, no large central pulmonary emboli and no findings of right heart strain.  Large bilateral effusions with significant overlying atelectasis. Patient was placed on apixaban for anticoagulation.  5.  Hyponatremia/hypokalemia. With supportive medical therapy, her electrolytes were corrected.  At discharge sodium 137, potassium 4.0, chloride 102, bicarb 27, glucose 100, BUN 32 and creatinine 0.59.  6. Hypertension. At discharge patient will resume antihypertensive regimen with amlodipine and lisinopril.  Resume when blood pressure 140/90 mmHg.   Discharge Diagnoses:  Principal Problem:   SBO (small bowel obstruction) (HCC) Active Problems:   Hypertension   Pleural effusion   Volume overload   Atrial flutter (HCC)   Hypokalemia   Hyponatremia    Discharge Instructions   Allergies as of 02/13/2021       Reactions   Benadryl [diphenhydramine] Anxiety   Made me crazy        Medication List     TAKE these medications    acetaminophen 500 MG tablet Commonly known as: TYLENOL Take 500 mg by mouth every 6 (six) hours as needed for mild pain.   amLODipine 5 MG tablet Commonly  known as: NORVASC Take 1 tablet (5 mg total) by mouth at bedtime. Please resume taking when blood pressure 140/90 or greater What changed: additional instructions   apixaban 5 MG Tabs tablet Commonly known as: ELIQUIS Take 2 tablets twice daily until 02/16/21, then on 02/17/21 start taking take 1 tablet twice daily   famotidine 20 MG tablet Commonly known as: PEPCID Take 1 tablet (20 mg total) by mouth daily. Start taking on: February 14, 2021   feeding supplement Liqd Take 237 mLs by mouth 3 (three) times daily between meals.   lisinopril 20 MG tablet Commonly known as: ZESTRIL Take 1 tablet (20 mg total) by mouth at bedtime. Please resume taking when blood pressure 140/90 or greater. What changed: additional instructions   methocarbamol 500 MG tablet Commonly known as: ROBAXIN Take 1 tablet (500 mg total) by mouth every 8 (eight) hours as needed for muscle spasms.        Follow-up [facility], [place]. Go on 02/20/2021.   Specialty: General Surgery Why: Your appointment is 11/9 at 10am for staple removal Please arrive 30 minutes prior to your appointment to check in and fill out paperwork. Bring photo ID and insurance information. Contact information: [place] [phone]        Clovis Riley, MD. Go on 03/05/2021.   Specialty: General Surgery Why: Your appointment is 11/22 at 3pm Please arrive 15 minutes early to check in. Contact information: [address] [place] [address] [phone]                Allergies  Allergen Reactions   Benadryl [Diphenhydramine] Anxiety  Made me crazy    Consultations: Surgery  IR   Procedures/Studies: DG Chest 1 View  Result Date: 02/10/2021 CLINICAL DATA:  Status post RIGHT thoracentesis. EXAM: CHEST  1 VIEW COMPARISON:  02/10/2021 and prior radiographs FINDINGS: Cardiomediastinal silhouette is  unchanged. Decreased RIGHT pleural effusion noted without pneumothorax. LEFT LOWER lung consolidation/atelectasis and LEFT pleural effusion noted. A RIGHT PICC line is again identified. IMPRESSION: Decreased RIGHT pleural effusion without pneumothorax. Otherwise unchanged appearance of the chest. Electronically Signed   By: Harmon Pier M.D.   On: 02/10/2021 13:37   DG Abd 1 View  Result Date: 02/04/2021 CLINICAL DATA:  Nausea, vomiting. EXAM: ABDOMEN - 1 VIEW COMPARISON:  February 03, 2021. FINDINGS: Nasogastric tube tip is seen in proximal stomach. Mildly dilated small bowel loops are noted concerning for ileus or possibly distal small bowel obstruction. No colonic dilatation is noted. IMPRESSION: Mildly dilated small bowel loops are noted concerning for ileus or possibly distal small bowel obstruction. Electronically Signed   By: Lupita Raider M.D.   On: 02/04/2021 08:28   CT Angio Chest Pulmonary Embolism (PE) W or WO Contrast  Result Date: 02/09/2021 CLINICAL DATA:  Chest pain shortness of breath. Recent bowel surgery. EXAM: CT ANGIOGRAPHY CHEST WITH CONTRAST TECHNIQUE: Multidetector CT imaging of the chest was performed using the standard protocol during bolus administration of intravenous contrast. Multiplanar CT image reconstructions and MIPs were obtained to evaluate the vascular anatomy. CONTRAST:  65mL OMNIPAQUE IOHEXOL 350 MG/ML SOLN COMPARISON:  None. FINDINGS: Cardiovascular: The heart is normal in size. No pericardial effusion. There is tortuosity, ectasia and calcification of thoracic aorta. No aneurysm or dissection. The pulmonary arterial tree is well opacified. Small bilateral pulmonary emboli are demonstrated most notably in the right upper lobe. No large central pulmonary emboli and no findings for right heart strain. Mediastinum/Nodes: No mediastinal or hilar mass or lymphadenopathy. The esophagus is grossly normal. Lungs/Pleura: Large bilateral pleural effusions with significant  overlying atelectasis. Patchy ground-glass opacity could reflect asymmetric pulmonary edema. No pneumothorax. Upper Abdomen: No significant upper abdominal findings. Musculoskeletal: No breast masses, supraclavicular or axillary adenopathy. The bony thorax is intact. Review of the MIP images confirms the above findings. IMPRESSION: 1. Small bilateral pulmonary emboli. No large central pulmonary emboli and no findings for right heart strain. 2. Large bilateral pleural effusions with significant overlying atelectasis. 3. Patchy ground-glass opacity could reflect asymmetric pulmonary edema. 4. Aortic atherosclerosis. These results will be called to the ordering clinician or representative by the Radiologist Assistant, and communication documented in the PACS or [employer]. Aortic Atherosclerosis (ICD10-I70.0).  Lymph lymph lymph lymph Electronically Signed   By: Rudie Meyer M.D.   On: 02/09/2021 11:07   CT Abdomen Pelvis W Contrast  Result Date: 02/02/2021 CLINICAL DATA:  Abdominal pain, tenderness EXAM: CT ABDOMEN AND PELVIS WITH CONTRAST TECHNIQUE: Multidetector CT imaging of the abdomen and pelvis was performed using the standard protocol following bolus administration of intravenous contrast. CONTRAST:  OMNIPAQUE IOHEXOL 300 MG/ML  SOLN COMPARISON:  04/10/2020 FINDINGS: Lower chest: No pleural or pericardial effusion. Fluid in the nondilated distal esophagus. Visualized lung bases normal. Hepatobiliary: No focal liver abnormality is seen. No gallstones, gallbladder wall thickening, or biliary dilatation. Pancreas: Unremarkable. No pancreatic ductal dilatation or surrounding inflammatory changes. Spleen: Normal in size without focal abnormality. Adrenals/Urinary Tract: Normal adrenal glands. No urolithiasis hydronephrosis. Left parapelvic renal cysts as before. Urinary bladder nondistended. Stomach/Bowel: Gastric body and fundus distended by fluid. Duodenal periampullary diverticulum. Multiple  dilated proximal  and mid small bowel loops. Transition point in the pelvis. Distal ileal loops are decompressed through the terminal ileum. The colon is nondistended, unremarkable. Vascular/Lymphatic: Mild calcified aortic plaque without aneurysm. No abdominal or pelvic adenopathy. Reproductive: Uterus and bilateral adnexa are unremarkable. Other: Bilateral pelvic phleboliths.  No ascites.  No free air. Musculoskeletal: Spondylitic changes in the lower lumbar spine. No fracture or worrisome bone lesion. IMPRESSION: 1. High-grade distal small bowel obstruction, transition point in the mid pelvis. No associated mass or lesion is evident, suggesting adhesions. 2.  Aortic Atherosclerosis (ICD10-170.0). Electronically Signed   By: Lucrezia Europe M.D.   On: 02/02/2021 13:58   DG CHEST PORT 1 VIEW  Result Date: 02/10/2021 CLINICAL DATA:  Acute respiratory failure with hypoxia EXAM: PORTABLE CHEST 1 VIEW COMPARISON:  06/01/2007 chest radiograph.  Chest CT from yesterday FINDINGS: Diffuse interstitial opacity and radiographically moderate pleural effusions. No pneumothorax. Partially obscured heart size which is normal. Right PICC with tip at the right atrium. IMPRESSION: CHF including bilateral pleural effusion Electronically Signed   By: Jorje Guild M.D.   On: 02/10/2021 04:21   DG Abd Portable 1V-Small Bowel Obstruction Protocol-initial, 8 hr delay  Result Date: 02/03/2021 CLINICAL DATA:  Small-bowel obstruction EXAM: PORTABLE ABDOMEN - 1 VIEW COMPARISON:  Earlier same day.  02/02/2021. FINDINGS: Nasogastric tube remains in the stomach. Contrast fills the fundus of the stomach. Previously administered contrast shows moderate progression throughout dilated small bowel. Contrast has not yet reached the colon. IMPRESSION: Administered contrast shows moderate progression through dilated small bowel but has not yet reached the colon. Electronically Signed   By: Nelson Chimes M.D.   On: 02/03/2021 17:16   DG Abd  Portable 1V-Small Bowel Protocol-Position Verification  Result Date: 02/03/2021 CLINICAL DATA:  80 year old female with nasogastric tube in place. EXAM: PORTABLE ABDOMEN - 1 VIEW COMPARISON:  Earlier the same day FINDINGS: Gastric decompression tube has a redundant course, coiled in the gastric body with the tip near the gastroesophageal junction, similar to comparison. Scattered gaseous containing loops of small bowel are again visualized without significant distension. The colon is relatively decompressed. IMPRESSION: Indwelling gastric decompression tube remains coiled in the gastric body. Nonspecific bowel gas pattern with persistent scattered loops of gas-filled small bowel. Electronically Signed   By: Ruthann Cancer M.D.   On: 02/03/2021 11:05   DG Abd Portable 1V-Small Bowel Obstruction Protocol-initial, 8 hr delay  Result Date: 02/03/2021 CLINICAL DATA:  Small-bowel obstruction.  8 hour delayed film. EXAM: PORTABLE ABDOMEN - 1 VIEW COMPARISON:  Abdominal radiograph dated 02/02/2021. FINDINGS: Similar positioning of the enteric tube. Persistent dilatation of small-bowel loops measuring approximately 3.3 cm in the left lower quadrant. Excreted contrast noted in the urinary bladder. Degenerative changes of the spine. No acute osseous pathology. IMPRESSION: Persistent dilatation of small-bowel loops in the left lower quadrant. Electronically Signed   By: Anner Crete M.D.   On: 02/03/2021 00:53   DG Abd Portable 1V-Small Bowel Protocol-Position Verification  Result Date: 02/02/2021 CLINICAL DATA:  Abdominal pain EXAM: PORTABLE ABDOMEN - 1 VIEW COMPARISON:  X-ray abdomen 11/20/2011, CT abdomen pelvis 02/02/2021 FINDINGS: Enteric tube noted coursing below the hemidiaphragm with tip and side port overlying the expected region of the gastric lumen. Excreted intravenous contrast noted within bilateral collecting systems. Some distended small bowel loops are noted in the left mid abdomen but not well  visualized. IMPRESSION: Enteric tube in good position.  Could consider retraction by 5 cm. Electronically Signed   By: Clelia Croft.D.  On: 02/02/2021 16:12   ECHOCARDIOGRAM COMPLETE  Result Date: 02/10/2021    ECHOCARDIOGRAM REPORT   Patient Name:   LORY GREISER Date of Exam: 02/10/2021 Medical Rec #:  [id number]          Height:       68.0 in Accession #:    [id number]         Weight:       137.8 lb Date of Birth:  03/20/1941           BSA:          1.744 m Patient Age:    52 years           BP:           130/83 mmHg Patient Gender: F                  HR:           106 bpm. Exam Location:  Inpatient Procedure: 2D Echo, Cardiac Doppler and Color Doppler Indications:    I26.02 Pulmonary embolus  History:        Patient has prior history of Echocardiogram examinations, most                 recent 10/20/2018. Aortic Valve Disease; Risk                 Factors:Hypertension.  Sonographer:    Glo Herring Referring Phys: [phone] ERIC [place]  1. 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 Overall, LVF appears reduced. Left ventricular endocardial border not optimally defined to evaluate regional wall motion. Left ventricular diastolic function could not be evaluated.  2. Right ventricular systolic function was not well visualized. The right ventricular size is not well visualized. There is normal pulmonary artery systolic pressure. The estimated right ventricular systolic pressure is [id number] mmHg.  3. The pericardial effusion is posterior to the left ventricle. Large pleural effusion in the left lateral region.  4. The mitral valve is normal in structure. Trivial mitral valve regurgitation. No evidence of mitral stenosis.  5. The aortic valve is tricuspid. Aortic valve regurgitation is trivial. Mild to moderate aortic valve sclerosis/calcification is present, without any  evidence of aortic stenosis.  6. The inferior vena cava is dilated in size with <50% respiratory variability, suggesting right atrial pressure of 15 mmHg.  7. Recommend repeat limited study with definity contrast. 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 Left ventricular endocardial border not optimally defined to evaluate regional wall motion. The left ventricular internal cavity size was normal in size. There is no left ventricular hypertrophy. Abnormal (paradoxical) septal motion, consistent with left bundle branch block. Left ventricular diastolic function could not be evaluated due to atrial fibrillation. Left ventricular diastolic function could not be evaluated. Right Ventricle: The right ventricular size is not well visualized. Right vetricular wall thickness was not assessed. Right ventricular systolic function was not well visualized. There is normal pulmonary artery systolic pressure. The tricuspid regurgitant velocity is 2.22 m/s, and with an assumed right atrial pressure of 15 mmHg, the estimated right ventricular systolic pressure is [id number] mmHg. Left Atrium: Left atrial size was normal in size. Right Atrium: Right atrial size was normal in size. Pericardium: Trivial pericardial effusion is present. The  pericardial effusion is posterior to the left ventricle. Mitral Valve: The mitral valve is normal in structure. Trivial mitral valve regurgitation. No evidence of mitral valve stenosis. Tricuspid Valve: The tricuspid valve is normal in structure. Tricuspid valve regurgitation is mild . No evidence of tricuspid stenosis. Aortic Valve: The aortic valve is tricuspid. Aortic valve regurgitation is trivial. Mild to moderate aortic valve sclerosis/calcification is present, without any evidence of aortic stenosis. Aortic valve mean gradient measures 2.2 mmHg.  Aortic valve peak  gradient measures 4.3 mmHg. Pulmonic Valve: The pulmonic valve was normal in structure. Pulmonic valve regurgitation is not visualized. No evidence of pulmonic stenosis. Aorta: The aortic root is normal in size and structure. Venous: The inferior vena cava is dilated in size with less than 50% respiratory variability, suggesting right atrial pressure of 15 mmHg. IAS/Shunts: No atrial level shunt detected by color flow Doppler. Additional Comments: There is a large pleural effusion in the left lateral region.  LEFT VENTRICLE PLAX 2D LVIDd:         3.70 cm LVIDs:         3.00 cm LV PW:         1.20 cm LV IVS:        1.20 cm  RIGHT VENTRICLE          IVC RV Basal diam:  3.00 cm  IVC diam: 2.90 cm LEFT ATRIUM             Index        RIGHT ATRIUM           Index LA diam:        2.80 cm 1.61 cm/m   RA Area:     13.40 cm LA Vol (A2C):   45.5 ml 26.08 ml/m  RA Volume:   27.90 ml  15.99 ml/m LA Vol (A4C):   47.3 ml 27.11 ml/m LA Biplane Vol: 47.8 ml 27.40 ml/m  AORTIC VALVE AV Vmax:           104.18 cm/s AV Vmean:          73.475 cm/s AV VTI:            0.142 m AV Peak Grad:      4.3 mmHg AV Mean Grad:      2.2 mmHg LVOT Vmax:         61.37 cm/s LVOT Vmean:        40.033 cm/s LVOT VTI:          0.081 m LVOT/AV VTI ratio: 0.57  AORTA Ao Root diam: 2.80 cm Ao Asc diam:  2.30 cm TRICUSPID VALVE TR Peak grad:   19.7 mmHg TR Vmax:        222.00 cm/s  SHUNTS Systemic VTI: 0.08 m Fransico Him MD Electronically signed by Fransico Him MD Signature Date/Time: 02/10/2021/2:54:46 PM    Final    [place] EKG SITE RITE  Result Date: 02/08/2021 If Site Rite image not attached, placement could not be confirmed due to current cardiac rhythm.  US THORACENTESIS ASP PLEURAL SPACE W/IMG GUIDE  Result Date: 02/10/2021 INDICATION: Patient has small bilateral PEs as well as bilateral pleural effusions. Patient also has fluid overload from CHF. Request for diagnostic and therapeutic thoracentesis. EXAM: ULTRASOUND  GUIDED DIAGNOSTIC AND THERAPEUTIC THORACENTESIS MEDICATIONS: 25ml 1% lidocaine COMPLICATIONS: None immediate. PROCEDURE: An ultrasound guided thoracentesis was thoroughly discussed with the patient and questions answered. The benefits, risks, alternatives and complications were also discussed. The patient understands and wishes to  proceed with the procedure. Written consent was obtained. Ultrasound was performed to localize and mark an adequate pocket of fluid in the right chest. The area was then prepped and draped in the normal sterile fashion. 1% Lidocaine was used for local anesthesia. Under ultrasound guidance a 6 Fr Safe-T-Centesis catheter was introduced. Thoracentesis was performed. The catheter was removed and a dressing applied. FINDINGS: A total of approximately 850 cc of hazy, yellow fluid was removed. Samples were sent to the laboratory as requested by the clinical team. IMPRESSION: Successful ultrasound guided right thoracentesis yielding 850 cc of pleural fluid. Read by: Narda Rutherford, NP Electronically Signed   By: Albin Felling M.D.   On: 02/10/2021 12:53     Procedures: exploratory laparotomy   Subjective: Patient with no nausea or vomiting, no chest pain, dyspnea is improving, no palpitations.   Discharge Exam: Vitals:   02/12/21 2333 02/13/21 0343  BP: 120/60 126/76  Pulse: 88 80  Resp: 20 20  Temp: 98.2 F (36.8 C) 98.2 F (36.8 C)  SpO2: 93% 95%   Vitals:   02/12/21 1532 02/12/21 1939 02/12/21 2333 02/13/21 0343  BP: 131/65 (!) 111/50 120/60 126/76  Pulse: 94 89 88 80  Resp: 18 20 20 20   Temp: 98.3 F (36.8 C) 98.2 F (36.8 C) 98.2 F (36.8 C) 98.2 F (36.8 C)  TempSrc: Oral Oral Oral Oral  SpO2: 97% 95% 93% 95%  Weight:    57.4 kg  Height:        General: Not in pain or dyspnea  Neurology: Awake and alert, non focal  E ENT: no pallor, no icterus, oral mucosa moist Cardiovascular: No JVD. S1-S2 present, rhythmic, no gallops, rubs, or murmurs. No lower  extremity edema. Pulmonary: soft breath sounds bilaterally, adequate air movement, no wheezing, rhonchi or rales. Gastrointestinal. Abdomen soft and non tender Skin. No rashes Musculoskeletal: no joint deformities   The results of significant diagnostics from this hospitalization (including imaging, microbiology, ancillary and laboratory) are listed below for reference.     Microbiology: Recent Results (from the past 240 hour(s))  Gram stain     Status: None   Collection Time: 02/10/21 12:52 PM   Specimen: Fluid  Result Value Ref Range Status   Specimen Description FLUID  Final   Special Requests NONE  Final   Gram Stain   Final    CYTOSPIN SMEAR WBC SEEN NO ORGANISMS SEEN Performed at [facility], [address] [place], [place] [address]    Report Status 02/10/2021 FINAL  Final  Culture, body fluid w Gram Stain-bottle     Status: None (Preliminary result)   Collection Time: 02/10/21 12:52 PM   Specimen: Fluid  Result Value Ref Range Status   Specimen Description FLUID  Final   Special Requests NONE  Final   Culture   Final    NO GROWTH 3 DAYS Performed at [facility] [address], [place],  [address]    Report Status PENDING  Incomplete     Labs: BNP (last 3 results) Recent Labs    02/09/21 1255  BNP [id number]*   Basic Metabolic Panel: Recent Labs  Lab 02/08/21 0215 02/08/21 0217 02/09/21 0419 02/09/21 1830 02/10/21 0430 02/11/21 0850 02/12/21 0700 02/13/21 0630  NA  --    < > 136  --  138 137 135 137  K  --    < > 3.3* 3.7 4.1 3.2* 3.8 4.0  CL  --    < >  99  --  99 99 101 102  CO2  --    < > 26  --  28 26 27 27   GLUCOSE  --    < > 134*  --  130* 182* 120* 100*  BUN  --    < > 8  --  21 29* 28* 32*  CREATININE  --    < > 0.42*  --  0.54 0.71 0.61 0.59  CALCIUM  --    < > 8.3*  --  8.1* 8.3* 8.2* 8.3*  MG 2.1  --  1.9  --  2.0 2.0  --   --   PHOS 1.6*  --  2.6  --  3.6 3.0  --   --    < > = values in this interval not  displayed.   Liver Function Tests: Recent Labs  Lab 02/09/21 0419 02/11/21 0850  AST 46* 54*  ALT 33 47*  ALKPHOS 57 86  BILITOT 1.1 0.7  PROT 6.0* 5.6*  ALBUMIN 2.7* 2.4*   No results for input(s): LIPASE, AMYLASE in the last 168 hours. No results for input(s): AMMONIA in the last 168 hours. CBC: Recent Labs  Lab 02/10/21 0430 02/11/21 0850 02/12/21 0839 02/13/21 0630  WBC 14.9* 15.5* 13.3* 11.3*  HGB 13.6 11.6* 10.6* 11.0*  HCT 40.2 34.8* 32.2* 32.9*  MCV 93.7 94.8 97.9 96.2  PLT 336 304 310 310   Cardiac Enzymes: No results for input(s): CKTOTAL, CKMB, CKMBINDEX, TROPONINI in the last 168 hours. BNP: Invalid input(s): POCBNP CBG: Recent Labs  Lab 02/11/21 1439 02/11/21 1657 02/11/21 2124 02/12/21 0604 02/12/21 1222  GLUCAP 105* 114* 99 126* 101*   D-Dimer No results for input(s): DDIMER in the last 72 hours. Hgb A1c No results for input(s): HGBA1C in the last 72 hours. Lipid Profile Recent Labs    02/11/21 0850  TRIG 124   Thyroid function studies No results for input(s): TSH, T4TOTAL, T3FREE, THYROIDAB in the last 72 hours.  Invalid input(s): FREET3 Anemia work up No results for input(s): VITAMINB12, FOLATE, FERRITIN, TIBC, IRON, RETICCTPCT in the last 72 hours. Urinalysis    Component Value Date/Time   COLORURINE YELLOW 02/03/2021 0730   APPEARANCEUR CLEAR 02/03/2021 0730   LABSPEC 1.044 (H) 02/03/2021 0730   PHURINE 5.0 02/03/2021 0730   GLUCOSEU NEGATIVE 02/03/2021 0730   HGBUR NEGATIVE 02/03/2021 0730   BILIRUBINUR NEGATIVE 02/03/2021 0730   KETONESUR NEGATIVE 02/03/2021 0730   PROTEINUR 30 (A) 02/03/2021 0730   UROBILINOGEN 0.2 11/19/2011 2034   NITRITE NEGATIVE 02/03/2021 0730   LEUKOCYTESUR NEGATIVE 02/03/2021 0730   Sepsis Labs Invalid input(s): PROCALCITONIN,  WBC,  LACTICIDVEN Microbiology Recent Results (from the past 240 hour(s))  Gram stain     Status: None   Collection Time: 02/10/21 12:52 PM   Specimen: Fluid  Result  Value Ref Range Status   Specimen Description FLUID  Final   Special Requests NONE  Final   Gram Stain   Final    CYTOSPIN SMEAR WBC SEEN NO ORGANISMS SEEN Performed at [facility], [address] [place],  [address]    Report Status 02/10/2021 FINAL  Final  Culture, body fluid w Gram Stain-bottle     Status: None (Preliminary result)   Collection Time: 02/10/21 12:52 PM   Specimen: Fluid  Result Value Ref Range Status   Specimen Description FLUID  Final   Special Requests NONE  Final   Culture   Final    NO  GROWTH 3 DAYS Performed at [facility], [place] [address], [place], [place] [address]    Report Status PENDING  Incomplete     Time coordinating discharge: 45 minutes  SIGNED:   Tawni Millers, MD  [facility] 02/13/2021, 8:39 AM

## 2021-02-13 NOTE — Progress Notes (Signed)
Patient ID: Dana Hall, female   DOB: 09/13/1940, 80 y.o.   MRN: [id number] [facility] Surgery Progress Note  9 Days Post-Op  Subjective: CC-  Daughter at bedside. Feeling better today. Abdomen sore but pain improving. Denies n/v. Tolerating diet but does not like the food options. She had several loose stools yesterday, but none so far today since midnight.  Objective: Vital signs in last 24 hours: Temp:  [97.8 F (36.6 C)-98.3 F (36.8 C)] 98.2 F (36.8 C) (11/02 0343) Pulse Rate:  [80-94] 80 (11/02 0343) Resp:  [18-20] 20 (11/02 0343) BP: (111-131)/(50-76) 126/76 (11/02 0343) SpO2:  [93 %-97 %] 95 % (11/02 0343) Weight:  [57.4 kg] 57.4 kg (11/02 0343) Last BM Date: 02/12/21  Intake/Output from previous day: 11/01 0701 - 11/02 0700 In: 20 [I.V.:20] Out: -  Intake/Output this shift: No intake/output data recorded.  PE: Gen:  Alert, NAD, pleasant Pulm:  rate and effort normal on room air Abd: Soft, mild distension, appropriately tender, +BS, midline incision cdi with staples present and no erythema or drainage  Lab Results:  Recent Labs    02/12/21 0839 02/13/21 0630  WBC 13.3* 11.3*  HGB 10.6* 11.0*  HCT 32.2* 32.9*  PLT 310 310   BMET Recent Labs    02/12/21 0700 02/13/21 0630  NA 135 137  K 3.8 4.0  CL 101 102  CO2 27 27  GLUCOSE 120* 100*  BUN 28* 32*  CREATININE 0.61 0.59  CALCIUM 8.2* 8.3*   PT/INR No results for input(s): LABPROT, INR in the last 72 hours. CMP     Component Value Date/Time   NA 137 02/13/2021 0630   K 4.0 02/13/2021 0630   CL 102 02/13/2021 0630   CO2 27 02/13/2021 0630   GLUCOSE 100 (H) 02/13/2021 0630   BUN 32 (H) 02/13/2021 0630   CREATININE 0.59 02/13/2021 0630   CREATININE 0.50 12/22/2011 1045   CALCIUM 8.3 (L) 02/13/2021 0630   PROT 5.6 (L) 02/11/2021 0850   ALBUMIN 2.4 (L) 02/11/2021 0850   AST 54 (H) 02/11/2021 0850   ALT 47 (H) 02/11/2021 0850   ALKPHOS 86 02/11/2021 0850   BILITOT 0.7 02/11/2021  0850   GFRNONAA >60 02/13/2021 0630   GFRAA >90 11/24/2011 0409   Lipase     Component Value Date/Time   LIPASE 48 02/02/2021 1148       Studies/Results: No results found.  Anti-infectives: Anti-infectives (From admission, onward)    None        Assessment/Plan SBO, recurrent POD#9 s/p exploratory laparotomy lysis of adhesions 02/04/21 Dr.Connor - Tolerating diet and having bowel function. Loose stool have improving. Ok for discharge today from surgical standpoint. Discharge instructions and follow up info on AVS.   Acute hypoxic respiratory failure - improved, back on room air, see below, per medicine B Pulmonary emboli - per medicine on lovenox CHF/BNP >3000 - per medicine, lasix A fib Large B pleural effusions - per medicine, lasix, s/p R thoracentesis 10/30   FEN: soft diet, Ensure ID: none VTE: SCD's, therapeutic Lovenox  Foley: none   HTN   LOS: 11 days    Franne Forts, [facility] 02/13/2021, 8:16 AM Please see Amion for pager number during day hours 7:00am-4:30pm

## 2021-02-13 NOTE — Progress Notes (Signed)
Patient AVS complete, patient is waiting for PICC line removal then wait time.

## 2021-02-13 NOTE — Plan of Care (Signed)
DISCHARGE NOTE HOME Dana Hall to be discharged home per MD order. Discussed prescriptions and follow up appointments with the patient. Medication list explained in detail. Patient verbalized understanding.  Skin clean, dry and intact without evidence of skin break down, no evidence of skin tears noted. IV catheter discontinued intact. Site without signs and symptoms of complications. Dressing and pressure applied. Pt denies pain at the site currently. No complaints noted.  Patient free of lines, drains, and wounds.   An After Visit Summary (AVS) was printed and given to the patient. Patient escorted via wheelchair, and discharged home via private auto.  Arlice Colt, RN

## 2021-02-13 NOTE — Plan of Care (Signed)

## 2021-02-13 NOTE — Progress Notes (Signed)
Physical Therapy Treatment and Discharge Patient Details Name: Dana Hall MRN: [id number] DOB: [birth year] Today's Date: 02/13/2021   History of Present Illness Pt is an 80 y.o. female admitted on 02/02/21 with new onset abdominal pain; S/p Exploratory laparotomy, lysis of adhesions on 10/24. C/o SOB on 10/29 due to PE and pleural effusions. S/p R thoracentesis 10/30. PMH includes: SBO status post partial small bowel resection 2013, HTN.    PT Comments    Pt is progressing well with mobility. Today's session focused on ambulation and stair training to prepare for return home. Pt able to ambulate 784 ft in hallway with no DME and supervision for safety. Reported feeling off balance and lightheaded at times but pt self-monitored symptoms well and took 2-3 standing rest breaks as needed. Provided education on conserving energy to enhance activity tolerance. Pt is confident about returning home and does not think she will need HH PT; PT in agreement. All short term acute PT goals met; d/c from acute care PT at this time.  Recommendations for follow up therapy are one component of a multi-disciplinary discharge planning process, led by the attending physician.  Recommendations may be updated based on patient status, additional functional criteria and insurance authorization.  Follow Up Recommendations  No PT follow up     Assistance Recommended at Discharge PRN  Equipment Recommendations  None recommended by PT    Recommendations for Other Services       Precautions / Restrictions Precautions Precautions: Fall Restrictions Weight Bearing Restrictions: No     Mobility  Bed Mobility               General bed mobility comments: Pt received standing in room after wash-up    Transfers Overall transfer level: Independent   Transfers: Sit to/from Stand             General transfer comment: Pt sat EOB after ambulation    Ambulation/Gait Ambulation/Gait assistance:  Supervision Gait Distance (Feet): 784 Feet Assistive device: None Gait Pattern/deviations: Step-through pattern;Decreased stride length;Narrow base of support Gait velocity: decreased   General Gait Details: Slow but steady. No LOB but pt did report feeling a little of balance and lightheaded. Self-monitored symptoms well taking 2-3 short standing rest breaks.   Stairs Stairs: Yes Stairs assistance: Min guard Stair Management: One rail Left;Alternating pattern Number of Stairs: 5 General stair comments: Min gaurd for safety; pt is confident she will be able to manage the 2 stairs she has to enter and her son will be there to help; PT in agreement   Wheelchair Mobility    Modified Rankin (Stroke Patients Only)       Balance Overall balance assessment: No apparent balance deficits (not formally assessed)   Sitting balance-Leahy Scale: Good Sitting balance - Comments: Pt sat EOB with feet supported and hands in lap   Standing balance support: During functional activity;No upper extremity supported Standing balance-Leahy Scale: Good Standing balance comment: Pt able to maintain balance statically and during ambulation with no DME             High level balance activites: Turns;Head turns;Direction changes High Level Balance Comments: No LOB or instability noted with these higher level balance activities            Cognition Arousal/Alertness: Awake/alert Behavior During Therapy: WFL for tasks assessed/performed Overall Cognitive Status: Within Functional Limits for tasks assessed  General Comments: very talkative        Exercises      General Comments General comments (skin integrity, edema, etc.): Pt educated on energy conservation and fall risk reduction, provided handout      Pertinent Vitals/Pain Pain Assessment: Faces Faces Pain Scale: No hurt    Home Living                          Prior  Function            PT Goals (current goals can now be found in the care plan section) Acute Rehab PT Goals Patient Stated Goal: Home at d/c, be able to do more PT Goal Formulation: With patient Time For Goal Achievement: 02/25/21 Potential to Achieve Goals: Good Progress towards PT goals: Goals met/education completed, patient discharged from PT    Frequency    Min 3X/week      PT Plan Discharge plan needs to be updated    Co-evaluation              AM-PAC PT "6 Clicks" Mobility   Outcome Measure  Help needed turning from your back to your side while in a flat bed without using bedrails?: A Little Help needed moving from lying on your back to sitting on the side of a flat bed without using bedrails?: A Little Help needed moving to and from a bed to a chair (including a wheelchair)?: None Help needed standing up from a chair using your arms (e.g., wheelchair or bedside chair)?: None Help needed to walk in hospital room?: None Help needed climbing 3-5 steps with a railing? : A Little 6 Click Score: 21    End of Session Equipment Utilized During Treatment: Gait belt Activity Tolerance: Patient tolerated treatment well Patient left: in bed Nurse Communication: Mobility status PT Visit Diagnosis: Unsteadiness on feet (R26.81);Dizziness and giddiness (R42)     Time: 2025-4270 PT Time Calculation (min) (ACUTE ONLY): 16 min  Charges:  $Self Care/Home Management: 8-22                     Brandon Melnick, SPT   Brandon Melnick 02/13/2021, 10:17 AM

## 2021-02-15 ENCOUNTER — Telehealth: Payer: Self-pay | Admitting: Cardiovascular Disease

## 2021-02-15 LAB — CULTURE, BODY FLUID W GRAM STAIN -BOTTLE: Culture: NO GROWTH

## 2021-02-15 NOTE — Telephone Encounter (Addendum)
Pt called to report that she has been diagnosed with new Afib when she was in the hospital for a bowel obstruction 02/02/21. She was put on Eliquis.Dana Hall she has been feeling well since except for some SOB with exertion... I have asked her to continue to monitor her BP and HR.blood pressure this morning was 112/67 but she was unsure of her HR.   I have discussed the importance of her taking her Eliquis daily with no missed doses.. to be sure that she is hydrating well and getting plenty of rest.   I made her an appt with Dr. Elease Hashimoto 02/22/21.   She will check her BP and HR and I will call her back in a few minutes to be sure her rate is in control.

## 2021-02-15 NOTE — Telephone Encounter (Signed)
   Pt said she was in the hospital, 2 days after her procedure she went Afib, they said they need to see Dr. Elease Hashimoto as soon as possible, gave 11/16 appt, they said it needs to be sooner since the doctor in the hospital also change her meds. Pt only wants to see Dr. Elease Hashimoto and asked to send a message to him

## 2021-02-15 NOTE — Telephone Encounter (Signed)
I called the pt back and her BP is 108/65 and HR 83... she will continue to monitor and will call if she needs anything prior to her 02/22/21 appt.   Will forward to Dr. Elease Hashimoto to be sure no other recommendations until she is seen.

## 2021-02-21 ENCOUNTER — Encounter: Payer: Self-pay | Admitting: Cardiovascular Disease

## 2021-02-21 NOTE — Progress Notes (Signed)
Cardiology Office Note:    Date:  02/23/2021   ID:  Dana Hall, DOB 14-Apr-1941, MRN [id number]  PCP:  [name], MD  Cardiologist:    Ken Bonn   Electrophysiologist:  None   Referring MD: [name], MD   Chief Complaint  Patient presents with   Hypertension        Atrial Fibrillation    Oct. 12, 2020    Dana Hall is a 80 y.o. female with a hx of HTN.  She has had some dizziness I saw her via telemedicine visit in July, 2020 and thought she had orthostatic hypotension. Echo showed normal LV systolic function and grade 2 diastolic dysfunction She has mild Aortic insufficiency  Overall is feeling well BP is a bit high today .   Has been rushing around this am.  Takes her BP at home and typically gets 120's   Remains active .    July 30, 2020 Dana Hall is seen for follow up of her HTN  No cardiac complaints  Works on the treadmill at [employer]   No CP or dyspnea   Nov. 11, 2022 Dana Hall is seen today for follow up of a recent hospitalization. She was hospitalized with a small bowell obstruction  Her hospitalization was complicated by pulmonary embolus and atrial fibrillation .   She was started on Eliquis  She had bilateral large pleural effusions. Had ridght thoracentesis    Last ECG was Oct. 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  EXPLORATORY LAPAROTOMY;  Surgeon: Ernestene Mention, MD;  Location: [facility];  Service: General;  Laterality: N/A;  exploratory laparotomy for bowel obstruction   LAPAROTOMY N/A 02/04/2021   Procedure: EXPLORATORY LAPAROTOMY;  Surgeon: Berna Bue, MD;  Location: [facility] OR;  Service: General;  Laterality: N/A;   LYSIS OF ADHESION N/A 02/04/2021   Procedure: LYSIS OF ADHESION WITH ADHESION BARRIER PLACED;  Surgeon: Berna Bue, MD;  Location: [facility] OR;  Service: General;  Laterality: N/A;   MULTIPLE TOOTH EXTRACTIONS     age 23    Current Medications: Current Meds  Medication Sig   acetaminophen (TYLENOL) 500 MG tablet Take 500 mg by mouth every 6 (six) hours as needed for mild pain.   famotidine (PEPCID) 20 MG tablet Take 1 tablet (20 mg total) by mouth daily.   feeding supplement (ENSURE ENLIVE / ENSURE PLUS) LIQD Take 237 mLs by mouth 3 (three) times daily between meals.   losartan (COZAAR) 25 MG tablet Take 1 tablet (25 mg total) by mouth daily.   methocarbamol (ROBAXIN) 500 MG  tablet Take 1 tablet (500 mg total) by mouth every 8 (eight) hours as needed for muscle spasms.   [DISCONTINUED] amLODipine (NORVASC) 5 MG tablet Take 1 tablet (5 mg total) by mouth at bedtime. Please resume taking when blood pressure 140/90 or greater   [DISCONTINUED] apixaban (ELIQUIS) 5 MG TABS tablet Take 2 tablets twice daily until 02/16/21, then on 02/17/21 start taking take 1 tablet twice daily   [DISCONTINUED] lisinopril (ZESTRIL) 20 MG tablet Take 1 tablet (20 mg total) by mouth at bedtime. Please resume taking when blood pressure 140/90 or greater.     Allergies:   Benadryl [diphenhydramine]   Social History   Socioeconomic History   Marital status: Married    Spouse name: Not on file   Number of children: 3    Years of education: Not on file   Highest education level: Not on file  Occupational History   Occupation: retired special ed Runner, broadcasting/film/video  Tobacco Use   Smoking status: Never   Smokeless tobacco: Never  Vaping Use   Vaping Use: Never used  Substance and Sexual Activity   Alcohol use: Yes   Drug use: No   Sexual activity: Not on file  Other Topics Concern   Not on file  Social History Narrative   Not on file   Social Determinants of Health   Financial Resource Strain: Not on file  Food Insecurity: Not on file  Transportation Needs: Not on file  Physical Activity: Not on file  Stress: Not on file  Social Connections: Not on file     Family History: The patient's family history includes Heart disease in her mother. There is no history of Breast cancer.  ROS:   Please see the history of present illness.     All other systems reviewed and are negative.  EKGs/Labs/Other Studies Reviewed:    The following studies were reviewed today:      Recent Labs: 02/09/2021: B Natriuretic Peptide 3,335.5 02/11/2021: ALT 47; Magnesium 2.0 02/13/2021: BUN 32; Creatinine, Ser 0.59; Hemoglobin 11.0; Platelets 310; Potassium 4.0; Sodium 137  Recent Lipid Panel    Component Value Date/Time   TRIG 124 02/11/2021 0850    Physical Exam:    Physical Exam: Blood pressure 122/78, pulse 85, height 5\' 7"  (1.702 m), weight 125 lb (56.7 kg), SpO2 96 %.  GEN: elderly female,  NAD , somewhat frail  HEENT: Normal NECK: No JVD; No carotid bruits LYMPHATICS: No lymphadenopathy CARDIAC: RRR , soft systolic murmur   RESPIRATORY:  Clear to auscultation without rales, wheezing or rhonchi  ABDOMEN: slightly distended.   ,  mildly tender,  + BS  MUSCULOSKELETAL:  No edema; No deformity  SKIN: Warm and dry NEUROLOGIC:  Alert and oriented x 3  EKG:   Nov. 11, 2022:   NSR ,LBBB / IVCD TWI ant.   ASSESSMENT:    1. Atypical atrial flutter (HCC)   2. Other acute pulmonary embolism with acute  cor pulmonale (HCC)     PLAN:      Small bowel obstruction : This is her second episode of small bowel obstruction.  She seems to be getting better.  I do not know of a clear-cut reason for her small bowel obstruction but I will leave that to her primary medical doctor.  2.  Atrial fib: associated with the stress of a SBO.  She has converted to NSR. I think she should continue eliquis  CHADS2VASC is at least 41 ( female, age 44, CHF)  I would like to continue Eliquis, probably lifelong.    3.  Pulmonary embolus :   Its not clear whether her pulmonary embolus was provoked or unprovoked.  It may have been related to her acute illness but I am not sure.  She was written for Eliquis for 3 months but I think that she would benefit from taking it lifelong, especially since she also has atrial fibrillation.  4.  Acute systolic CHF:   Echo during her hospitalization was a very difficult study with poor image quality .   EF ~ 40%.   Ant. Wall akinesis. She remains very weak but is not having any CP We stopped her amlodipine and lisinopril due to hypotension and generalized weakness.  She has some TWI in the anterior and lateral leads on her ECG .  ? MI during her hospitalization  ? Due to her acute illness. No signs or symptoms of ischemia or  worsening CHF. ? Takotsubo like issue due to the stress of her recent hospitalization. Currently, very stable , no symptoms   At present , she is very weak and is recovering form her SBO.   Will allow her to improve,  recheck ECG We can continue further work up at her next appt-  she is not having any cardiac symptoms at present .   Will add Losartan 25 mg a day. Will have her follow up with me in the next several weeks - sooner if she has any chest pain or progressive dyspnea.    Medication Adjustments/Labs and Tests Ordered: Current medicines are reviewed at length with the patient today.  Concerns regarding medicines are outlined above.   Orders Placed This Encounter  Procedures   EKG 12-Lead    Meds ordered this encounter  Medications   apixaban (ELIQUIS) 5 MG TABS tablet    Sig: Take 1 tablet (5 mg total) by mouth 2 (two) times daily.    Dispense:  90 tablet    Refill:  1   losartan (COZAAR) 25 MG tablet    Sig: Take 1 tablet (25 mg total) by mouth daily.    Dispense:  30 tablet    Refill:  11      Patient Instructions  Medication Instructions:  Your physician recommends that you continue on your current medications as directed. Please refer to the Current Medication list given to you today.  *If you need a refill on your cardiac medications before your next appointment, please call your pharmacy*   Lab Work: None ordered.  If you have labs (blood work) drawn today and your tests are completely normal, you will receive your results only by: MyChart Message (if you have MyChart) OR A paper copy in the mail If you have any lab test that is abnormal or we need to change your treatment, we will call you to review the results.   Testing/Procedures: None ordered.    Follow-Up: At [facility], you and your health needs are our priority.  As part of our continuing mission to provide you with exceptional heart care, we have created designated Provider Care Teams.  These Care Teams include your primary Cardiologist (physician) and Advanced Practice Providers (APPs -  Physician Assistants and Nurse Practitioners) who all work together to provide you with the care you need, when you need it.  We recommend signing up for the patient portal called "MyChart".  Sign up information is provided on this After Visit Summary.  MyChart is used to connect with patients  for Virtual Visits (Telemedicine).  Patients are able to view lab/test results, encounter notes, upcoming appointments, etc.  Non-urgent messages can be sent to your provider as well.   To learn more about what you can do with MyChart, go to  [email or web address].    Your next appointment:   As scheduled with Dr Elease Hashimoto   Signed, Kristeen Miss, MD  02/23/2021 6:39 AM    [facility]

## 2021-02-22 ENCOUNTER — Ambulatory Visit: Payer: Medicare PPO | Admitting: Cardiovascular Disease

## 2021-02-22 ENCOUNTER — Encounter: Payer: Self-pay | Admitting: Cardiovascular Disease

## 2021-02-22 ENCOUNTER — Other Ambulatory Visit: Payer: Self-pay

## 2021-02-22 VITALS — BP 122/78 | HR 85 | Ht 67.0 in | Wt 125.0 lb

## 2021-02-22 DIAGNOSIS — I2609 Other pulmonary embolism with acute cor pulmonale: Secondary | ICD-10-CM | POA: Diagnosis not present

## 2021-02-22 DIAGNOSIS — I2699 Other pulmonary embolism without acute cor pulmonale: Secondary | ICD-10-CM | POA: Insufficient documentation

## 2021-02-22 DIAGNOSIS — I484 Atypical atrial flutter: Secondary | ICD-10-CM

## 2021-02-22 MED ORDER — APIXABAN 5 MG PO TABS: 5.0000 mg | ORAL_TABLET | Freq: Two times a day (BID) | ORAL | 1 refills | Status: DC

## 2021-02-22 NOTE — Patient Instructions (Signed)
Medication Instructions:  Your physician recommends that you continue on your current medications as directed. Please refer to the Current Medication list given to you today.  *If you need a refill on your cardiac medications before your next appointment, please call your pharmacy*   Lab Work: None ordered.  If you have labs (blood work) drawn today and your tests are completely normal, you will receive your results only by: MyChart Message (if you have MyChart) OR A paper copy in the mail If you have any lab test that is abnormal or we need to change your treatment, we will call you to review the results.   Testing/Procedures: None ordered.    Follow-Up: At [facility], you and your health needs are our priority.  As part of our continuing mission to provide you with exceptional heart care, we have created designated Provider Care Teams.  These Care Teams include your primary Cardiologist (physician) and Advanced Practice Providers (APPs -  Physician Assistants and Nurse Practitioners) who all work together to provide you with the care you need, when you need it.  We recommend signing up for the patient portal called "MyChart".  Sign up information is provided on this After Visit Summary.  MyChart is used to connect with patients for Virtual Visits (Telemedicine).  Patients are able to view lab/test results, encounter notes, upcoming appointments, etc.  Non-urgent messages can be sent to your provider as well.   To learn more about what you can do with MyChart, go to [email or web address].    Your next appointment:   As scheduled with Dr Elease Hashimoto

## 2021-02-23 MED ORDER — LOSARTAN POTASSIUM 25 MG PO TABS: 25.0000 mg | ORAL_TABLET | Freq: Every day | ORAL | 11 refills | Status: DC

## 2021-02-25 ENCOUNTER — Telehealth: Payer: Self-pay

## 2021-02-25 NOTE — Telephone Encounter (Signed)
Outreach made to Pt.  Pt scheduled for March 18, 2021 at 4:00 pm.  Nahser, Deloris Ping, MD sent to [facility] Triage Please schedule [name] for a follow up appt in several weeks  I see openings on Dec. 5,6 14.  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    Thanks   [employer]

## 2021-02-26 DIAGNOSIS — I5032 Chronic diastolic (congestive) heart failure: Secondary | ICD-10-CM | POA: Diagnosis not present

## 2021-02-26 DIAGNOSIS — Z1389 Encounter for screening for other disorder: Secondary | ICD-10-CM | POA: Diagnosis not present

## 2021-02-26 DIAGNOSIS — I700 Atherosclerosis of aorta: Secondary | ICD-10-CM | POA: Diagnosis not present

## 2021-02-26 DIAGNOSIS — E611 Iron deficiency: Secondary | ICD-10-CM | POA: Diagnosis not present

## 2021-02-26 DIAGNOSIS — I4891 Unspecified atrial fibrillation: Secondary | ICD-10-CM | POA: Diagnosis not present

## 2021-02-26 DIAGNOSIS — Z0000 Encounter for general adult medical examination without abnormal findings: Secondary | ICD-10-CM | POA: Diagnosis not present

## 2021-02-26 DIAGNOSIS — H903 Sensorineural hearing loss, bilateral: Secondary | ICD-10-CM | POA: Diagnosis not present

## 2021-02-26 DIAGNOSIS — I10 Essential (primary) hypertension: Secondary | ICD-10-CM | POA: Diagnosis not present

## 2021-02-26 DIAGNOSIS — I2699 Other pulmonary embolism without acute cor pulmonale: Secondary | ICD-10-CM | POA: Diagnosis not present

## 2021-03-06 DIAGNOSIS — M25552 Pain in left hip: Secondary | ICD-10-CM | POA: Diagnosis not present

## 2021-03-06 DIAGNOSIS — M5451 Vertebrogenic low back pain: Secondary | ICD-10-CM | POA: Diagnosis not present

## 2021-03-18 ENCOUNTER — Ambulatory Visit: Payer: Medicare PPO | Admitting: Cardiovascular Disease

## 2021-03-18 ENCOUNTER — Other Ambulatory Visit: Payer: Self-pay

## 2021-03-18 ENCOUNTER — Encounter: Payer: Self-pay | Admitting: Cardiovascular Disease

## 2021-03-18 ENCOUNTER — Encounter: Payer: Self-pay | Admitting: *Deleted

## 2021-03-18 VITALS — BP 138/70 | HR 54 | Ht 67.0 in | Wt 124.8 lb

## 2021-03-18 DIAGNOSIS — I5043 Acute on chronic combined systolic (congestive) and diastolic (congestive) heart failure: Secondary | ICD-10-CM | POA: Diagnosis not present

## 2021-03-18 DIAGNOSIS — Z79899 Other long term (current) drug therapy: Secondary | ICD-10-CM

## 2021-03-18 DIAGNOSIS — I484 Atypical atrial flutter: Secondary | ICD-10-CM

## 2021-03-18 DIAGNOSIS — M25552 Pain in left hip: Secondary | ICD-10-CM | POA: Diagnosis not present

## 2021-03-18 DIAGNOSIS — I10 Essential (primary) hypertension: Secondary | ICD-10-CM

## 2021-03-18 DIAGNOSIS — M5451 Vertebrogenic low back pain: Secondary | ICD-10-CM | POA: Diagnosis not present

## 2021-03-18 MED ORDER — LOSARTAN POTASSIUM 50 MG PO TABS: 50.0000 mg | ORAL_TABLET | Freq: Every day | ORAL | 3 refills | Status: DC

## 2021-03-18 NOTE — Progress Notes (Signed)
Cardiology Office Note:    Date:  03/18/2021   ID:  Dana Hall, DOB 1940-06-02, MRN [id number]  PCP:  [name], MD  Cardiologist:    Rosaland Shiffman   Electrophysiologist:  None   Referring MD: [name], MD   Chief Complaint  Patient presents with   Atrial Flutter        Hypertension         Oct. 12, 2020    Dana Hall is a 80 y.o. female with a hx of HTN.  She has had some dizziness I saw her via telemedicine visit in July, 2020 and thought she had orthostatic hypotension. Echo showed normal LV systolic function and grade 2 diastolic dysfunction She has mild Aortic insufficiency  Overall is feeling well BP is a bit high today .   Has been rushing around this am.  Takes her BP at home and typically gets 120's   Remains active .    July 30, 2020 Dana Hall is seen for follow up of her HTN  No cardiac complaints  Works on the treadmill at [employer]   No CP or dyspnea   Nov. 11, 2022 Dana Hall is seen today for follow up of a recent hospitalization. She was hospitalized with a small bowell obstruction  Her hospitalization was complicated by pulmonary embolus and atrial fibrillation .   She was started on Eliquis  She had bilateral large pleural effusions. Had ridght thoracentesis    Last ECG was Oct. 30 Shows atrial flutter   Is still very short of breath ,  difficult to sleep lying down   Dec. 5, 2022: Seen with Dana Hall ( daughter) , and Dana Hall is seen today for follow up of her atrial fib, pulmonary embolus. She has been on Eliquis. Has had bilateral plueral effusions  Has regained most of her strength   She has new TWI in the anterior leads.  LV function could not be assessed in the hospital  We started Losartan and plan to repeat echo .    She has been recording her blood pressure.  Most of her blood pressure readings are in the normal range and some are slightly high.  It looks like we do have room to increase the losartan if  needed.   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  EXPLORATORY LAPAROTOMY;  Surgeon: Ernestene Mention, MD;  Location: [facility] ORS;  Service: General;  Laterality: N/A;  exploratory laparotomy for bowel obstruction   LAPAROTOMY N/A 02/04/2021   Procedure: EXPLORATORY LAPAROTOMY;  Surgeon: Berna Bue, MD;  Location: [facility] OR;  Service: General;  Laterality: N/A;   LYSIS OF ADHESION N/A 02/04/2021   Procedure: LYSIS OF ADHESION WITH ADHESION BARRIER  PLACED;  Surgeon: Berna Bue, MD;  Location: [facility] OR;  Service: General;  Laterality: N/A;   MULTIPLE TOOTH EXTRACTIONS     age 48    Current Medications: Current Meds  Medication Sig   acetaminophen (TYLENOL) 500 MG tablet Take 500 mg by mouth every 6 (six) hours as needed for mild pain.   apixaban (ELIQUIS) 5 MG TABS tablet Take 1 tablet (5 mg total) by mouth 2 (two) times daily.   losartan (COZAAR) 50 MG tablet Take 1 tablet (50 mg total) by mouth daily.   [DISCONTINUED] losartan (COZAAR) 25 MG tablet Take 1 tablet (25 mg total) by mouth daily.     Allergies:   Benadryl [diphenhydramine]   Social History   Socioeconomic History   Marital status: Married    Spouse name: Not on file   Number of children: 3   Years of education:  Not on file   Highest education level: Not on file  Occupational History   Occupation: retired special ed Runner, broadcasting/film/video  Tobacco Use   Smoking status: Never   Smokeless tobacco: Never  Vaping Use   Vaping Use: Never used  Substance and Sexual Activity   Alcohol use: Yes   Drug use: No   Sexual activity: Not on file  Other Topics Concern   Not on file  Social History Narrative   Not on file   Social Determinants of Health   Financial Resource Strain: Not on file  Food Insecurity: Not on file  Transportation Needs: Not on file  Physical Activity: Not on file  Stress: Not on file  Social Connections: Not on file     Family History: The patient's family history includes Heart disease in her mother. There is no history of Breast cancer.  ROS:   Please see the history of present illness.     All other systems reviewed and are negative.  EKGs/Labs/Other Studies Reviewed:    The following studies were reviewed today:      Recent Labs: 02/09/2021: B Natriuretic Peptide 3,335.5 02/11/2021: ALT 47; Magnesium 2.0 02/13/2021: BUN 32; Creatinine, Ser 0.59; Hemoglobin 11.0; Platelets 310; Potassium 4.0; Sodium 137  Recent Lipid Panel    Component Value Date/Time   TRIG 124 02/11/2021 0850    Physical Exam:    Physical Exam: Blood pressure 138/70, pulse (!) 54, height 5\' 7"  (1.702 m), weight 124 lb 12.8 oz (56.6 kg), SpO2 99 %.  GEN:  Well nourished, well developed in no acute distress HEENT: Normal NECK: No JVD; No carotid bruits LYMPHATICS: No lymphadenopathy CARDIAC: RRR , no murmurs, rubs, gallops RESPIRATORY:  Clear to auscultation without rales, wheezing or rhonchi  ABDOMEN: Soft, non-tender, non-distended MUSCULOSKELETAL:  No edema; No deformity  SKIN: Warm and dry NEUROLOGIC:  Alert and oriented x 3   EKG:      ASSESSMENT:    1. Primary hypertension   2. Atypical atrial flutter (HCC)   3. Medication management   4. Acute on chronic combined systolic and  diastolic CHF (congestive heart failure) (HCC)      PLAN:      Small bowel obstruction :   2.  Atrial fib: associated with the stress of a SBO.  She has converted to NSR. I think she should continue eliquis  CHADS2VASC is at least 48 ( female, age 38, CHF)       3.  Pulmonary embolus :   Its not clear whether her pulmonary embolus was provoked or unprovoked.  It may have  been related to her acute illness but I am not sure.  She was written for Eliquis for 3 months but I think that she would benefit from taking it lifelong, especially since she also has atrial fibrillation.  4.  Acute systolic CHF:   Echo during her hospitalization was a very difficult study with poor image quality .   EF ~ 40%.   Ant. Wall akinesis. She remains very weak but is not having any CP We stopped her amlodipine and lisinopril due to hypotension and generalized weakness.  She has some TWI in the anterior and lateral leads on her ECG .  ? MI during her hospitalization  ? Due to her acute illness. No signs or symptoms of ischemia or  worsening CHF. ? Takotsubo like issue due to the stress of her recent hospitalization. Currently, very stable , no symptoms   At present , she is very weak and is recovering form her SBO.   Will allow her to improve,  recheck ECG    Will increase her Losartan to 50  BMP in 3 weeks.   Will get an echo to evaluate her LV functon and Lexiscan myoview To look for ischemia or signs of a previous MI   Medication Adjustments/Labs and Tests Ordered: Current medicines are reviewed at length with the patient today.  Concerns regarding medicines are outlined above.  Orders Placed This Encounter  Procedures   Basic metabolic panel   Cardiac Stress Test: Informed Consent Details: Physician/Practitioner Attestation; Transcribe to consent form and obtain patient signature   MYOCARDIAL PERFUSION IMAGING   ECHOCARDIOGRAM COMPLETE     Meds ordered this encounter  Medications    losartan (COZAAR) 50 MG tablet    Sig: Take 1 tablet (50 mg total) by mouth daily.    Dispense:  90 tablet    Refill:  3       Patient Instructions  Medication Instructions:  Please increase your Losartan to 50 mg a day. Continue all other medications as listed.  *If you need a refill on your cardiac medications before your next appointment, please call your pharmacy*  Lab Work: Please have blood work in 3 week (BMP) If you have labs (blood work) drawn today and your tests are completely normal, you will receive your results only by: MyChart Message (if you have MyChart) OR A paper copy in the mail If you have any lab test that is abnormal or we need to change your treatment, we will call you to review the results.  Testing/Procedures: Your physician has requested that you have an echocardiogram. Echocardiography is a painless test that uses sound waves to create images of your heart. It provides your doctor with information about the size and shape of your heart and how well your heart's chambers and valves are working. This procedure takes approximately one hour. There are no restrictions for this procedure.  Your physician has requested that you have a lexiscan myoview. For further information please visit [email or web address] Please follow instruction sheet, as given.  Follow-Up: At [facility], you and your health needs are our priority.  As part of our continuing mission to provide you with exceptional heart care, we have created designated Provider Care Teams.  These Care Teams include your primary Cardiologist (physician) and Advanced Practice Providers (APPs -  Physician Assistants and Nurse Practitioners) who all work together to provide you with the care you need, when you need it.  We recommend signing up for the patient portal called "MyChart".  Sign up information is provided on this After Visit Summary.  MyChart is used to connect with patients for Virtual Visits  (Telemedicine).  Patients are able to view lab/test results, encounter notes, upcoming appointments, etc.  Non-urgent messages can be sent to your provider as well.   To learn more about what you can do with MyChart, go to [email or web address].    Your next appointment:   3 month(s)  The format for your next appointment:   In Person  Provider:   Kristeen Miss, MD     Thank you for choosing [facility]!!     Signed, Kristeen Miss, MD  03/18/2021 5:31 PM    [facility]

## 2021-03-18 NOTE — Patient Instructions (Signed)
Medication Instructions:  Please increase your Losartan to 50 mg a day. Continue all other medications as listed.  *If you need a refill on your cardiac medications before your next appointment, please call your pharmacy*  Lab Work: Please have blood work in 3 week (BMP) If you have labs (blood work) drawn today and your tests are completely normal, you will receive your results only by: MyChart Message (if you have MyChart) OR A paper copy in the mail If you have any lab test that is abnormal or we need to change your treatment, we will call you to review the results.  Testing/Procedures: Your physician has requested that you have an echocardiogram. Echocardiography is a painless test that uses sound waves to create images of your heart. It provides your doctor with information about the size and shape of your heart and how well your heart's chambers and valves are working. This procedure takes approximately one hour. There are no restrictions for this procedure.  Your physician has requested that you have a lexiscan myoview. For further information please visit [email or web address] Please follow instruction sheet, as given.  Follow-Up: At [facility], you and your health needs are our priority.  As part of our continuing mission to provide you with exceptional heart care, we have created designated Provider Care Teams.  These Care Teams include your primary Cardiologist (physician) and Advanced Practice Providers (APPs -  Physician Assistants and Nurse Practitioners) who all work together to provide you with the care you need, when you need it.  We recommend signing up for the patient portal called "MyChart".  Sign up information is provided on this After Visit Summary.  MyChart is used to connect with patients for Virtual Visits (Telemedicine).  Patients are able to view lab/test results, encounter notes, upcoming appointments, etc.  Non-urgent messages can be sent to your provider as  well.   To learn more about what you can do with MyChart, go to [email or web address].    Your next appointment:   3 month(s)  The format for your next appointment:   In Person  Provider:   Kristeen Miss, MD     Thank you for choosing [facility]!!

## 2021-03-21 DIAGNOSIS — M5451 Vertebrogenic low back pain: Secondary | ICD-10-CM | POA: Diagnosis not present

## 2021-03-21 DIAGNOSIS — M25552 Pain in left hip: Secondary | ICD-10-CM | POA: Diagnosis not present

## 2021-03-25 DIAGNOSIS — M25552 Pain in left hip: Secondary | ICD-10-CM | POA: Diagnosis not present

## 2021-03-25 DIAGNOSIS — M5451 Vertebrogenic low back pain: Secondary | ICD-10-CM | POA: Diagnosis not present

## 2021-03-28 DIAGNOSIS — M25552 Pain in left hip: Secondary | ICD-10-CM | POA: Diagnosis not present

## 2021-03-28 DIAGNOSIS — M5451 Vertebrogenic low back pain: Secondary | ICD-10-CM | POA: Diagnosis not present

## 2021-04-02 DIAGNOSIS — M25552 Pain in left hip: Secondary | ICD-10-CM | POA: Diagnosis not present

## 2021-04-02 DIAGNOSIS — M5451 Vertebrogenic low back pain: Secondary | ICD-10-CM | POA: Diagnosis not present

## 2021-04-03 ENCOUNTER — Telehealth (HOSPITAL_COMMUNITY): Payer: Self-pay | Admitting: *Deleted

## 2021-04-03 ENCOUNTER — Encounter (HOSPITAL_COMMUNITY): Payer: Self-pay | Admitting: *Deleted

## 2021-04-03 NOTE — Telephone Encounter (Signed)
Patient given detailed instructions per Myocardial Perfusion Study Information Sheet for the test on 04/10/21 at 0800. Patient notified to arrive 15 minutes early and that it is imperative to arrive on time for appointment to keep from having the test rescheduled.  If you need to cancel or reschedule your appointment, please call the office within 24 hours of your appointment. . Patient verbalized understanding.Lona Six, Adelene Idler Mychart letter sent

## 2021-04-05 DIAGNOSIS — M25552 Pain in left hip: Secondary | ICD-10-CM | POA: Diagnosis not present

## 2021-04-05 DIAGNOSIS — M5451 Vertebrogenic low back pain: Secondary | ICD-10-CM | POA: Diagnosis not present

## 2021-04-09 DIAGNOSIS — M5451 Vertebrogenic low back pain: Secondary | ICD-10-CM | POA: Diagnosis not present

## 2021-04-09 DIAGNOSIS — M25552 Pain in left hip: Secondary | ICD-10-CM | POA: Diagnosis not present

## 2021-04-10 ENCOUNTER — Other Ambulatory Visit: Payer: Self-pay | Admitting: Cardiovascular Disease

## 2021-04-10 ENCOUNTER — Ambulatory Visit (HOSPITAL_COMMUNITY): Payer: Medicare PPO | Attending: Cardiology

## 2021-04-10 ENCOUNTER — Ambulatory Visit (HOSPITAL_BASED_OUTPATIENT_CLINIC_OR_DEPARTMENT_OTHER): Payer: Medicare PPO

## 2021-04-10 ENCOUNTER — Other Ambulatory Visit: Payer: Medicare PPO | Admitting: *Deleted

## 2021-04-10 ENCOUNTER — Other Ambulatory Visit: Payer: Self-pay

## 2021-04-10 DIAGNOSIS — I10 Essential (primary) hypertension: Secondary | ICD-10-CM | POA: Diagnosis not present

## 2021-04-10 DIAGNOSIS — I484 Atypical atrial flutter: Secondary | ICD-10-CM | POA: Diagnosis not present

## 2021-04-10 DIAGNOSIS — Z79899 Other long term (current) drug therapy: Secondary | ICD-10-CM | POA: Diagnosis not present

## 2021-04-10 LAB — BASIC METABOLIC PANEL
BUN/Creatinine Ratio: 34 — ABNORMAL HIGH (ref 12–28)
BUN: 21 mg/dL (ref 8–27)
CO2: 25 mmol/L (ref 20–29)
Calcium: 9.5 mg/dL (ref 8.7–10.3)
Chloride: 98 mmol/L (ref 96–106)
Creatinine, Ser: 0.62 mg/dL (ref 0.57–1.00)
Glucose: 86 mg/dL (ref 70–99)
Potassium: 4.9 mmol/L (ref 3.5–5.2)
Sodium: 138 mmol/L (ref 134–144)
eGFR: 89 mL/min/{1.73_m2} (ref >59)

## 2021-04-10 LAB — ECHOCARDIOGRAM LIMITED
Area-P 1/2: 4.1 cm2
P 1/2 time: 585 msec
S' Lateral: 2.9 cm
Weight: 1984 oz

## 2021-04-10 LAB — MYOCARDIAL PERFUSION IMAGING
LV dias vol: 86 mL (ref 46–106)
LV sys vol: 38 mL
Nuc Stress EF: 56 %
Peak HR: 89 {beats}/min
Rest HR: 69 {beats}/min
Rest Nuclear Isotope Dose: 8.1 mCi
SDS: 0
SRS: 6
SSS: 6
ST Depression (mm): 0 mm
Stress Nuclear Isotope Dose: 29.4 mCi
TID: 1.02

## 2021-04-10 MED ORDER — REGADENOSON 0.4 MG/5ML IV SOLN
0.4000 mg | Freq: Once | INTRAVENOUS | Status: AC
  Administered 2021-04-10: 0.4 mg via INTRAVENOUS

## 2021-04-10 MED ORDER — TECHNETIUM TC 99M TETROFOSMIN IV KIT
29.4000 | PACK | Freq: Once | INTRAVENOUS | Status: AC | PRN
  Administered 2021-04-10: 29.4 via INTRAVENOUS
  Filled 2021-04-10: qty 30

## 2021-04-10 MED ORDER — TECHNETIUM TC 99M TETROFOSMIN IV KIT
8.1000 | PACK | Freq: Once | INTRAVENOUS | Status: AC | PRN
  Administered 2021-04-10: 8.1 via INTRAVENOUS
  Filled 2021-04-10: qty 9

## 2021-04-10 MED ORDER — PERFLUTREN LIPID MICROSPHERE
2.0000 mL | INTRAVENOUS | Status: AC | PRN
  Administered 2021-04-10: 2 mL via INTRAVENOUS

## 2021-04-11 DIAGNOSIS — M25552 Pain in left hip: Secondary | ICD-10-CM | POA: Diagnosis not present

## 2021-04-11 DIAGNOSIS — M5451 Vertebrogenic low back pain: Secondary | ICD-10-CM | POA: Diagnosis not present

## 2021-04-12 DIAGNOSIS — M7918 Myalgia, other site: Secondary | ICD-10-CM | POA: Diagnosis not present

## 2021-04-12 DIAGNOSIS — M545 Low back pain, unspecified: Secondary | ICD-10-CM | POA: Diagnosis not present

## 2021-04-18 DIAGNOSIS — M25552 Pain in left hip: Secondary | ICD-10-CM | POA: Diagnosis not present

## 2021-04-18 DIAGNOSIS — M5451 Vertebrogenic low back pain: Secondary | ICD-10-CM | POA: Diagnosis not present

## 2021-04-22 DIAGNOSIS — M5451 Vertebrogenic low back pain: Secondary | ICD-10-CM | POA: Diagnosis not present

## 2021-04-22 DIAGNOSIS — M25552 Pain in left hip: Secondary | ICD-10-CM | POA: Diagnosis not present

## 2021-04-25 DIAGNOSIS — M25552 Pain in left hip: Secondary | ICD-10-CM | POA: Diagnosis not present

## 2021-04-25 DIAGNOSIS — M5451 Vertebrogenic low back pain: Secondary | ICD-10-CM | POA: Diagnosis not present

## 2021-04-29 DIAGNOSIS — M5451 Vertebrogenic low back pain: Secondary | ICD-10-CM | POA: Diagnosis not present

## 2021-04-29 DIAGNOSIS — M25552 Pain in left hip: Secondary | ICD-10-CM | POA: Diagnosis not present

## 2021-05-06 ENCOUNTER — Other Ambulatory Visit: Payer: Self-pay | Admitting: Family Medicine

## 2021-05-06 DIAGNOSIS — Z1231 Encounter for screening mammogram for malignant neoplasm of breast: Secondary | ICD-10-CM

## 2021-05-22 DIAGNOSIS — L723 Sebaceous cyst: Secondary | ICD-10-CM | POA: Diagnosis not present

## 2021-05-22 DIAGNOSIS — D225 Melanocytic nevi of trunk: Secondary | ICD-10-CM | POA: Diagnosis not present

## 2021-05-22 DIAGNOSIS — L578 Other skin changes due to chronic exposure to nonionizing radiation: Secondary | ICD-10-CM | POA: Diagnosis not present

## 2021-05-22 DIAGNOSIS — L821 Other seborrheic keratosis: Secondary | ICD-10-CM | POA: Diagnosis not present

## 2021-05-22 DIAGNOSIS — L570 Actinic keratosis: Secondary | ICD-10-CM | POA: Diagnosis not present

## 2021-05-29 ENCOUNTER — Ambulatory Visit: Payer: Medicare PPO | Admitting: Cardiovascular Disease

## 2021-06-01 ENCOUNTER — Other Ambulatory Visit: Payer: Self-pay | Admitting: Cardiovascular Disease

## 2021-06-03 MED ORDER — APIXABAN 2.5 MG PO TABS: 2.5000 mg | ORAL_TABLET | Freq: Two times a day (BID) | ORAL | 1 refills | Status: DC

## 2021-06-03 NOTE — Telephone Encounter (Signed)
-----   Message from Vesta Mixer, MD sent at 06/03/2021  7:49 AM EST ----- Regarding: RE: I agree with changing Eliquis to 2.5 mg twice a day. ----- Message ----- From: Satira Sark, RN Sent: 06/03/2021   7:36 AM EST To: Vesta Mixer, MD  Pt last saw Dr Elease Hashimoto 03/18/21, last labs 04/10/21 Creat 0.62, age 81, weight 56.2kg, based on specified criteria pt is not on appropriate dosage of Eliquis.  Age >80, weight <60kg, based on specified criteria pt should be on Eliquis 2.5mg  BID.  Will send message to Dr Elease Hashimoto to see if dosage change appropriate. Please advise, Thanks!

## 2021-06-03 NOTE — Telephone Encounter (Addendum)
Will send in Eliquis 2.5mg  BID and notify pt of dosage change. Attempted to contact pt, LMOM TCB.

## 2021-06-03 NOTE — Telephone Encounter (Signed)
Pt last saw Dr Elease Hashimoto 03/18/21, last labs 04/10/21 Creat 0.62, age 81, weight 56.2kg, based on specified criteria pt is not on appropriate dosage of Eliquis.  Age >80, weight <60kg, based on specified criteria pt should be on Eliquis 2.5mg  BID.  Will send message to Dr Elease Hashimoto to see if dosage change appropriate.  Will await response.

## 2021-06-04 NOTE — Telephone Encounter (Signed)
Pt returned the call and stated that she got the message to call back and she picked up the new Eliquis refill. She stated she figured we called because of the change and advised we did. Explained why the dose was reduced to 2.5mg  BID and she verbalized understanding. She stated she had some Eliquis 5mg  tablets left and advised she could cut them in half (to get 2.5mg  twice a day) to finish them off them start the Eliquis 2.5mg  tabs twice a day and she verbalized understanding. She confirmed she will be at next weeks appt with Dr. as well.

## 2021-06-05 ENCOUNTER — Ambulatory Visit
Admission: RE | Admit: 2021-06-05 | Discharge: 2021-06-05 | Disposition: A | Discharge status: Home / Self Care | Payer: Medicare PPO | Source: Ambulatory Visit | Attending: Family Medicine | Admitting: Family Medicine

## 2021-06-05 DIAGNOSIS — Z1231 Encounter for screening mammogram for malignant neoplasm of breast: Secondary | ICD-10-CM

## 2021-06-12 ENCOUNTER — Other Ambulatory Visit: Payer: Self-pay

## 2021-06-12 ENCOUNTER — Encounter: Payer: Self-pay | Admitting: Cardiovascular Disease

## 2021-06-12 ENCOUNTER — Ambulatory Visit: Payer: Medicare PPO | Admitting: Cardiovascular Disease

## 2021-06-12 VITALS — BP 140/70 | HR 72 | Ht 67.0 in | Wt 127.2 lb

## 2021-06-12 DIAGNOSIS — I484 Atypical atrial flutter: Secondary | ICD-10-CM | POA: Diagnosis not present

## 2021-06-12 DIAGNOSIS — I5042 Chronic combined systolic (congestive) and diastolic (congestive) heart failure: Secondary | ICD-10-CM | POA: Diagnosis not present

## 2021-06-12 NOTE — Patient Instructions (Signed)
Medication Instructions:  ?Your physician recommends that you continue on your current medications as directed. Please refer to the Current Medication list given to you today. ? ?*If you need a refill on your cardiac medications before your next appointment, please call your pharmacy* ? ? ?Lab Work: ?NONE ?If you have labs (blood work) drawn today and your tests are completely normal, you will receive your results only by: ?MyChart Message (if you have MyChart) OR ?A paper copy in the mail ?If you have any lab test that is abnormal or we need to change your treatment, we will call you to review the results. ? ? ?Testing/Procedures: ?NONE ? ? ?Follow-Up: ?At [facility], you and your health needs are our priority.  As part of our continuing mission to provide you with exceptional heart care, we have created designated Provider Care Teams.  These Care Teams include your primary Cardiologist (physician) and Advanced Practice Providers (APPs -  Physician Assistants and Nurse Practitioners) who all work together to provide you with the care you need, when you need it. ? ? ?Your next appointment:   ?6 month(s) ? ?The format for your next appointment:   ?In Person ? ?Provider:   ?Kristeen Miss, MD  or Eligha Bridegroom, NP or Tereso Newcomer, PA-C      ?  ? ? ?Other Instructions ?HAPPY BIRTHDAY!  ?

## 2021-06-12 NOTE — Progress Notes (Signed)
Cardiology Office Note:    Date:  06/12/2021   ID:  Dana Hall, DOB 1940-10-21, MRN [id number]  PCP:  Dana James, MD  Cardiologist:    Dana Hall   Electrophysiologist:  None   Referring MD: Dana James, MD   Chief Complaint  Patient presents with   Hypertension   Atrial Flutter    Oct. 12, 2020    Dana Hall is a 81 y.o. female with a hx of HTN.  She has had some dizziness I saw her via telemedicine visit in July, 2020 and thought she had orthostatic hypotension. Echo showed normal LV systolic function and grade 2 diastolic dysfunction She has mild Aortic insufficiency  Overall is feeling well BP is a bit high today .   Has been rushing around this am.  Takes her BP at home and typically gets 120's   Remains active .    July 30, 2020 Dana Hall is seen for follow up of her HTN  No cardiac complaints  Works on the treadmill at [employer]   No CP or dyspnea   Nov. 11, 2022 Dana Hall is seen today for follow up of a recent hospitalization. She was hospitalized with a small bowell obstruction  Her hospitalization was complicated by pulmonary embolus and atrial fibrillation .   She was started on Eliquis  She had bilateral large pleural effusions. Had ridght thoracentesis    Last ECG was Oct. 30 Shows atrial flutter   Is still very short of breath ,  difficult to sleep lying down   Dec. 5, 2022: Seen with Dana Hall ( daughter) , and Dana Hall is seen today for follow up of her atrial fib, pulmonary embolus. She has been on Eliquis. Has had bilateral plueral effusions  Has regained most of her strength   She has new TWI in the anterior leads.  LV function could not be assessed in the hospital  We started Losartan and plan to repeat echo .    She has been recording her blood pressure.  Most of her blood pressure readings are in the normal range and some are slightly high.  It looks like we do have room to increase the losartan if needed.  June 12, 2021: At  the seen today for follow-up of her atrial fibrillation, pulmonary embolus and recent acute systolic congestive heart failure. Echocardiogram in December 28 revealed mildly depressed left ventricular systolic function with anterior wall hypokinesis.  Ejection fraction 45 to 50%.  Myoview study revealed an anterior septal defect which could have been due to the left bundle branch block or perhaps a previous myocardial infarction.  She did not want to schedule any further testing or heart catheterization.  She wanted to follow-up today and see how she was doing.  She is feeling much better . Finally getting over her SBO and surgery  BP at home is good  Is back doing her normal activities,     Past Medical History:  Diagnosis Date   Actinic keratosis    Dr.Gould   Anemia    Asthma    as a child   BPPV (benign paroxysmal positional vertigo), unspecified laterality    DDD (degenerative disc disease), lumbar    L5 facet arthritis and foraminal stenosis   Degenerative cervical disc    spondylosis   GERD (gastroesophageal reflux disease)    Hypercholesterolemia    Hypertension 11/21/2011   SBO (small bowel obstruction) (HCC) 11/29/2011   Sensorineural hearing loss (SNHL) of both ears  Strangulation obstruction of intestine (HCC) 01/26/2012   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  EXPLORATORY LAPAROTOMY;  Surgeon: Ernestene Mention, MD;  Location: [facility];  Service: General;  Laterality: N/A;  exploratory laparotomy for bowel obstruction   LAPAROTOMY N/A 02/04/2021   Procedure: EXPLORATORY LAPAROTOMY;  Surgeon: Berna Bue, MD;  Location: [facility];  Service: General;  Laterality: N/A;   LYSIS OF ADHESION N/A 02/04/2021   Procedure: LYSIS OF ADHESION WITH ADHESION BARRIER PLACED;  Surgeon: Berna Bue, MD;  Location: [facility];  Service:  General;  Laterality: N/A;   MULTIPLE TOOTH EXTRACTIONS     age 2    Current Medications: Current Meds  Medication Sig   acetaminophen (TYLENOL) 500 MG tablet Take 500 mg by mouth every 6 (six) hours as needed for mild pain.   apixaban (ELIQUIS) 2.5 MG TABS tablet Take 1 tablet (2.5 mg total) by mouth 2 (two) times daily.   losartan (COZAAR) 50 MG tablet Take 1 tablet (50 mg total) by mouth daily.     Allergies:   Patient has no active allergies.   Social History   Socioeconomic History   Marital status: Married    Spouse name: Not on file   Number of children: 3   Years of education: Not on file   Highest education level: Not on file  Occupational History   Occupation: retired Cabin crew ed Runner, broadcasting/film/video  Tobacco Use   Smoking status: Never   Smokeless tobacco: Never  Vaping Use   Vaping Use: Never used  Substance and Sexual Activity   Alcohol use: Yes   Drug use: No   Sexual activity: Not on file  Other Topics Concern   Not on file  Social History Narrative   Not on file   Social Determinants of Health   Financial Resource Strain: Not on file  Food Insecurity: Not on file  Transportation Needs: Not on file  Physical Activity: Not on file  Stress: Not on file  Social Connections: Not on file     Family History: The patient's family history includes Heart disease in her mother. There is no history of Breast cancer.  ROS:   Please see the history of present illness.     All other systems reviewed and are negative.  EKGs/Labs/Other Studies Reviewed:    The following studies were reviewed today:   Recent Labs: 02/09/2021: B Natriuretic Peptide 3,335.5 02/11/2021: ALT 47; Magnesium 2.0 02/13/2021: Hemoglobin 11.0; Platelets 310 04/10/2021: BUN 21; Creatinine, Ser 0.62; Potassium 4.9; Sodium 138  Recent Lipid Panel    Component Value Date/Time   TRIG 124 02/11/2021 0850    Physical Exam:    Physical Exam: Blood pressure 140/70, pulse 72, height 5\' 7"  (1.702  m), weight 127 lb 3.2 oz (57.7 kg), SpO2 99 %.  GEN:  Well nourished, well developed in no acute distress HEENT: Normal NECK: No JVD; No carotid bruits LYMPHATICS: No lymphadenopathy CARDIAC: RRR , no murmurs, rubs, gallops RESPIRATORY:  Clear to auscultation without rales, wheezing or rhonchi  ABDOMEN: Soft, non-tender, non-distended MUSCULOSKELETAL:  No edema; No deformity  SKIN: Warm and dry NEUROLOGIC:  Alert and oriented x 3   EKG:      ASSESSMENT:    No diagnosis found.    PLAN:      Small bowel obstruction :  She is clearly better from a GI standpoint.  She is now over her surgery and small bowel obstruction.  2.  Atrial fib: associated with the stress of a SBO.  She has converted to NSR.  She remains in normal sinus rhythm.  She seems to be doing well.   3.  Pulmonary embolus :   Its not clear whether her pulmonary embolus was provoked or unprovoked.  I think we should continue with her Eliquis.   4.  Acute systolic CHF:   Echo during her hospitalization was a very difficult study with poor image quality .   EF ~ 40%.   Ant. Wall akinesis. Her EF is improved slightly to 45 to 50%.  She did not want to have a coronary CT angiogram but I think this would be the next step if she has any limitations.  At age 88 we can continue to treat her medically.  At this point we do not know if perhaps she had coronary spasm or perhaps small ACS / infarction     Medication Adjustments/Labs and Tests Ordered: Current medicines are reviewed at length with the patient today.  Concerns regarding medicines are outlined above.  No orders of the defined types were placed in this encounter.    No orders of the defined types were placed in this encounter.      Patient Instructions  Medication Instructions:  Your physician recommends that you continue on your current medications as directed. Please refer to the Current Medication list given to you today.  *If you need a  refill on your cardiac medications before your next appointment, please call your pharmacy*   Lab Work: NONE If you have labs (blood work) drawn today and your tests are completely normal, you will receive your results only by: MyChart Message (if you have MyChart) OR A paper copy in the mail If you have any lab test that is abnormal or we need to change your treatment, we will call you to review the results.   Testing/Procedures: NONE   Follow-Up: At [facility], you and your health needs are our priority.  As part of our continuing mission to provide you with exceptional heart care, we have created designated Provider Care Teams.  These Care Teams include your primary Cardiologist (physician) and Advanced Practice Providers (APPs -  Physician Assistants and Nurse Practitioners) who all work together to provide you with the care you need, when you need it.   Your next appointment:   6 month(s)  The format for your next appointment:   In Person  Provider:   Kristeen Miss, MD  or Eligha Bridegroom, NP or Tereso Newcomer, PA-C          Other Instructions HAPPY BIRTHDAY!    Signed, Kristeen Miss, MD  06/12/2021 10:24 AM    [facility]

## 2021-06-21 DIAGNOSIS — H903 Sensorineural hearing loss, bilateral: Secondary | ICD-10-CM | POA: Diagnosis not present

## 2021-06-21 DIAGNOSIS — H6121 Impacted cerumen, right ear: Secondary | ICD-10-CM | POA: Diagnosis not present

## 2021-08-26 ENCOUNTER — Other Ambulatory Visit: Payer: Self-pay | Admitting: *Deleted

## 2021-08-26 DIAGNOSIS — I484 Atypical atrial flutter: Secondary | ICD-10-CM

## 2021-08-26 DIAGNOSIS — I2609 Other pulmonary embolism with acute cor pulmonale: Secondary | ICD-10-CM

## 2021-08-26 MED ORDER — APIXABAN 2.5 MG PO TABS: 2.5000 mg | ORAL_TABLET | Freq: Two times a day (BID) | ORAL | 1 refills | Status: DC

## 2021-08-26 NOTE — Telephone Encounter (Signed)
Eliquis 2.5mg  refill request received. Patient is 81 years old, weight-57.7kg, Crea-0.62 on 04/10/2021, Diagnosis-Aflutter/Afib & PE, and last seen by Dr. Elease Hashimoto on 06/12/2021. Dose is appropriate based on dosing criteria. Will send in refill to requested pharmacy.   ?

## 2021-09-18 DIAGNOSIS — J40 Bronchitis, not specified as acute or chronic: Secondary | ICD-10-CM | POA: Diagnosis not present

## 2021-09-18 DIAGNOSIS — J0110 Acute frontal sinusitis, unspecified: Secondary | ICD-10-CM | POA: Diagnosis not present

## 2021-09-18 DIAGNOSIS — I10 Essential (primary) hypertension: Secondary | ICD-10-CM | POA: Diagnosis not present

## 2021-12-05 DIAGNOSIS — L570 Actinic keratosis: Secondary | ICD-10-CM | POA: Diagnosis not present

## 2021-12-18 ENCOUNTER — Ambulatory Visit: Payer: Medicare PPO | Attending: Cardiovascular Disease | Admitting: Cardiovascular Disease

## 2021-12-18 ENCOUNTER — Encounter: Payer: Self-pay | Admitting: Cardiovascular Disease

## 2021-12-18 VITALS — BP 135/75 | HR 76 | Ht 67.0 in | Wt 123.0 lb

## 2021-12-18 DIAGNOSIS — I10 Essential (primary) hypertension: Secondary | ICD-10-CM | POA: Diagnosis not present

## 2021-12-18 DIAGNOSIS — I5042 Chronic combined systolic (congestive) and diastolic (congestive) heart failure: Secondary | ICD-10-CM | POA: Diagnosis not present

## 2021-12-18 DIAGNOSIS — I2609 Other pulmonary embolism with acute cor pulmonale: Secondary | ICD-10-CM

## 2021-12-18 NOTE — Patient Instructions (Signed)
Medication Instructions:  Your physician recommends that you continue on your current medications as directed. Please refer to the Current Medication list given to you today.  *If you need a refill on your cardiac medications before your next appointment, please call your pharmacy*   Lab Work: NONE If you have labs (blood work) drawn today and your tests are completely normal, you will receive your results only by: MyChart Message (if you have MyChart) OR A paper copy in the mail If you have any lab test that is abnormal or we need to change your treatment, we will call you to review the results.   Testing/Procedures: NONE   Follow-Up: At [facility], you and your health needs are our priority.  As part of our continuing mission to provide you with exceptional heart care, we have created designated Provider Care Teams.  These Care Teams include your primary Cardiologist (physician) and Advanced Practice Providers (APPs -  Physician Assistants and Nurse Practitioners) who all work together to provide you with the care you need, when you need it.  We recommend signing up for the patient portal called "MyChart".  Sign up information is provided on this After Visit Summary.  MyChart is used to connect with patients for Virtual Visits (Telemedicine).  Patients are able to view lab/test results, encounter notes, upcoming appointments, etc.  Non-urgent messages can be sent to your provider as well.   To learn more about what you can do with MyChart, go to [email or web address].    Your next appointment:   6 month(s)  The format for your next appointment:   In Person  Provider:   Michelle Swinyer, NP       Important Information About Sugar       

## 2021-12-18 NOTE — Progress Notes (Signed)
Cardiology Office Note:    Date:  12/18/2021   ID:  Dana Hall, DOB 1940-12-16, MRN [id number]  PCP:  [name], MD  Cardiologist:    Kalyan Barabas   Electrophysiologist:  None   Referring MD: [name], MD   No chief complaint on file.   Oct. 12, 2020    Dana Hall is a 81 y.o. female with a hx of HTN.  She has had some dizziness I saw her via telemedicine visit in July, 2020 and thought she had orthostatic hypotension. Echo showed normal LV systolic function and grade 2 diastolic dysfunction She has mild Aortic insufficiency  Overall is feeling well BP is a bit high today .   Has been rushing around this am.  Takes her BP at home and typically gets 120's   Remains active .    July 30, 2020 Dana Hall is seen for follow up of her HTN  No cardiac complaints  Works on the treadmill at [employer]   No CP or dyspnea   Nov. 11, 2022 Dana Hall is seen today for follow up of a recent hospitalization. She was hospitalized with a small bowell obstruction  Her hospitalization was complicated by pulmonary embolus and atrial fibrillation .   She was started on Eliquis  She had bilateral large pleural effusions. Had ridght thoracentesis    Last ECG was Oct. 30 Shows atrial flutter   Is still very short of breath ,  difficult to sleep lying down   Dec. 5, 2022: Seen with Dana Hall ( daughter) , and Dana Hall is seen today for follow up of her atrial fib, pulmonary embolus. She has been on Eliquis. Has had bilateral plueral effusions  Has regained most of her strength   She has new TWI in the anterior leads.  LV function could not be assessed in the hospital  We started Losartan and plan to repeat echo .    She has been recording her blood pressure.  Most of her blood pressure readings are in the normal range and some are slightly high.  It looks like we do have room to increase the losartan if needed.  June 12, 2021: At the seen today for follow-up of her atrial  fibrillation, pulmonary embolus and recent acute systolic congestive heart failure. Echocardiogram in December 28 revealed mildly depressed left ventricular systolic function with anterior wall hypokinesis.  Ejection fraction 45 to 50%.  Myoview study revealed an anterior septal defect which could have been due to the left bundle branch block or perhaps a previous myocardial infarction.  She did not want to schedule any further testing or heart catheterization.  She wanted to follow-up today and see how she was doing.  She is feeling much better . Finally getting over her SBO and surgery  BP at home is good  Is back doing her normal activities,    Spet. 6, 2023 Dana Hall is seen today for follow up of her chronic combined CHF ., atrial fib, pulmonary embolus , LBBB   BP is a bit elevated today .  Is feeling much better since last year .   No CP or dyspnea     Past Medical History:  Diagnosis Date   Actinic keratosis    Dr.Gould   Anemia    Asthma    as a child   BPPV (benign paroxysmal positional vertigo), unspecified laterality    DDD (degenerative disc disease), lumbar    L5 facet arthritis and foraminal stenosis  Degenerative cervical disc    spondylosis   GERD (gastroesophageal reflux disease)    Hypercholesterolemia    Hypertension 11/21/2011   SBO (small bowel obstruction) (HCC) 11/29/2011   Sensorineural hearing loss (SNHL) of both ears    Strangulation obstruction of intestine (HCC) 01/26/2012   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  EXPLORATORY LAPAROTOMY;  Surgeon: Ernestene Mention, MD;  Location: [facility];  Service: General;  Laterality: N/A;  exploratory laparotomy for bowel obstruction   LAPAROTOMY N/A 02/04/2021   Procedure: EXPLORATORY LAPAROTOMY;  Surgeon: Berna Bue, MD;  Location: [facility];  Service: General;   Laterality: N/A;   LYSIS OF ADHESION N/A 02/04/2021   Procedure: LYSIS OF ADHESION WITH ADHESION BARRIER PLACED;  Surgeon: Berna Bue, MD;  Location: [facility];  Service: General;  Laterality: N/A;   MULTIPLE TOOTH EXTRACTIONS     age 63    Current Medications: Current Meds  Medication Sig   acetaminophen (TYLENOL) 500 MG tablet Take 500 mg by mouth every 6 (six) hours as needed for mild pain.   apixaban (ELIQUIS) 2.5 MG TABS tablet Take 1 tablet (2.5 mg total) by mouth 2 (two) times daily.   losartan (COZAAR) 50 MG tablet Take 1 tablet (50 mg total) by mouth daily.     Allergies:   Patient has no active allergies.   Social History   Socioeconomic History   Marital status: Married    Spouse name: Not on file   Number of children: 3   Years of education: Not on file   Highest education level: Not on file  Occupational History   Occupation: retired Cabin crew ed Runner, broadcasting/film/video  Tobacco Use   Smoking status: Never   Smokeless tobacco: Never  Vaping Use   Vaping Use: Never used  Substance and Sexual Activity   Alcohol use: Yes   Drug use: No   Sexual activity: Not on file  Other Topics Concern   Not on file  Social History Narrative   Not on file   Social Determinants of Health   Financial Resource Strain: Not on file  Food Insecurity: Not on file  Transportation Needs: Not on file  Physical Activity: Not on file  Stress: Not on file  Social Connections: Not on file     Family History: The patient's family history includes Heart disease in her mother. There is no history of Breast cancer.  ROS:   Please see the history of present illness.     All other systems reviewed and are negative.  EKGs/Labs/Other Studies Reviewed:    The following studies were reviewed today:   Recent Labs: 02/09/2021: B Natriuretic Peptide 3,335.5 02/11/2021: ALT 47; Magnesium 2.0 02/13/2021: Hemoglobin 11.0; Platelets 310 04/10/2021: BUN 21; Creatinine, Ser 0.62; Potassium 4.9; Sodium  138  Recent Lipid Panel    Component Value Date/Time   TRIG 124 02/11/2021 0850    Physical Exam:    Physical Exam: Blood pressure 135/75, pulse 76, height 5\' 7"  (1.702 m), weight 123 lb (55.8 kg).       GEN:  Well nourished, well developed in no acute distress HEENT: Normal NECK: No JVD; No carotid bruits LYMPHATICS: No lymphadenopathy CARDIAC: RRR , soft systolic murmur  RESPIRATORY:  Clear to auscultation without rales, wheezing or rhonchi  ABDOMEN: Soft, non-tender, non-distended MUSCULOSKELETAL:  No edema;  No deformity  SKIN: Warm and dry NEUROLOGIC:  Alert and oriented x 3    EKG:      ASSESSMENT:    No diagnosis found.    PLAN:      Small bowel obstruction :  is getting over  her SBO  Has recovered nicely.     2.  Atrial fib: no further episodes     3.  Pulmonary embolus :   Doing well    4.  Acute systolic CHF:    Seems to be doing well.  Cont meds.  She had an anterior defect on myoview scan ? Due to LBBB vs. Silent ant MI She is doing well.   No CP  She is not interested in having a cath. Ive asked her to let us know if she develops any cp / tightness       Medication Adjustments/Labs and Tests Ordered: Current medicines are reviewed at length with the patient today.  Concerns regarding medicines are outlined above.  No orders of the defined types were placed in this encounter.    No orders of the defined types were placed in this encounter.      There are no Patient Instructions on file for this visit.   Signed, Kristeen Miss, MD  12/18/2021 9:41 AM    [facility]

## 2022-01-09 DIAGNOSIS — M25551 Pain in right hip: Secondary | ICD-10-CM | POA: Diagnosis not present

## 2022-01-16 DIAGNOSIS — H6123 Impacted cerumen, bilateral: Secondary | ICD-10-CM | POA: Diagnosis not present

## 2022-01-16 DIAGNOSIS — H903 Sensorineural hearing loss, bilateral: Secondary | ICD-10-CM | POA: Diagnosis not present

## 2022-01-16 DIAGNOSIS — Z974 Presence of external hearing-aid: Secondary | ICD-10-CM | POA: Diagnosis not present

## 2022-01-22 DIAGNOSIS — M25551 Pain in right hip: Secondary | ICD-10-CM | POA: Diagnosis not present

## 2022-01-22 DIAGNOSIS — M7061 Trochanteric bursitis, right hip: Secondary | ICD-10-CM | POA: Diagnosis not present

## 2022-02-06 DIAGNOSIS — Z961 Presence of intraocular lens: Secondary | ICD-10-CM | POA: Diagnosis not present

## 2022-02-06 DIAGNOSIS — H52203 Unspecified astigmatism, bilateral: Secondary | ICD-10-CM | POA: Diagnosis not present

## 2022-02-22 ENCOUNTER — Other Ambulatory Visit: Payer: Self-pay | Admitting: Cardiovascular Disease

## 2022-02-22 DIAGNOSIS — I484 Atypical atrial flutter: Secondary | ICD-10-CM

## 2022-02-22 DIAGNOSIS — I2609 Other pulmonary embolism with acute cor pulmonale: Secondary | ICD-10-CM

## 2022-02-24 NOTE — Telephone Encounter (Signed)
Eliquis 2.5mg  refill request received. Patient is 81 years old, weight-55.8kg, Crea-0.62 on 04/10/2021, Diagnosis-Aflutter & PE, and last seen by Dr. Elease Hashimoto on 12/18/2021. Dose is appropriate based on dosing criteria. Will send in refill to requested pharmacy.

## 2022-03-26 DIAGNOSIS — E441 Mild protein-calorie malnutrition: Secondary | ICD-10-CM | POA: Diagnosis not present

## 2022-03-26 DIAGNOSIS — I5032 Chronic diastolic (congestive) heart failure: Secondary | ICD-10-CM | POA: Diagnosis not present

## 2022-03-26 DIAGNOSIS — Z1331 Encounter for screening for depression: Secondary | ICD-10-CM | POA: Diagnosis not present

## 2022-03-26 DIAGNOSIS — Z0000 Encounter for general adult medical examination without abnormal findings: Secondary | ICD-10-CM | POA: Diagnosis not present

## 2022-03-26 DIAGNOSIS — I4891 Unspecified atrial fibrillation: Secondary | ICD-10-CM | POA: Diagnosis not present

## 2022-03-26 DIAGNOSIS — H903 Sensorineural hearing loss, bilateral: Secondary | ICD-10-CM | POA: Diagnosis not present

## 2022-03-26 DIAGNOSIS — I700 Atherosclerosis of aorta: Secondary | ICD-10-CM | POA: Diagnosis not present

## 2022-03-26 DIAGNOSIS — D6869 Other thrombophilia: Secondary | ICD-10-CM | POA: Diagnosis not present

## 2022-03-26 DIAGNOSIS — I10 Essential (primary) hypertension: Secondary | ICD-10-CM | POA: Diagnosis not present

## 2022-04-22 ENCOUNTER — Other Ambulatory Visit: Payer: Self-pay | Admitting: Physician Assistant

## 2022-04-22 DIAGNOSIS — K869 Disease of pancreas, unspecified: Secondary | ICD-10-CM

## 2022-04-30 ENCOUNTER — Other Ambulatory Visit: Payer: Self-pay | Admitting: Family Medicine

## 2022-04-30 DIAGNOSIS — Z1231 Encounter for screening mammogram for malignant neoplasm of breast: Secondary | ICD-10-CM

## 2022-05-22 DIAGNOSIS — D225 Melanocytic nevi of trunk: Secondary | ICD-10-CM | POA: Diagnosis not present

## 2022-05-22 DIAGNOSIS — L578 Other skin changes due to chronic exposure to nonionizing radiation: Secondary | ICD-10-CM | POA: Diagnosis not present

## 2022-05-22 DIAGNOSIS — L570 Actinic keratosis: Secondary | ICD-10-CM | POA: Diagnosis not present

## 2022-05-22 DIAGNOSIS — L821 Other seborrheic keratosis: Secondary | ICD-10-CM | POA: Diagnosis not present

## 2022-05-22 DIAGNOSIS — L723 Sebaceous cyst: Secondary | ICD-10-CM | POA: Diagnosis not present

## 2022-05-22 DIAGNOSIS — D239 Other benign neoplasm of skin, unspecified: Secondary | ICD-10-CM | POA: Diagnosis not present

## 2022-06-22 ENCOUNTER — Encounter: Payer: Self-pay | Admitting: Cardiovascular Disease

## 2022-06-22 NOTE — Progress Notes (Signed)
Cardiology Office Note:    Date:  06/25/2022   ID:  Dana Hall, DOB 09-12-1940, MRN [id number]  PCP:  [name], MD  Cardiologist:    Nafisah Runions   Electrophysiologist:  None   Referring MD: [name], MD   Chief Complaint  Patient presents with   Congestive Heart Failure        Hypertension         Oct. 12, 2020    Dana Hall is a 82 y.o. female with a hx of HTN.  She has had some dizziness I saw her via telemedicine visit in July, 2020 and thought she had orthostatic hypotension. Echo showed normal LV systolic function and grade 2 diastolic dysfunction She has mild Aortic insufficiency  Overall is feeling well BP is a bit high today .   Has been rushing around this am.  Takes her BP at home and typically gets 120's   Remains active .    July 30, 2020 Dana Hall is seen for follow up of her HTN  No cardiac complaints  Works on the treadmill at [employer]   No CP or dyspnea   Nov. 11, 2022 Dana Hall is seen today for follow up of a recent hospitalization. She was hospitalized with a small bowell obstruction  Her hospitalization was complicated by pulmonary embolus and atrial fibrillation .   She was started on Eliquis  She had bilateral large pleural effusions. Had ridght thoracentesis    Last ECG was Oct. 30 Shows atrial flutter   Is still very short of breath ,  difficult to sleep lying down   Dec. 5, 2022: Seen with Dana Hall ( daughter) , and Dana Hall is seen today for follow up of her atrial fib, pulmonary embolus. She has been on Eliquis. Has had bilateral plueral effusions  Has regained most of her strength   She has new TWI in the anterior leads.  LV function could not be assessed in the hospital  We started Losartan and plan to repeat echo .    She has been recording her blood pressure.  Most of her blood pressure readings are in the normal range and some are slightly high.  It looks like we do have room to increase the losartan if  needed.  June 12, 2021: At the seen today for follow-up of her atrial fibrillation, pulmonary embolus and recent acute systolic congestive heart failure. Echocardiogram in December 28 revealed mildly depressed left ventricular systolic function with anterior wall hypokinesis.  Ejection fraction 45 to 50%.  Myoview study revealed an anterior septal defect which could have been due to the left bundle branch block or perhaps a previous myocardial infarction.  She did not want to schedule any further testing or heart catheterization.  She wanted to follow-up today and see how she was doing.  She is feeling much better . Finally getting over her SBO and surgery  BP at home is good  Is back doing her normal activities,    Spet. 6, 2023 Dana Hall is seen today for follow up of her chronic combined CHF ., atrial fib, pulmonary embolus , LBBB   BP is a bit elevated today .  Is feeling much better since last year .   No CP or dyspnea    June 25, 2022 Dana Hall is seen for follow up for her chronic combined CHF, atrial fib, pulmonary embolus, LBBB   Echocardiogram from December, 2022 reveals LVEF of 45 to 50%. Myoview study revealed some  anterior septal perfusion defect thought to be possibly due to her left bundle branch block.  She did not want to have a heart cath.        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  EXPLORATORY LAPAROTOMY;  Surgeon: Ernestene Mention, MD;  Location: [facility];  Service: General;  Laterality: N/A;  exploratory laparotomy for bowel obstruction   LAPAROTOMY N/A 02/04/2021   Procedure: EXPLORATORY LAPAROTOMY;  Surgeon: Berna Bue, MD;  Location: [facility];  Service: General;  Laterality: N/A;   LYSIS OF ADHESION N/A 02/04/2021   Procedure: LYSIS OF ADHESION WITH ADHESION BARRIER PLACED;  Surgeon: Berna Bue, MD;  Location: [facility];  Service: General;  Laterality: N/A;   MULTIPLE TOOTH EXTRACTIONS     age 6    Current Medications: Current Meds  Medication Sig   acetaminophen (TYLENOL) 500 MG tablet Take 500 mg by mouth every 6 (six) hours as needed for mild pain.   ELIQUIS 2.5 MG TABS tablet TAKE 1 TABLET BY MOUTH TWICE A DAY   losartan (COZAAR) 50 MG tablet TAKE 1 TABLET BY MOUTH EVERY DAY     Allergies:   Patient has no active allergies.   Social History   Socioeconomic History   Marital status: Married    Spouse name: Not on file   Number of children: 3   Years of education: Not on file   Highest education level: Not on file  Occupational History   Occupation: retired Cabin crew ed Runner, broadcasting/film/video  Tobacco Use   Smoking status: Never   Smokeless tobacco: Never  Vaping Use   Vaping Use: Never used  Substance and Sexual Activity   Alcohol use: Yes   Drug use: No   Sexual activity: Not on file  Other Topics Concern   Not on file  Social History Narrative   Not on file   Social Determinants of Health   Financial Resource Strain: Not on file  Food Insecurity: Not on file  Transportation Needs: Not on file  Physical Activity: Not on file  Stress: Not on file  Social Connections: Not on file     Family History: The patient's family history includes Heart disease in her mother. There is no history of Breast cancer.  ROS:   Please  see the history of present illness.     All other systems reviewed and are negative.  EKGs/Labs/Other Studies Reviewed:    The following studies were reviewed today:   Recent Labs: No results found for requested labs within last 365 days.  Recent Lipid Panel    Component Value Date/Time   TRIG 124 02/11/2021 0850    Physical Exam:     Physical Exam: Blood pressure Marland Kitchen)  120/58, pulse 73, height 5\' 6"  (1.676 m), weight 127 lb (57.6 kg), SpO2 99 %.       GEN:  Well nourished, well developed in no acute distress HEENT: Normal NECK: No JVD; No carotid bruits LYMPHATICS: No lymphadenopathy CARDIAC: RRR , no murmurs, rubs, gallops RESPIRATORY:  Clear to auscultation without rales, wheezing or rhonchi  ABDOMEN: Soft, non-tender, non-distended MUSCULOSKELETAL:  No edema; No deformity  SKIN: Warm and dry NEUROLOGIC:  Alert and oriented x 3     EKG: June 25, 2022: Normal sinus rhythm at 73.  Left bundle branch block.  No changes from previous EKG.    ASSESSMENT:    No diagnosis found.    PLAN:       LBBB :  EF is 45-50 %   2.  Atrial fib: No episodes of recurrent atrial fibrillation.    3.  Pulmonary embolus :    continue Eliquis 2.5 BID        4.  Acute systolic CHF: Her heart function is much better.  She is healed up nicely following her acute hospitalization for small bowel obstruction.  Myoview showed a small ant. Septal fixed defect.    Possibly due to her LBBB .   We discussed cardiac cath but she did not want to have a cath /.  She is not having any symptoms            Medication Adjustments/Labs and Tests Ordered: Current medicines are reviewed at length with the patient today.  Concerns regarding medicines are outlined above.  No orders of the defined types were placed in this encounter.    No orders of the defined types were placed in this encounter.      There are no Patient Instructions on file for this visit.    Signed, Kristeen Miss, MD  06/25/2022 2:32 PM    [facility]

## 2022-06-23 ENCOUNTER — Ambulatory Visit
Admission: RE | Admit: 2022-06-23 | Discharge: 2022-06-23 | Disposition: A | Discharge status: Home / Self Care | Payer: Medicare PPO | Source: Ambulatory Visit | Attending: Family Medicine | Admitting: Family Medicine

## 2022-06-23 DIAGNOSIS — Z1231 Encounter for screening mammogram for malignant neoplasm of breast: Secondary | ICD-10-CM

## 2022-06-25 ENCOUNTER — Encounter: Payer: Self-pay | Admitting: Cardiovascular Disease

## 2022-06-25 ENCOUNTER — Ambulatory Visit: Payer: Medicare PPO | Attending: Cardiovascular Disease | Admitting: Cardiovascular Disease

## 2022-06-25 VITALS — BP 120/58 | HR 73 | Ht 66.0 in | Wt 127.0 lb

## 2022-06-25 DIAGNOSIS — I5042 Chronic combined systolic (congestive) and diastolic (congestive) heart failure: Secondary | ICD-10-CM

## 2022-06-25 DIAGNOSIS — I447 Left bundle-branch block, unspecified: Secondary | ICD-10-CM

## 2022-06-25 NOTE — Patient Instructions (Signed)
Medication Instructions:  Your physician recommends that you continue on your current medications as directed. Please refer to the Current Medication list given to you today.  *If you need a refill on your cardiac medications before your next appointment, please call your pharmacy*   Lab Work: NONE If you have labs (blood work) drawn today and your tests are completely normal, you will receive your results only by: MyChart Message (if you have MyChart) OR A paper copy in the mail If you have any lab test that is abnormal or we need to change your treatment, we will call you to review the results.   Testing/Procedures: NONE   Follow-Up: At [facility], you and your health needs are our priority.  As part of our continuing mission to provide you with exceptional heart care, we have created designated Provider Care Teams.  These Care Teams include your primary Cardiologist (physician) and Advanced Practice Providers (APPs -  Physician Assistants and Nurse Practitioners) who all work together to provide you with the care you need, when you need it.  We recommend signing up for the patient portal called "MyChart".  Sign up information is provided on this After Visit Summary.  MyChart is used to connect with patients for Virtual Visits (Telemedicine).  Patients are able to view lab/test results, encounter notes, upcoming appointments, etc.  Non-urgent messages can be sent to your provider as well.   To learn more about what you can do with MyChart, go to [email or web address].    Your next appointment:   1 year(s)  Provider:   Philip Nahser, MD   

## 2022-07-04 DIAGNOSIS — H6123 Impacted cerumen, bilateral: Secondary | ICD-10-CM | POA: Diagnosis not present

## 2022-07-04 DIAGNOSIS — H903 Sensorineural hearing loss, bilateral: Secondary | ICD-10-CM | POA: Diagnosis not present

## 2022-07-04 DIAGNOSIS — Z01118 Encounter for examination of ears and hearing with other abnormal findings: Secondary | ICD-10-CM | POA: Diagnosis not present

## 2022-07-23 ENCOUNTER — Ambulatory Visit
Admission: RE | Admit: 2022-07-23 | Discharge: 2022-07-23 | Disposition: A | Discharge status: Home / Self Care | Payer: Medicare PPO | Source: Ambulatory Visit | Attending: Physician Assistant | Admitting: Physician Assistant

## 2022-07-23 DIAGNOSIS — K869 Disease of pancreas, unspecified: Secondary | ICD-10-CM

## 2022-07-23 DIAGNOSIS — K862 Cyst of pancreas: Secondary | ICD-10-CM | POA: Diagnosis not present

## 2022-07-23 DIAGNOSIS — I700 Atherosclerosis of aorta: Secondary | ICD-10-CM | POA: Diagnosis not present

## 2022-07-23 DIAGNOSIS — K8020 Calculus of gallbladder without cholecystitis without obstruction: Secondary | ICD-10-CM | POA: Diagnosis not present

## 2022-07-23 DIAGNOSIS — R918 Other nonspecific abnormal finding of lung field: Secondary | ICD-10-CM | POA: Diagnosis not present

## 2022-07-23 MED ORDER — IOPAMIDOL (ISOVUE-370) INJECTION 76%
80.0000 mL | Freq: Once | INTRAVENOUS | Status: AC | PRN
  Administered 2022-07-23: 80 mL via INTRAVENOUS

## 2022-08-26 ENCOUNTER — Other Ambulatory Visit: Payer: Self-pay | Admitting: *Deleted

## 2022-08-26 DIAGNOSIS — I2609 Other pulmonary embolism with acute cor pulmonale: Secondary | ICD-10-CM

## 2022-08-26 DIAGNOSIS — I484 Atypical atrial flutter: Secondary | ICD-10-CM

## 2022-08-26 MED ORDER — APIXABAN 2.5 MG PO TABS: 2.5000 mg | ORAL_TABLET | Freq: Two times a day (BID) | ORAL | 2 refills | Status: DC

## 2022-12-26 ENCOUNTER — Ambulatory Visit
Admission: EM | Admit: 2022-12-26 | Discharge: 2022-12-26 | Disposition: A | Discharge status: Home / Self Care | Payer: Medicare PPO | Attending: Internal Medicine | Admitting: Internal Medicine

## 2022-12-26 DIAGNOSIS — T07XXXA Unspecified multiple injuries, initial encounter: Secondary | ICD-10-CM | POA: Diagnosis not present

## 2022-12-26 DIAGNOSIS — Z23 Encounter for immunization: Secondary | ICD-10-CM

## 2022-12-26 DIAGNOSIS — S0181XA Laceration without foreign body of other part of head, initial encounter: Secondary | ICD-10-CM | POA: Diagnosis not present

## 2022-12-26 DIAGNOSIS — W19XXXA Unspecified fall, initial encounter: Secondary | ICD-10-CM

## 2022-12-26 DIAGNOSIS — R519 Headache, unspecified: Secondary | ICD-10-CM

## 2022-12-26 MED ORDER — TETANUS-DIPHTH-ACELL PERTUSSIS 5-2.5-18.5 LF-MCG/0.5 IM SUSY
0.5000 mL | PREFILLED_SYRINGE | Freq: Once | INTRAMUSCULAR | Status: AC
  Administered 2022-12-26: 0.5 mL via INTRAMUSCULAR

## 2022-12-26 NOTE — ED Triage Notes (Addendum)
Pt states she tripped[place] curb [place] ~945am-fell on asphalt-small lac/abrasion to left later eye area/oozing-denies LOC-abrasion to left knee, right thumb and left thumb also with skin tear-NAD-steady gait-all areas cleaned

## 2022-12-26 NOTE — ED Provider Notes (Signed)
[facility]  Note:  This document was prepared using Conservation officer, historic buildings and may include unintentional dictation errors.  MRN: [id number] DOB: [birth year]  Subjective:   Dana Hall is a 82 y.o. female presenting for an evaluation for concussion, abrasions and lacerations. Patient was at church, had suffered an accidental fall trying to step off the curb.  Patient ended up falling onto pavement.  This caused a laceration to the left thumb, left side of her face near her eye, abrasions on her left knee.  No loss of consciousness, confusion, double or blurred vision, severe headache, dizziness, nausea, vomiting, numbness or tingling, speech difficulty.  Cannot look at her last Tdap.  She is on Eliquis.  No current facility-administered medications for this encounter.  Current Outpatient Medications:    acetaminophen (TYLENOL) 500 MG tablet, Take 500 mg by mouth every 6 (six) hours as needed for mild pain., Disp: , Rfl:    apixaban (ELIQUIS) 2.5 MG TABS tablet, Take 1 tablet (2.5 mg total) by mouth 2 (two) times daily., Disp: 180 tablet, Rfl: 2   losartan (COZAAR) 50 MG tablet, TAKE 1 TABLET BY MOUTH EVERY DAY, Disp: 90 tablet, Rfl: 3   No Known Allergies  Past Medical History:  Diagnosis Date   Actinic keratosis    Dr.Gould   Anemia    Asthma    as a child   BPPV (benign paroxysmal positional vertigo), unspecified laterality    DDD (degenerative disc disease), lumbar    L5 facet arthritis and foraminal stenosis   Degenerative cervical disc    spondylosis   GERD (gastroesophageal reflux disease)    Hypercholesterolemia    Hypertension 11/21/2011   SBO (small bowel obstruction) (HCC) 11/29/2011   Sensorineural hearing loss (SNHL) of both ears    Strangulation obstruction of intestine (HCC) 01/26/2012   Strangulation of small intestine (HCC) 11/20/2011     Past Surgical History:  Procedure Laterality Date   APPENDECTOMY     LAPAROTOMY   11/20/2011   Procedure: SB resection & repair of hernia.  EXPLORATORY LAPAROTOMY;  Surgeon: Ernestene Mention, MD;  Location: [facility];  Service: General;  Laterality: N/A;  exploratory laparotomy for bowel obstruction   LAPAROTOMY N/A 02/04/2021   Procedure: EXPLORATORY LAPAROTOMY;  Surgeon: Berna Bue, MD;  Location: [facility];  Service: General;  Laterality: N/A;   LYSIS OF ADHESION N/A 02/04/2021   Procedure: LYSIS OF ADHESION WITH ADHESION BARRIER PLACED;  Surgeon: Berna Bue, MD;  Location: [facility];  Service: General;  Laterality: N/A;   MULTIPLE TOOTH EXTRACTIONS     age 43    Family History  Problem Relation Age of Onset   Heart disease Mother    Breast cancer Neg Hx     Social History   Tobacco Use   Smoking status: Never   Smokeless tobacco: Never  Vaping Use   Vaping status: Never Used  Substance Use Topics   Alcohol use: Yes    Comment: rare   Drug use: No    ROS   Objective:   Vitals: BP (!) 156/78 (BP Location: Left Arm)   Pulse 86   Temp 99 F (37.2 C) (Oral)   Resp 20   SpO2 97%   Physical Exam Constitutional:      General: She is not in acute distress.    Appearance: Normal appearance. She is well-developed and normal weight. She is not ill-appearing, toxic-appearing or diaphoretic.  HENT:  Head: Normocephalic and atraumatic.      Right Ear: Tympanic membrane, ear canal and external ear normal. No drainage or tenderness. No middle ear effusion. There is no impacted cerumen. Tympanic membrane is not erythematous or bulging.     Left Ear: Tympanic membrane, ear canal and external ear normal. No drainage or tenderness.  No middle ear effusion. There is no impacted cerumen. Tympanic membrane is not erythematous or bulging.     Nose: Nose normal. No congestion or rhinorrhea.     Mouth/Throat:     Mouth: Mucous membranes are moist. No oral lesions.     Pharynx: No pharyngeal swelling, oropharyngeal exudate, posterior oropharyngeal erythema or  uvula swelling.     Tonsils: No tonsillar exudate or tonsillar abscesses.  Eyes:     General: No scleral icterus.       Right eye: No discharge.        Left eye: No discharge.     Extraocular Movements: Extraocular movements intact.     Right eye: Normal extraocular motion.     Left eye: Normal extraocular motion.     Conjunctiva/sclera: Conjunctivae normal.  Neck:     Meningeal: Brudzinski's sign and Kernig's sign absent.  Cardiovascular:     Rate and Rhythm: Normal rate.  Pulmonary:     Effort: Pulmonary effort is normal.  Musculoskeletal:       Arms:     Cervical back: Normal range of motion and neck supple.       Legs:  Lymphadenopathy:     Cervical: No cervical adenopathy.  Skin:    General: Skin is warm and dry.  Neurological:     General: No focal deficit present.     Mental Status: She is alert and oriented to person, place, and time.     Cranial Nerves: No cranial nerve deficit, dysarthria or facial asymmetry.     Motor: No weakness or pronator drift.     Coordination: Romberg sign negative. Coordination normal. Finger-Nose-Finger Test and Heel to [facility] Test normal. Rapid alternating movements normal.     Gait: Gait and tandem walk normal.     Deep Tendon Reflexes: Reflexes normal.  Psychiatric:        Mood and Affect: Mood normal.        Behavior: Behavior normal.        Thought Content: Thought content normal.        Judgment: Judgment normal.    PROCEDURE NOTE: laceration repair Verbal consent obtained from patient.  Local anesthesia with 3cc Lidocaine 2% with epinephrine.  Wound explored for tendon, ligament damage. Wound scrubbed with soap and water and rinsed. Wound closed with #2 5-0 Prolene (simple interrupted) sutures.  Wound cleansed and dressed.  Dressings applied to the remaining abrasions of the left thumb and left knee.  Tdap updated in clinic.  Assessment and Plan :   PDMP not reviewed this encounter.  1. Facial pain   2. Facial laceration,  initial encounter   3. Multiple abrasions   4. Accidental fall, initial encounter    No signs of an acute intracranial injury.  Recommended maintaining strict ER precautions.  Reviewed wound care, recommended Tylenol for pain.  Laceration was successfully repaired.  Return to clinic in 6 days (patient's first available day) for suture removal.  Counseled patient on potential for adverse effects with medications prescribed/recommended today, ER and return-to-clinic precautions discussed, patient verbalized understanding.    Wallis Bamberg, PA-C 12/26/22 1357

## 2022-12-26 NOTE — Discharge Instructions (Signed)
WOUND CARE Please return in 6 days to have your stitches/staples removed or sooner if you have concerns. Marland Kitchen Keep area clean and dry for 24 hours. Do not remove bandage, if applied. . After 24 hours, remove bandage and wash wound gently with mild soap and warm water. Reapply a new bandage after cleaning wound, if directed. . Continue daily cleansing with soap and water until stitches/staples are removed. . Do not apply any ointments or creams to the wound while stitches/staples are in place, as this may cause delayed healing. . Notify the office if you experience any of the following signs of infection: Swelling, redness, pus drainage, streaking, fever >101.0 F . Notify the office if you experience excessive bleeding that does not stop after 15-20 minutes of constant, firm pressure.

## 2022-12-29 DIAGNOSIS — S0012XA Contusion of left eyelid and periocular area, initial encounter: Secondary | ICD-10-CM | POA: Diagnosis not present

## 2022-12-31 ENCOUNTER — Ambulatory Visit
Admission: RE | Admit: 2022-12-31 | Discharge: 2022-12-31 | Disposition: A | Discharge status: Home / Self Care | Payer: Medicare PPO | Source: Ambulatory Visit | Attending: Family Medicine | Admitting: Family Medicine

## 2022-12-31 NOTE — ED Triage Notes (Signed)
Pt presents for suture removal.

## 2023-01-01 ENCOUNTER — Ambulatory Visit: Payer: Medicare PPO

## 2023-01-01 DIAGNOSIS — H903 Sensorineural hearing loss, bilateral: Secondary | ICD-10-CM | POA: Diagnosis not present

## 2023-01-01 DIAGNOSIS — H6123 Impacted cerumen, bilateral: Secondary | ICD-10-CM | POA: Diagnosis not present

## 2023-01-01 DIAGNOSIS — H8110 Benign paroxysmal vertigo, unspecified ear: Secondary | ICD-10-CM | POA: Diagnosis not present

## 2023-02-25 DIAGNOSIS — Z961 Presence of intraocular lens: Secondary | ICD-10-CM | POA: Diagnosis not present

## 2023-02-25 DIAGNOSIS — H353111 Nonexudative age-related macular degeneration, right eye, early dry stage: Secondary | ICD-10-CM | POA: Diagnosis not present

## 2023-02-25 DIAGNOSIS — H52203 Unspecified astigmatism, bilateral: Secondary | ICD-10-CM | POA: Diagnosis not present

## 2023-03-06 DIAGNOSIS — M25551 Pain in right hip: Secondary | ICD-10-CM | POA: Diagnosis not present

## 2023-03-06 DIAGNOSIS — M25552 Pain in left hip: Secondary | ICD-10-CM | POA: Diagnosis not present

## 2023-03-06 DIAGNOSIS — M545 Low back pain, unspecified: Secondary | ICD-10-CM | POA: Insufficient documentation

## 2023-03-23 DIAGNOSIS — M533 Sacrococcygeal disorders, not elsewhere classified: Secondary | ICD-10-CM | POA: Diagnosis not present

## 2023-03-25 DIAGNOSIS — I5032 Chronic diastolic (congestive) heart failure: Secondary | ICD-10-CM | POA: Diagnosis not present

## 2023-03-25 DIAGNOSIS — I4891 Unspecified atrial fibrillation: Secondary | ICD-10-CM | POA: Diagnosis not present

## 2023-03-25 DIAGNOSIS — I10 Essential (primary) hypertension: Secondary | ICD-10-CM | POA: Diagnosis not present

## 2023-03-25 DIAGNOSIS — M533 Sacrococcygeal disorders, not elsewhere classified: Secondary | ICD-10-CM | POA: Diagnosis not present

## 2023-03-25 DIAGNOSIS — I700 Atherosclerosis of aorta: Secondary | ICD-10-CM | POA: Diagnosis not present

## 2023-03-25 DIAGNOSIS — M542 Cervicalgia: Secondary | ICD-10-CM | POA: Diagnosis not present

## 2023-03-25 DIAGNOSIS — M51369 Other intervertebral disc degeneration, lumbar region without mention of lumbar back pain or lower extremity pain: Secondary | ICD-10-CM | POA: Diagnosis not present

## 2023-03-25 DIAGNOSIS — D6869 Other thrombophilia: Secondary | ICD-10-CM | POA: Diagnosis not present

## 2023-04-06 ENCOUNTER — Other Ambulatory Visit: Payer: Self-pay

## 2023-04-06 MED ORDER — LOSARTAN POTASSIUM 50 MG PO TABS: 50.0000 mg | ORAL_TABLET | Freq: Every day | ORAL | 0 refills | Status: DC

## 2023-05-06 DIAGNOSIS — Z23 Encounter for immunization: Secondary | ICD-10-CM | POA: Diagnosis not present

## 2023-05-06 DIAGNOSIS — I4891 Unspecified atrial fibrillation: Secondary | ICD-10-CM | POA: Diagnosis not present

## 2023-05-06 DIAGNOSIS — Z0000 Encounter for general adult medical examination without abnormal findings: Secondary | ICD-10-CM | POA: Diagnosis not present

## 2023-05-06 DIAGNOSIS — I10 Essential (primary) hypertension: Secondary | ICD-10-CM | POA: Diagnosis not present

## 2023-05-06 DIAGNOSIS — I5032 Chronic diastolic (congestive) heart failure: Secondary | ICD-10-CM | POA: Diagnosis not present

## 2023-05-06 DIAGNOSIS — Z1331 Encounter for screening for depression: Secondary | ICD-10-CM | POA: Diagnosis not present

## 2023-05-06 LAB — LAB REPORT - SCANNED
Calcium: 9.7
EGFR: 87

## 2023-05-08 ENCOUNTER — Telehealth: Payer: Self-pay | Admitting: Cardiovascular Disease

## 2023-05-08 NOTE — Telephone Encounter (Signed)
Spoke with patient and she states she had labs done at PCP office. She would like Dr. Elease Hashimoto to review.   She will have PCP send labs to our office

## 2023-05-08 NOTE — Telephone Encounter (Signed)
Pt is requesting a callback from nurse regarding her seeing her pcp recently and had labs done but she'd like to know the best way to get them to her MD. Please advise.

## 2023-05-26 ENCOUNTER — Other Ambulatory Visit: Payer: Self-pay | Admitting: Family Medicine

## 2023-05-26 DIAGNOSIS — Z1231 Encounter for screening mammogram for malignant neoplasm of breast: Secondary | ICD-10-CM

## 2023-05-28 DIAGNOSIS — D225 Melanocytic nevi of trunk: Secondary | ICD-10-CM | POA: Diagnosis not present

## 2023-05-28 DIAGNOSIS — D239 Other benign neoplasm of skin, unspecified: Secondary | ICD-10-CM | POA: Diagnosis not present

## 2023-05-28 DIAGNOSIS — L821 Other seborrheic keratosis: Secondary | ICD-10-CM | POA: Diagnosis not present

## 2023-05-28 DIAGNOSIS — L578 Other skin changes due to chronic exposure to nonionizing radiation: Secondary | ICD-10-CM | POA: Diagnosis not present

## 2023-05-28 DIAGNOSIS — L570 Actinic keratosis: Secondary | ICD-10-CM | POA: Diagnosis not present

## 2023-05-28 DIAGNOSIS — L723 Sebaceous cyst: Secondary | ICD-10-CM | POA: Diagnosis not present

## 2023-06-05 ENCOUNTER — Other Ambulatory Visit: Payer: Self-pay

## 2023-06-05 ENCOUNTER — Encounter (HOSPITAL_BASED_OUTPATIENT_CLINIC_OR_DEPARTMENT_OTHER): Payer: Self-pay

## 2023-06-05 ENCOUNTER — Emergency Department (HOSPITAL_BASED_OUTPATIENT_CLINIC_OR_DEPARTMENT_OTHER)
Admission: EM | Admit: 2023-06-05 | Discharge: 2023-06-05 | Disposition: A | Discharge status: Home / Self Care | Payer: Medicare PPO | Attending: Emergency Medicine | Admitting: Emergency Medicine

## 2023-06-05 DIAGNOSIS — Z7901 Long term (current) use of anticoagulants: Secondary | ICD-10-CM | POA: Diagnosis not present

## 2023-06-05 DIAGNOSIS — R040 Epistaxis: Secondary | ICD-10-CM | POA: Diagnosis not present

## 2023-06-05 DIAGNOSIS — Z79899 Other long term (current) drug therapy: Secondary | ICD-10-CM | POA: Insufficient documentation

## 2023-06-05 MED ORDER — LOSARTAN POTASSIUM 25 MG PO TABS
50.0000 mg | ORAL_TABLET | Freq: Once | ORAL | Status: AC
  Administered 2023-06-05: 50 mg via ORAL
  Filled 2023-06-05: qty 2

## 2023-06-05 MED ORDER — OXYMETAZOLINE HCL 0.05 % NA SOLN
1.0000 | Freq: Once | NASAL | Status: AC
  Administered 2023-06-05: 1 via NASAL
  Filled 2023-06-05: qty 30

## 2023-06-05 MED ORDER — LOSARTAN POTASSIUM 50 MG PO TABS: 50.0000 mg | ORAL_TABLET | Freq: Two times a day (BID) | ORAL | 0 refills | Status: DC

## 2023-06-05 NOTE — ED Triage Notes (Signed)
 In for eval of epistaxis onset 0715 this am. Currently on Eliquis. Denies dizziness or lightheadedness.

## 2023-06-05 NOTE — ED Provider Notes (Signed)
 [place] EMERGENCY DEPARTMENT AT [facility] Provider Note   CSN: [id number] Arrival date & time: 06/05/23  4098     History  Chief Complaint  Patient presents with   Epistaxis    Dana Hall is a 83 y.o. female.  83 year old female on Eliquis for atrial flutter here today for epistaxis.  Symptoms began at 715 this morning.  She has been holding ice to the area.   Epistaxis      Home Medications Prior to Admission medications   Medication Sig Start Date End Date Taking? Authorizing Provider  acetaminophen (TYLENOL) 500 MG tablet Take 500 mg by mouth every 6 (six) hours as needed for mild pain.   Yes [provider]  apixaban (ELIQUIS) 2.5 MG TABS tablet Take 1 tablet (2.5 mg total) by mouth 2 (two) times daily. 08/26/22  Yes Nahser, Deloris Ping, MD  losartan (COZAAR) 50 MG tablet Take 1 tablet (50 mg total) by mouth daily. Please keep scheduled appointment for future refills. Thank you. 04/06/23  Yes Nahser, Deloris Ping, MD      Allergies    Patient has no known allergies.    Review of Systems   Review of Systems  HENT:  Positive for nosebleeds.     Physical Exam Updated Vital Signs BP (!) 193/99 (BP Location: Right Arm)   Pulse 98   Temp 97.6 F (36.4 C) (Oral)   Resp 17   Ht 5' 6.5" (1.689 m)   Wt 56.7 kg   SpO2 99%   BMI 19.87 kg/m  Physical Exam Vitals reviewed.  HENT:     Nose:     Comments: Bleeding from the left nares.  In the oropharynx, no posterior bleeding. Skin:    General: Skin is warm.  Neurological:     General: No focal deficit present.     Mental Status: She is alert.     ED Results / Procedures / Treatments   Labs (all labs ordered are listed, but only abnormal results are displayed) Labs Reviewed - No data to display  EKG None  Radiology No results found.  Procedures Procedures    Medications Ordered in ED Medications  oxymetazoline (AFRIN) 0.05 % nasal spray 1 spray (has no administration in  time range)    ED Course/ Medical Decision Making/ A&P                                 Medical Decision Making 83 year old female here today for nosebleed.  Plan-sprayed Afrin up the patient's nose, currently having direct pressure held.  Will reassess.  Vital signs normal aside from some hypertension which I do believe could be a combination of stress, and not having her morning meds.  Reassessment 10:15 AM-patient's nosebleed has stopped.  She now has a nose clip to use, Afrin to go home with.  Patient states that she noticed over the last 1 month her blood pressure has been elevated.  I am instructed her to increase her ARB to 2 times daily.  She is going to follow-up with her PCP.  Considered labs in this patient but it would not change management.  No indication for imaging.  Risk OTC drugs. Prescription drug management.          Final Clinical Impression(s) / ED Diagnoses Final diagnoses:  None    Rx / DC Orders ED Discharge Orders     None  Anders Simmonds T, DO 06/05/23 1027

## 2023-06-05 NOTE — ED Notes (Signed)
 Patient lying in bed with no signs of bleeding. Patient has no complaints of dizziness. Family at bedside, wash cloth was given to clean blood off face and hands.

## 2023-06-05 NOTE — Discharge Instructions (Addendum)
 If you experience another nosebleed, you can apply direct pressure to the area using the nose clip.  You can spray some Afrin up there as well, and wait 30 minutes.  If you continue to have a nosebleed after that, you can come back to the emergency room for further treatment.  I have sent you additional losartan, your blood pressure medication.  You can take 50 mg in the morning and 50 mg in the evening.  Please call your primary care doctor to schedule appointment for next week to discuss your blood pressure management.

## 2023-06-06 ENCOUNTER — Other Ambulatory Visit: Payer: Self-pay | Admitting: Cardiovascular Disease

## 2023-06-06 DIAGNOSIS — I2609 Other pulmonary embolism with acute cor pulmonale: Secondary | ICD-10-CM

## 2023-06-06 DIAGNOSIS — I484 Atypical atrial flutter: Secondary | ICD-10-CM

## 2023-06-08 ENCOUNTER — Telehealth: Payer: Self-pay | Admitting: Cardiovascular Disease

## 2023-06-08 NOTE — Telephone Encounter (Signed)
 Called and spoke with patient about recent nosebleed and ER visit. BP this morning one hour after Losartan 50mg  reports " it was good, 136/something." She states she is taking 50mg  in the morning and 50 again in the evening. She will continue to monitor BP. Has follow-up appt scheduled with Nahser on 06/23/23. Gave hypotension symptoms to monitor for.

## 2023-06-08 NOTE — Telephone Encounter (Signed)
 Spoke with patient and she states she had a nose bleed and while in the ED the provider increased her losartan from 50 mg daily to 50 mg BID.  She would like to know what you think about her taking BID

## 2023-06-08 NOTE — Telephone Encounter (Signed)
 Pt c/o medication issue:  1. Name of Medication: losartan (COZAAR) 50 MG tablet   2. How are you currently taking this medication (dosage and times per day)? One tablet twice daily  3. Are you having a reaction (difficulty breathing--STAT)? No   4. What is your medication issue? Patient is calling stating this medication was increased at the ED. Just wanted to run this by her cardiologist.  In addition to this I made the pt aware she is needing labs per her Eliquis refill note, but there is no active orders at this time. Patient would like to do the labs a few days prior to 03/11 visit so the results are in at her appt. Please advise.

## 2023-06-08 NOTE — Telephone Encounter (Signed)
 Prescription refill request for Eliquis received. Indication:aflutter Last office visit:3/24 ZOX:WRUEA labs Age: 83 Weight:56.7  kg  Prescription refilled

## 2023-06-22 NOTE — Progress Notes (Unsigned)
 Cardiology Office Note:    Date:  06/23/2023   ID:  KENYANNA GRZESIAK, DOB 04/17/1940, MRN [id number]  PCP:  [name], MD  Cardiologist:    Micai Apolinar   Electrophysiologist:  None   Referring MD: [name], MD   No chief complaint on file.   Oct. 12, 2020    Dana Hall is a 83 y.o. female with a hx of HTN.  She has had some dizziness I saw her via telemedicine visit in July, 2020 and thought she had orthostatic hypotension. Echo showed normal LV systolic function and grade 2 diastolic dysfunction She has mild Aortic insufficiency  Overall is feeling well BP is a bit high today .   Has been rushing around this am.  Takes her BP at home and typically gets 120's   Remains active .    July 30, 2020 Dana Hall is seen for follow up of her HTN  No cardiac complaints  Works on the treadmill at [employer]   No CP or dyspnea   Nov. 11, 2022 Dana Hall is seen today for follow up of a recent hospitalization. She was hospitalized with a small bowell obstruction  Her hospitalization was complicated by pulmonary embolus and atrial fibrillation .   She was started on Eliquis  She had bilateral large pleural effusions. Had ridght thoracentesis    Last ECG was Oct. 30 Shows atrial flutter   Is still very short of breath ,  difficult to sleep lying down   Dec. 5, 2022: Seen with [name] ( daughter) , and [name] is seen today for follow up of her atrial fib, pulmonary embolus. She has been on Eliquis. Has had bilateral plueral effusions  Has regained most of her strength   She has new TWI in the anterior leads.  LV function could not be assessed in the hospital  We started Losartan and plan to repeat echo .    She has been recording her blood pressure.  Most of her blood pressure readings are in the normal range and some are slightly high.  It looks like we do have room to increase the losartan if needed.  June 12, 2021: At the seen today for follow-up of her atrial  fibrillation, pulmonary embolus and recent acute systolic congestive heart failure. Echocardiogram in December 28 revealed mildly depressed left ventricular systolic function with anterior wall hypokinesis.  Ejection fraction 45 to 50%.  Myoview study revealed an anterior septal defect which could have been due to the left bundle branch block or perhaps a previous myocardial infarction.  She did not want to schedule any further testing or heart catheterization.  She wanted to follow-up today and see how she was doing.  She is feeling much better . Finally getting over her SBO and surgery  BP at home is good  Is back doing her normal activities,    Spet. 6, 2023 Dana Hall is seen today for follow up of her chronic combined CHF ., atrial fib, pulmonary embolus , LBBB   BP is a bit elevated today .  Is feeling much better since last year .   No CP or dyspnea    June 25, 2022 Dana Hall is seen for follow up for her chronic combined CHF, atrial fib, pulmonary embolus, LBBB   Echocardiogram from December, 2022 reveals LVEF of 45 to 50%. Myoview study revealed some anterior septal perfusion defect thought to be possibly due to her left bundle branch block.  She did not want  to have a heart cath.   June 23, 2023 Dana Hall is seen for follow up of her CHF, LBBB , atrial fib , pulmonary embolus,  Had a bloody nose recently  Her BP was elevated  Losartan was increased to 50 mg BID  BP is occasionally borderline elevated at home but also she has normal BP readings   *    Past Medical History:  Diagnosis Date   Actinic keratosis    Dr.Gould   Anemia    Asthma    as a child   BPPV (benign paroxysmal positional vertigo), unspecified laterality    DDD (degenerative disc disease), lumbar    L5 facet arthritis and foraminal stenosis   Degenerative cervical disc    spondylosis   GERD (gastroesophageal reflux disease)    Hypercholesterolemia    Hypertension 11/21/2011   SBO (small bowel obstruction)  (HCC) 11/29/2011   Sensorineural hearing loss (SNHL) of both ears    Strangulation obstruction of intestine (HCC) 01/26/2012   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  EXPLORATORY LAPAROTOMY;  Surgeon: Ernestene Mention, MD;  Location: [facility];  Service: General;  Laterality: N/A;  exploratory laparotomy for bowel obstruction   LAPAROTOMY N/A 02/04/2021   Procedure: EXPLORATORY LAPAROTOMY;  Surgeon: Berna Bue, MD;  Location: [facility] OR;  Service: General;  Laterality: N/A;   LYSIS OF ADHESION N/A 02/04/2021   Procedure: LYSIS OF ADHESION WITH ADHESION BARRIER PLACED;  Surgeon: Berna Bue, MD;  Location: [facility] OR;  Service: General;  Laterality: N/A;   MULTIPLE TOOTH EXTRACTIONS     age 20    Current Medications: Current Meds  Medication Sig   acetaminophen (TYLENOL) 500 MG tablet Take 500 mg by mouth every 6 (six) hours as needed for mild pain.   apixaban (ELIQUIS) 2.5 MG TABS tablet Take 1 tablet (2.5 mg total) by mouth 2 (two) times daily. Needs Labs at next Cardiology visit for Eliquis Refills.  Thank you   losartan (COZAAR) 50 MG tablet Take 1 tablet (50 mg total) by mouth 2 (two) times daily.   Multiple Vitamins-Minerals (WOMENS 50+ MULTI VITAMIN) TABS as directed Orally     Allergies:   Patient has no known allergies.   Social History   Socioeconomic History   Marital status: Married    Spouse name: Not on file   Number of children: 3   Years of education: Not on file   Highest education level: Not on file  Occupational History   Occupation: retired special ed Runner, broadcasting/film/video  Tobacco Use   Smoking status: Never   Smokeless tobacco: Never  Vaping Use   Vaping status: Never Used  Substance and Sexual Activity   Alcohol use: Yes    Comment: rare   Drug use: No   Sexual activity: Not Currently  Other Topics Concern   Not on  file  Social History Narrative   Not on file   Social Drivers of Health   Financial Resource Strain: Not on file  Food Insecurity: Low Risk  (01/01/2023)   Received from [facility]   Hunger Vital Sign    Worried About Running Out of Food in the Last Year: Never true    Ran Out of Food in the Last Year: Never true  Transportation Needs: No Transportation Needs (01/01/2023)   Received from [facility]   Transportation    In the past 12 months, has lack of reliable transportation kept you from medical appointments, meetings, work or from getting things needed for daily living? : No  Physical Activity: Not on file  Stress: Not on file  Social Connections: Not on file     Family History: The patient's family history includes Heart disease in her mother. There is no history of Breast cancer.  ROS:   Please see the history of present illness.     All other systems reviewed and are negative.  EKGs/Labs/Other Studies Reviewed:    The following studies were reviewed today:   Recent Labs: No results found for requested labs within last 365 days.  Recent Lipid Panel    Component Value Date/Time   TRIG 124 02/11/2021 0850    Physical Exam:     Physical Exam: Blood pressure (!) 142/60, pulse 74, height 5' 6.5" (1.689 m), weight 129 lb (58.5 kg), SpO2 95%.  HYPERTENSION CONTROL Vitals:   06/23/23 1418 06/23/23 1452  BP: (!) 146/66 (!) 142/60    The patient's blood pressure is elevated above target today.  In order to address the patient's elevated BP: Blood pressure will be monitored at home to determine if medication changes need to be made.       GEN:  Well nourished, well developed in no acute distress HEENT: Normal NECK: No JVD; No carotid bruits LYMPHATICS: No lymphadenopathy CARDIAC: RRR , no murmurs, rubs, gallops RESPIRATORY:  Clear to auscultation without rales, wheezing or rhonchi  ABDOMEN: Soft, non-tender, non-distended MUSCULOSKELETAL:  No edema; No  deformity  SKIN: Warm and dry NEUROLOGIC:  Alert and oriented x 3      EKG:    EKG Interpretation Date/Time:  Tuesday June 23 2023 14:18:46 EDT Ventricular Rate:  70 PR Interval:  178 QRS Duration:  124 QT Interval:  430 QTC Calculation: 464 R Axis:   -70  Text Interpretation: Normal sinus rhythm Possible Left atrial enlargement Left axis deviation Left bundle branch block When compared with ECG of 10-Feb-2021 10:33, Sinus rhythm has replaced Atrial flutter QRS duration has increased T wave inversion less evident in Lateral leads Confirmed by Kristeen Miss ([id number]) on 06/23/2023 2:26:09 PM      ASSESSMENT:    1. Primary hypertension   2. LBBB (left bundle branch block)       PLAN:       LBBB :  EF is 45-50 %     stable    2.  Atrial fib:  Cont eliquis   3.  Pulmonary embolus :   continue eliquis     4.  Acute systolic CHF: Her heart function is much better.  She is healed up nicely following her acute hospitalization for small bowel obstruction.  Myoview showed a small ant. Septal fixed defect.    Possibly due to her LBBB .   We discussed cardiac cath but she did not want to have a cath /.  She is not having any symptoms   5.  HTN :   BP at low / normal at times and is then mildly elevated at times.   We will not be able to reduce the high readings without reducing the already normal readings to a low BP .   I think she is safer just continuing the current meds. Follow up in 1 year           Medication Adjustments/Labs and Tests Ordered: Current medicines are reviewed  at length with the patient today.  Concerns regarding medicines are outlined above.  Orders Placed This Encounter  Procedures   EKG 12-Lead     No orders of the defined types were placed in this encounter.      Patient Instructions  Follow-Up: At [facility], you and your health needs are our priority.  As part of our continuing mission to provide you with exceptional  heart care, we have created designated Provider Care Teams.  These Care Teams include your primary Cardiologist (physician) and Advanced Practice Providers (APPs -  Physician Assistants and Nurse Practitioners) who all work together to provide you with the care you need, when you need it.  Your next appointment:   1 year(s)  Provider:   Kristeen Miss, MD      1st Floor: - Lobby - Registration  - Pharmacy  - Lab - Cafe  2nd Floor: - PV Lab - Diagnostic Testing (echo, CT, nuclear med)  3rd Floor: - Vacant  4th Floor: - TCTS (cardiothoracic surgery) - AFib Clinic - Structural Heart Clinic - Vascular Surgery  - Vascular Ultrasound  5th Floor: - HeartCare Cardiology (general and EP) - Clinical Pharmacy for coumadin, hypertension, lipid, weight-loss medications, and med management appointments    Valet parking services will be available as well.     Signed, Kristeen Miss, MD  06/23/2023 2:52 PM    [facility]

## 2023-06-23 ENCOUNTER — Encounter: Payer: Self-pay | Admitting: Cardiovascular Disease

## 2023-06-23 ENCOUNTER — Ambulatory Visit: Payer: Medicare PPO | Attending: Cardiovascular Disease | Admitting: Cardiovascular Disease

## 2023-06-23 VITALS — BP 142/60 | HR 74 | Ht 66.5 in | Wt 129.0 lb

## 2023-06-23 DIAGNOSIS — I447 Left bundle-branch block, unspecified: Secondary | ICD-10-CM | POA: Diagnosis not present

## 2023-06-23 DIAGNOSIS — I10 Essential (primary) hypertension: Secondary | ICD-10-CM

## 2023-06-23 NOTE — Patient Instructions (Signed)

## 2023-06-29 ENCOUNTER — Ambulatory Visit
Admission: RE | Admit: 2023-06-29 | Discharge: 2023-06-29 | Disposition: A | Discharge status: Home / Self Care | Payer: Medicare PPO | Source: Ambulatory Visit | Attending: Family Medicine | Admitting: Family Medicine

## 2023-06-29 DIAGNOSIS — Z1231 Encounter for screening mammogram for malignant neoplasm of breast: Secondary | ICD-10-CM

## 2023-06-30 ENCOUNTER — Telehealth (INDEPENDENT_AMBULATORY_CARE_PROVIDER_SITE_OTHER): Payer: Self-pay | Admitting: Otolaryngology

## 2023-06-30 ENCOUNTER — Encounter (INDEPENDENT_AMBULATORY_CARE_PROVIDER_SITE_OTHER): Payer: Self-pay

## 2023-06-30 NOTE — Telephone Encounter (Signed)
 Reminder Call:  Date: 07/01/2023 Status: Sch  Time: 1:00 PM Sent MyChart Message w/location-[address] [place], [place] [address]

## 2023-07-01 ENCOUNTER — Ambulatory Visit (INDEPENDENT_AMBULATORY_CARE_PROVIDER_SITE_OTHER): Payer: Medicare PPO

## 2023-07-01 ENCOUNTER — Encounter (INDEPENDENT_AMBULATORY_CARE_PROVIDER_SITE_OTHER): Payer: Self-pay

## 2023-07-01 VITALS — BP 155/79 | HR 67 | Ht 65.0 in | Wt 127.0 lb

## 2023-07-01 DIAGNOSIS — H6123 Impacted cerumen, bilateral: Secondary | ICD-10-CM | POA: Diagnosis not present

## 2023-07-01 DIAGNOSIS — H903 Sensorineural hearing loss, bilateral: Secondary | ICD-10-CM

## 2023-07-02 ENCOUNTER — Other Ambulatory Visit: Payer: Self-pay | Admitting: Cardiovascular Disease

## 2023-07-02 DIAGNOSIS — H6123 Impacted cerumen, bilateral: Secondary | ICD-10-CM | POA: Insufficient documentation

## 2023-07-02 DIAGNOSIS — H903 Sensorineural hearing loss, bilateral: Secondary | ICD-10-CM | POA: Insufficient documentation

## 2023-07-02 NOTE — Progress Notes (Signed)
 Patient ID: Dana Hall, female   DOB: 1941-04-04, 83 y.o.   MRN: [id number]  CC: Hearing loss, recurrent cerumen impaction  HPI:  Dana Hall is a 83 y.o. female who presents today for evaluation of her hearing loss and recurrent cerumen impaction.  According to the patient, she has a history of bilateral sensorineural hearing loss.  She was previously treated by Dr. Ermalinda Barrios.  She was fitted with bilateral hearing aids.  In addition, she also has a history of recurrent cerumen impaction, requiring periodic disimpaction.  Currently she denies any otalgia, otorrhea, or vertigo.  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  EXPLORATORY LAPAROTOMY;  Surgeon: Ernestene Mention, MD;  Location: [facility];  Service: General;  Laterality: N/A;  exploratory laparotomy for bowel obstruction   LAPAROTOMY N/A 02/04/2021   Procedure: EXPLORATORY LAPAROTOMY;  Surgeon: Berna Bue, MD;  Location: [facility] OR;  Service: General;  Laterality: N/A;   LYSIS OF ADHESION N/A 02/04/2021   Procedure: LYSIS OF ADHESION WITH ADHESION BARRIER PLACED;  Surgeon: Berna Bue, MD;  Location: [facility] OR;  Service: General;  Laterality: N/A;   MULTIPLE TOOTH EXTRACTIONS     age 37    Family History  Problem  Relation Age of Onset   Heart disease Mother    Breast cancer Neg Hx     Social History:  reports that she has never smoked. She has never used smokeless tobacco. She reports current alcohol use. She reports that she does not use drugs.  Allergies: No Known Allergies  Prior to Admission medications   Medication Sig Start Date End Date Taking? Authorizing Provider  acetaminophen (TYLENOL) 500 MG tablet Take 500 mg by mouth every 6 (six) hours as needed for mild pain.   Yes [provider]  apixaban (ELIQUIS) 2.5 MG TABS tablet Take 1 tablet (2.5 mg total) by mouth 2 (two) times daily. Needs Labs at next Cardiology visit for Eliquis Refills.  Thank you 06/08/23  Yes Nahser, Deloris Ping, MD  losartan (COZAAR) 50 MG tablet Take 1 tablet (50 mg total) by mouth 2 (two) times daily. 06/05/23  Yes Arletha Pili, DO  Multiple Vitamins-Minerals (WOMENS 50+ MULTI VITAMIN) TABS as directed Orally   Yes [provider]    Blood pressure (!) 155/79, pulse 67, height 5\' 5"  (1.651 m), weight 127 lb (57.6 kg), SpO2 94%. Exam: General: Communicates without difficulty, well nourished, no acute distress. Head: Normocephalic, no evidence injury, no tenderness, facial buttresses intact without stepoff. Face/sinus: No tenderness to palpation and percussion. Facial movement is normal and symmetric. Eyes: PERRL, EOMI. No scleral icterus, conjunctivae clear. Neuro: CN II exam reveals vision grossly intact.  No nystagmus at any point of  gaze. Ears: Auricles well formed without lesions.  Bilateral cerumen impaction.  Nose: External evaluation reveals normal support and skin without lesions.  Dorsum is intact.  Anterior rhinoscopy reveals normal mucosa over anterior aspect of inferior turbinates and intact septum.  No purulence noted. Oral:  Oral cavity and oropharynx are intact, symmetric, without erythema or edema.  Mucosa is moist without lesions. Neck: Full range of motion without pain.  There is no  significant lymphadenopathy.  No masses palpable.  Thyroid bed within normal limits to palpation.  Parotid glands and submandibular glands equal bilaterally without mass.  Trachea is midline. Neuro:  CN 2-12 grossly intact.   Procedure: Bilateral cerumen disimpaction Anesthesia: None Description: Under the operating microscope, the cerumen is carefully removed with a combination of cerumen currette, alligator forceps, and suction catheters.  After the cerumen is removed, the TMs are noted to be normal.  No mass, erythema, or lesions. The patient tolerated the procedure well.    Assessment: 1.  Bilateral sensorineural hearing loss.  Her ear canals, tympanic membranes, and middle ear spaces are all normal. 2.  Bilateral cerumen impaction.  Plan: 1.  Otomicroscopy with bilateral cerumen disimpaction. 2.  The physical exam findings are reviewed with the patient. 3.  Continue the use of her hearing aids. 4.  The patient will return for reevaluation in 6 months.  Anistyn Graddy W Lakendra Helling 07/02/2023, 9:17 AM

## 2023-07-07 ENCOUNTER — Telehealth: Payer: Self-pay | Admitting: Cardiovascular Disease

## 2023-07-07 NOTE — Telephone Encounter (Signed)
 Pt c/o BP issue: STAT if pt c/o blurred vision, one-sided weakness or slurred speech.  STAT if BP is GREATER than 180/120 TODAY.  STAT if BP is LESS than 90/60 and SYMPTOMATIC TODAY  1. What is your BP concern? Pt called in stating he BP has still been high, please advise   2. Have you taken any BP medication today? Yes   3. What are your last 5 BP readings? 3/20: 140/74           159/83  3/21: 148/73  3/22: 148/76  3/23: 157/86  3/24: 147/83  3/25: 184/94 - this morning  3/25: 160/86 - over the phone   4. Are you having any other symptoms (ex. Dizziness, headache, blurred vision, passed out)? No

## 2023-07-07 NOTE — Telephone Encounter (Signed)
 Pt seen in office 06/23/23 by Nahser with in-office BP of 142/60. MD noted:  5.  HTN :   BP at low / normal at times and is then mildly elevated at times.   We will not be able to reduce the high readings without reducing the already normal readings to a low BP .   I think she is safer just continuing the current meds. Follow up in 1 year   Patient currently taking Losartan 50mg  twice daily. Will route to MD for advisement.

## 2023-07-09 DIAGNOSIS — M542 Cervicalgia: Secondary | ICD-10-CM | POA: Diagnosis not present

## 2023-07-09 DIAGNOSIS — M47812 Spondylosis without myelopathy or radiculopathy, cervical region: Secondary | ICD-10-CM | POA: Diagnosis not present

## 2023-07-09 DIAGNOSIS — M533 Sacrococcygeal disorders, not elsewhere classified: Secondary | ICD-10-CM | POA: Diagnosis not present

## 2023-07-27 DIAGNOSIS — M542 Cervicalgia: Secondary | ICD-10-CM | POA: Diagnosis not present

## 2023-08-01 ENCOUNTER — Other Ambulatory Visit: Payer: Self-pay | Admitting: Cardiovascular Disease

## 2023-08-01 DIAGNOSIS — I484 Atypical atrial flutter: Secondary | ICD-10-CM

## 2023-08-01 DIAGNOSIS — I2609 Other pulmonary embolism with acute cor pulmonale: Secondary | ICD-10-CM

## 2023-08-03 DIAGNOSIS — R1030 Lower abdominal pain, unspecified: Secondary | ICD-10-CM | POA: Diagnosis not present

## 2023-08-03 DIAGNOSIS — I10 Essential (primary) hypertension: Secondary | ICD-10-CM | POA: Diagnosis not present

## 2023-08-03 DIAGNOSIS — R42 Dizziness and giddiness: Secondary | ICD-10-CM | POA: Diagnosis not present

## 2023-08-03 NOTE — Telephone Encounter (Signed)
 Prescription refill request for Eliquis  received. Indication:aflutter Last office visit:3/25 Scr:0.62  1/25 Age: 83 Weight:57.6  kg  Prescription refilled

## 2023-08-05 DIAGNOSIS — M542 Cervicalgia: Secondary | ICD-10-CM | POA: Diagnosis not present

## 2023-08-07 DIAGNOSIS — M542 Cervicalgia: Secondary | ICD-10-CM | POA: Diagnosis not present

## 2023-08-10 DIAGNOSIS — M542 Cervicalgia: Secondary | ICD-10-CM | POA: Diagnosis not present

## 2023-08-11 ENCOUNTER — Other Ambulatory Visit: Payer: Self-pay | Admitting: Family Medicine

## 2023-08-11 DIAGNOSIS — R1030 Lower abdominal pain, unspecified: Secondary | ICD-10-CM

## 2023-08-12 DIAGNOSIS — M542 Cervicalgia: Secondary | ICD-10-CM | POA: Diagnosis not present

## 2023-08-17 DIAGNOSIS — M542 Cervicalgia: Secondary | ICD-10-CM | POA: Diagnosis not present

## 2023-08-18 DIAGNOSIS — I10 Essential (primary) hypertension: Secondary | ICD-10-CM | POA: Diagnosis not present

## 2023-08-18 DIAGNOSIS — R059 Cough, unspecified: Secondary | ICD-10-CM | POA: Diagnosis not present

## 2023-08-18 DIAGNOSIS — I5032 Chronic diastolic (congestive) heart failure: Secondary | ICD-10-CM | POA: Diagnosis not present

## 2023-08-19 ENCOUNTER — Encounter: Payer: Self-pay | Admitting: Cardiovascular Disease

## 2023-08-19 DIAGNOSIS — M542 Cervicalgia: Secondary | ICD-10-CM | POA: Diagnosis not present

## 2023-08-20 ENCOUNTER — Ambulatory Visit
Admission: RE | Admit: 2023-08-20 | Discharge: 2023-08-20 | Disposition: A | Discharge status: Home / Self Care | Source: Ambulatory Visit | Attending: Family Medicine | Admitting: Family Medicine

## 2023-08-20 DIAGNOSIS — R1032 Left lower quadrant pain: Secondary | ICD-10-CM | POA: Diagnosis not present

## 2023-08-20 DIAGNOSIS — R1030 Lower abdominal pain, unspecified: Secondary | ICD-10-CM

## 2023-08-20 MED ORDER — IOPAMIDOL (ISOVUE-300) INJECTION 61%
100.0000 mL | Freq: Once | INTRAVENOUS | Status: AC | PRN
  Administered 2023-08-20: 100 mL via INTRAVENOUS

## 2023-09-02 ENCOUNTER — Telehealth (HOSPITAL_BASED_OUTPATIENT_CLINIC_OR_DEPARTMENT_OTHER): Payer: Self-pay | Admitting: *Deleted

## 2023-09-02 ENCOUNTER — Telehealth: Payer: Self-pay

## 2023-09-02 DIAGNOSIS — K219 Gastro-esophageal reflux disease without esophagitis: Secondary | ICD-10-CM | POA: Insufficient documentation

## 2023-09-02 DIAGNOSIS — E441 Mild protein-calorie malnutrition: Secondary | ICD-10-CM | POA: Insufficient documentation

## 2023-09-02 DIAGNOSIS — R1310 Dysphagia, unspecified: Secondary | ICD-10-CM | POA: Insufficient documentation

## 2023-09-02 DIAGNOSIS — I700 Atherosclerosis of aorta: Secondary | ICD-10-CM | POA: Insufficient documentation

## 2023-09-02 DIAGNOSIS — E2839 Other primary ovarian failure: Secondary | ICD-10-CM | POA: Insufficient documentation

## 2023-09-02 DIAGNOSIS — I4891 Unspecified atrial fibrillation: Secondary | ICD-10-CM | POA: Insufficient documentation

## 2023-09-02 DIAGNOSIS — I10 Essential (primary) hypertension: Secondary | ICD-10-CM | POA: Insufficient documentation

## 2023-09-02 DIAGNOSIS — M5481 Occipital neuralgia: Secondary | ICD-10-CM | POA: Diagnosis not present

## 2023-09-02 DIAGNOSIS — K5900 Constipation, unspecified: Secondary | ICD-10-CM | POA: Insufficient documentation

## 2023-09-02 DIAGNOSIS — M47812 Spondylosis without myelopathy or radiculopathy, cervical region: Secondary | ICD-10-CM | POA: Diagnosis not present

## 2023-09-02 DIAGNOSIS — M542 Cervicalgia: Secondary | ICD-10-CM | POA: Insufficient documentation

## 2023-09-02 DIAGNOSIS — M51369 Other intervertebral disc degeneration, lumbar region without mention of lumbar back pain or lower extremity pain: Secondary | ICD-10-CM | POA: Insufficient documentation

## 2023-09-02 DIAGNOSIS — D6869 Other thrombophilia: Secondary | ICD-10-CM | POA: Insufficient documentation

## 2023-09-02 NOTE — Telephone Encounter (Signed)
   Pre-operative Risk Assessment    Patient Name: Dana Hall  DOB: 1940/05/28 MRN: [id number]   Date of last office visit: 06/23/2023 Date of next office visit: None  Request for Surgical Clearance    Procedure:  Right Occipital Nerve Block(PROC)  Date of Surgery:  Clearance 09/09/23                                 Surgeon:  Dr. Dicie Foster Group or Practice Name:  [facility] Phone number:  [phone] Fax number:  [phone]   Type of Clearance Requested:   - Medical  - Pharmacy:  Hold Apixaban  (Eliquis ) 3 days prior.   Type of Anesthesia:  Not Indicated   Additional requests/questions:    Signed, Lauris Port   09/02/2023, 12:40 PM

## 2023-09-02 NOTE — Telephone Encounter (Signed)
 Patient with diagnosis of PE and A Fib on Eliquis  for anticoagulation.    Procedure: Right Occipital Nerve Block(PROC)  Date of procedure: 09/09/23   CHA2DS2-VASc Score = 6  This indicates a 9.7% annual risk of stroke. The patient's score is based upon: CHF History: 1 HTN History: 1 Diabetes History: 0 Stroke History: 0 Vascular Disease History: 1 Age Score: 2 Gender Score: 1    CrCl 60 ml/min Platelet count 231K   Per office protocol, patient can hold Eliquis  for 3 days prior to procedure.     **This guidance is not considered finalized until pre-operative APP has relayed final recommendations.**

## 2023-09-02 NOTE — Telephone Encounter (Signed)
  Patient Consent for Virtual Visit        Dana Hall has provided verbal consent on 09/02/2023 for a virtual visit (video or telephone).   CONSENT FOR VIRTUAL VISIT FOR:  Dana Hall  By participating in this virtual visit I agree to the following:  I hereby voluntarily request, consent and authorize [name] and its employed or contracted physicians, physician assistants, nurse practitioners or other licensed health care professionals (the Practitioner), to provide me with telemedicine health care services (the "Services") as deemed necessary by the treating Practitioner. I acknowledge and consent to receive the Services by the Practitioner via telemedicine. I understand that the telemedicine visit will involve communicating with the Practitioner through live audiovisual communication technology and the disclosure of certain medical information by electronic transmission. I acknowledge that I have been given the opportunity to request an in-person assessment or other available alternative prior to the telemedicine visit and am voluntarily participating in the telemedicine visit.  I understand that I have the right to withhold or withdraw my consent to the use of telemedicine in the course of my care at any time, without affecting my right to future care or treatment, and that the Practitioner or I may terminate the telemedicine visit at any time. I understand that I have the right to inspect all information obtained and/or recorded in the course of the telemedicine visit and may receive copies of available information for a reasonable fee.  I understand that some of the potential risks of receiving the Services via telemedicine include:  Delay or interruption in medical evaluation due to technological equipment failure or disruption; Information transmitted may not be sufficient (e.g. poor resolution of images) to allow for appropriate medical decision making by the  Practitioner; and/or  In rare instances, security protocols could fail, causing a breach of personal health information.  Furthermore, I acknowledge that it is my responsibility to provide information about my medical history, conditions and care that is complete and accurate to the best of my ability. I acknowledge that Practitioner's advice, recommendations, and/or decision may be based on factors not within their control, such as incomplete or inaccurate data provided by me or distortions of diagnostic images or specimens that may result from electronic transmissions. I understand that the practice of medicine is not an exact science and that Practitioner makes no warranties or guarantees regarding treatment outcomes. I acknowledge that a copy of this consent can be made available to me via my patient portal [facility] MyChart), or I can request a printed copy by calling the office of [facility].    I understand that my insurance will be billed for this visit.   I have read or had this consent read to me. I understand the contents of this consent, which adequately explains the benefits and risks of the Services being provided via telemedicine.  I have been provided ample opportunity to ask questions regarding this consent and the Services and have had my questions answered to my satisfaction. I give my informed consent for the services to be provided through the use of telemedicine in my medical care

## 2023-09-02 NOTE — Telephone Encounter (Signed)
 Pt schedule for VV on 09/03/23

## 2023-09-02 NOTE — Telephone Encounter (Signed)
   Name: Dana Hall  DOB: November 17, 1940  MRN: [id number]  Primary Cardiologist: Ahmad Alert, MD   Preoperative team, please contact this patient and set up a phone call appointment for further preoperative risk assessment. Please obtain consent and complete medication review. Thank you for your help.  I confirm that guidance regarding antiplatelet and oral anticoagulation therapy has been completed and, if necessary, noted below.  Patient with diagnosis of PE and A Fib on Eliquis  for anticoagulation.     Procedure: Right Occipital Nerve Block(PROC)  Date of procedure: 09/09/23     CHA2DS2-VASc Score = 6  This indicates a 9.7% annual risk of stroke. The patient's score is based upon: CHF History: 1 HTN History: 1 Diabetes History: 0 Stroke History: 0 Vascular Disease History: 1 Age Score: 2 Gender Score: 1     CrCl 60 ml/min Platelet count 231K     Per office protocol, patient can hold Eliquis  for 3 days prior to procedure.    I also confirmed the patient resides in the state of [place] . As per [facility] Medical Board telemedicine laws, the patient must reside in the state in which the provider is licensed.   Friddie Jetty, NP 09/02/2023, 3:26 PM [facility]

## 2023-09-03 ENCOUNTER — Telehealth: Payer: Self-pay | Admitting: Nurse Practitioner

## 2023-09-03 ENCOUNTER — Ambulatory Visit: Attending: Cardiology

## 2023-09-03 DIAGNOSIS — Z01810 Encounter for preprocedural cardiovascular examination: Secondary | ICD-10-CM | POA: Diagnosis not present

## 2023-09-03 NOTE — Telephone Encounter (Signed)
 Call placed at 9:40 to complete designated preoperative clearance visit.  Patient was not available and message left to return call at earliest convenience.

## 2023-09-03 NOTE — Progress Notes (Signed)
 Virtual Visit via Telephone Note   Because of MASHELLE BUSICK co-morbid illnesses, she is at least at moderate risk for complications without adequate follow up.  This format is felt to be most appropriate for this patient at this time.  Due to technical limitations with video connection (technology), today's appointment will be conducted as an audio only telehealth visit, and ISABELLAROSE KOPE verbally agreed to proceed in this manner.   All issues noted in this document were discussed and addressed.  No physical exam could be performed with this format.  Evaluation Performed:  Preoperative cardiovascular risk assessment _____________   Date:  09/03/2023   Patient ID:  AILIE GAGE, DOB 1940-06-14, MRN [id number] Patient Location:  Home Provider location:   Office  Primary Care Provider:  Sun, Vyvyan, MD Primary Cardiologist:  Ahmad Alert, MD  Chief Complaint / Patient Profile   83 y.o. y/o female with a h/o HTN, HLD, PAF, HFrEF, LBBB history of PE who is pending right occipital nerve block and presents today for telephonic preoperative cardiovascular risk assessment.  History of Present Illness    MAURIAH MCMILLEN is a 83 y.o. female who presents via audio/video conferencing for a telehealth visit today.  Pt was last seen in cardiology clinic on 06/23/2023 by Dr. Alroy Aspen.  At that time ASHLAY ALTIERI was doing well with BP elevated and losartan  increased to 50 mg twice daily.  She had no complaints of chest pain or shortness of breath.  The patient is now pending procedure as outlined above. Since her last visit, she has been doing well with no new cardiac complaints.  She is staying active and reports no chest pain or shortness of breath with walking or exertional activities.  She denies chest pain, shortness of breath, lower extremity edema, fatigue, palpitations, melena, hematuria, hemoptysis, diaphoresis, weakness, presyncope, syncope, orthopnea, and PND.    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  EXPLORATORY LAPAROTOMY;  Surgeon: Levert Ready, MD;  Location: [facility];  Service: General;  Laterality: N/A;  exploratory laparotomy for bowel obstruction   LAPAROTOMY N/A 02/04/2021   Procedure: EXPLORATORY LAPAROTOMY;  Surgeon: Adalberto Acton, MD;  Location: [facility] OR;  Service: General;  Laterality: N/A;   LYSIS OF ADHESION N/A 02/04/2021   Procedure: LYSIS OF ADHESION WITH ADHESION BARRIER PLACED;  Surgeon: Adalberto Acton, MD;  Location: [facility] OR;  Service: General;  Laterality: N/A;   MULTIPLE TOOTH EXTRACTIONS     age 56    Allergies  No Known Allergies  Home Medications    Prior to Admission medications   Medication Sig Start Date End Date Taking? Authorizing Provider  acetaminophen  (TYLENOL ) 500 MG  tablet Take 500 mg by mouth every 6 (six) hours as needed for mild pain.    [provider]  apixaban  (ELIQUIS ) 2.5 MG TABS tablet Take 1 tablet (2.5 mg total) by mouth 2 (two) times daily. 08/03/23   Nahser, Lela Purple, MD  losartan  (COZAAR ) 50 MG tablet Take 1 tablet (50 mg total) by mouth 2 (two) times daily. 07/03/23   Nahser, Lela Purple, MD  Multiple Vitamins-Minerals (WOMENS 50+ MULTI VITAMIN) TABS as directed  Orally    [provider]    Physical Exam    Vital Signs:  FREDONIA CASALINO does not have vital signs available for review today.128/66-72  Given telephonic nature of communication, physical exam is limited. AAOx3. NAD. Normal affect.  Speech and respirations are unlabored.  Accessory Clinical Findings    None  Assessment & Plan    1.  Preoperative Cardiovascular Risk Assessment: - Patient's RCRI score is 0.9%  The patient affirms she has been doing well without any new cardiac symptoms. They are able to achieve 6 METS without cardiac limitations. Therefore, based on ACC/AHA guidelines, the patient would be at acceptable risk for the planned procedure without further cardiovascular testing. The patient was advised that if she develops new symptoms prior to surgery to contact our office to arrange for a follow-up visit, and she verbalized understanding.   The patient was advised that if she develops new symptoms prior to surgery to contact our office to arrange for a follow-up visit, and she verbalized understanding.  Per office protocol, patient can hold Eliquis  for 3 days prior to procedure.    A copy of this note will be routed to requesting surgeon.  Time:   Today, I have spent 8 minutes with the patient with telehealth technology discussing medical history, symptoms, and management plan.     Francene Ing, Retha Cast, NP  09/03/2023, 7:17 AM

## 2023-09-09 DIAGNOSIS — M5481 Occipital neuralgia: Secondary | ICD-10-CM | POA: Diagnosis not present

## 2023-10-03 IMAGING — MG MM DIGITAL SCREENING BILAT W/ TOMO AND CAD
8 series · 9 of 24 positions shown · non-contrast
Comparison: Previous exam(s).

CLINICAL DATA: Screening.

EXAM:
DIGITAL SCREENING BILATERAL MAMMOGRAM WITH TOMOSYNTHESIS AND CAD
TECHNIQUE: Bilateral screening digital craniocaudal and mediolateral oblique
mammograms were obtained. Bilateral screening digital breast
tomosynthesis was performed. The images were evaluated with
computer-aided detection.

[R CC synth-2D]
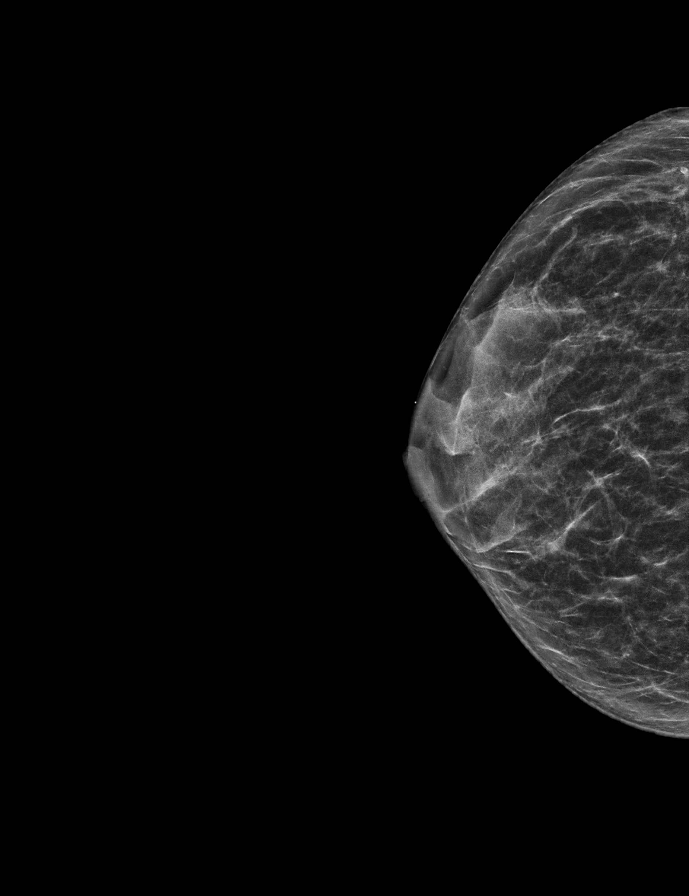

[R MLO synth-2D]
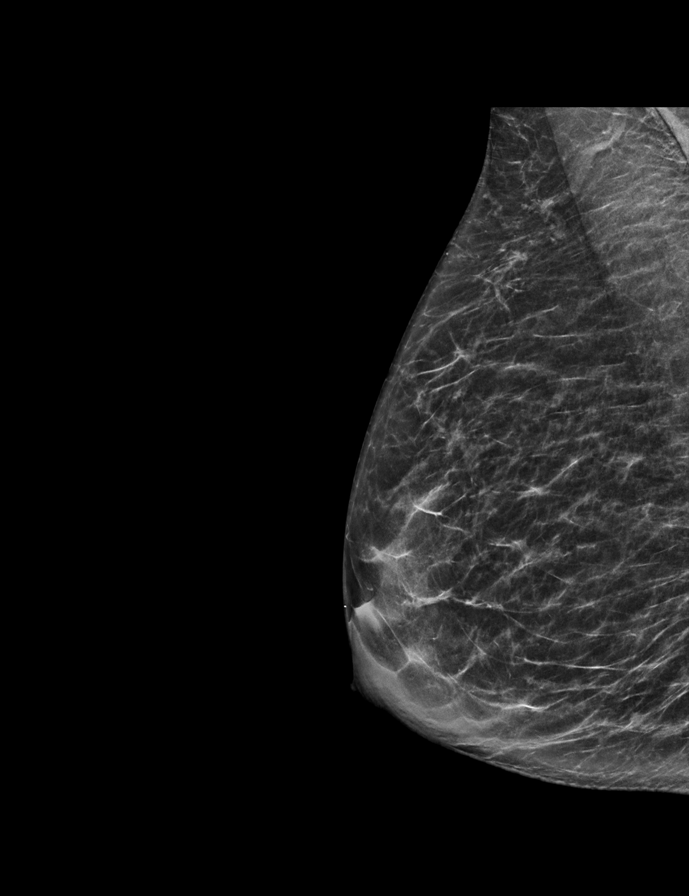

[L MLO synth-2D]
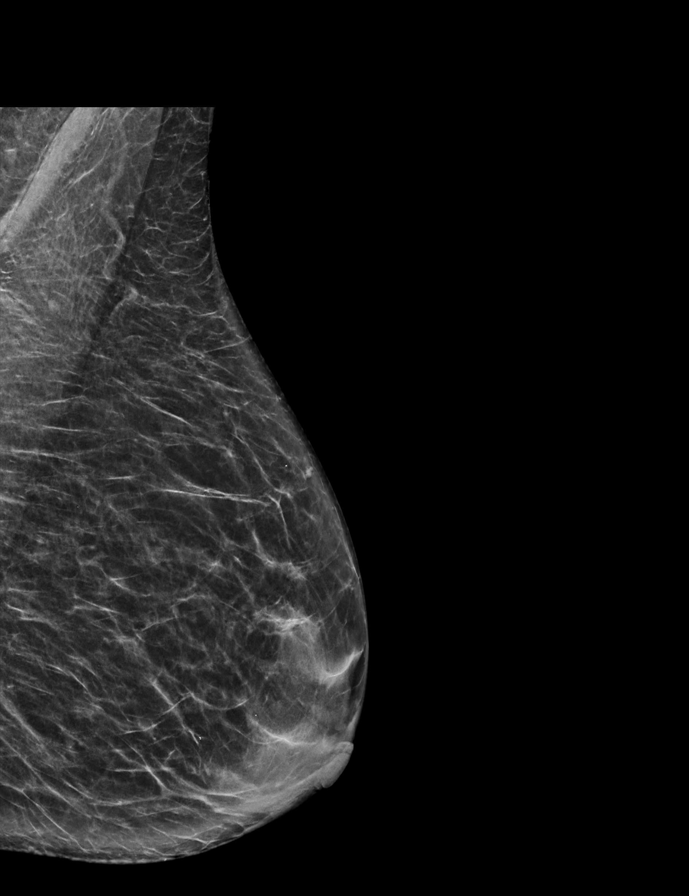

[L CC synth-2D]
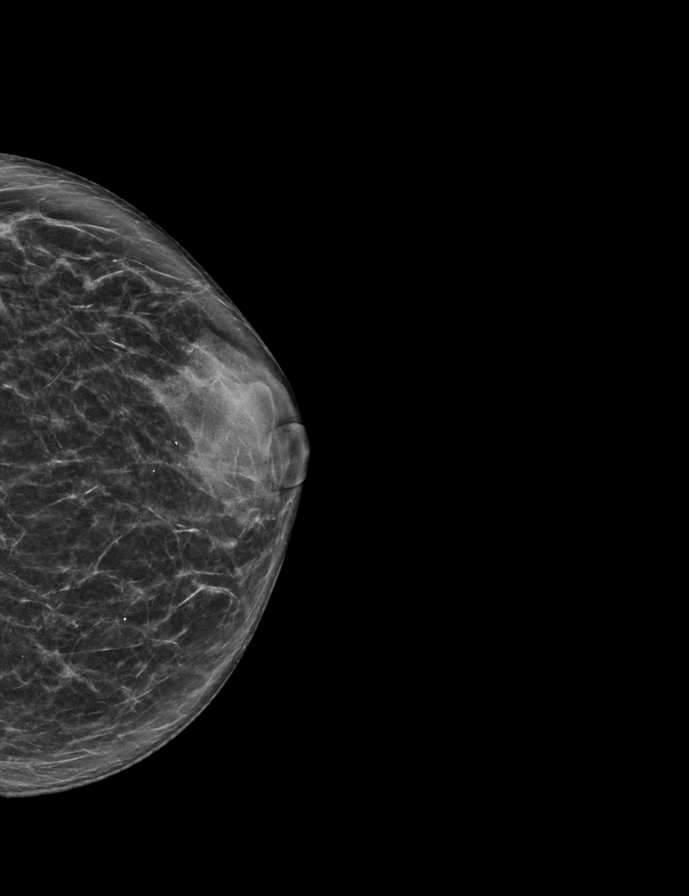

[L CC tomo · 2 of 56 frames shown]
[frame 19/56]
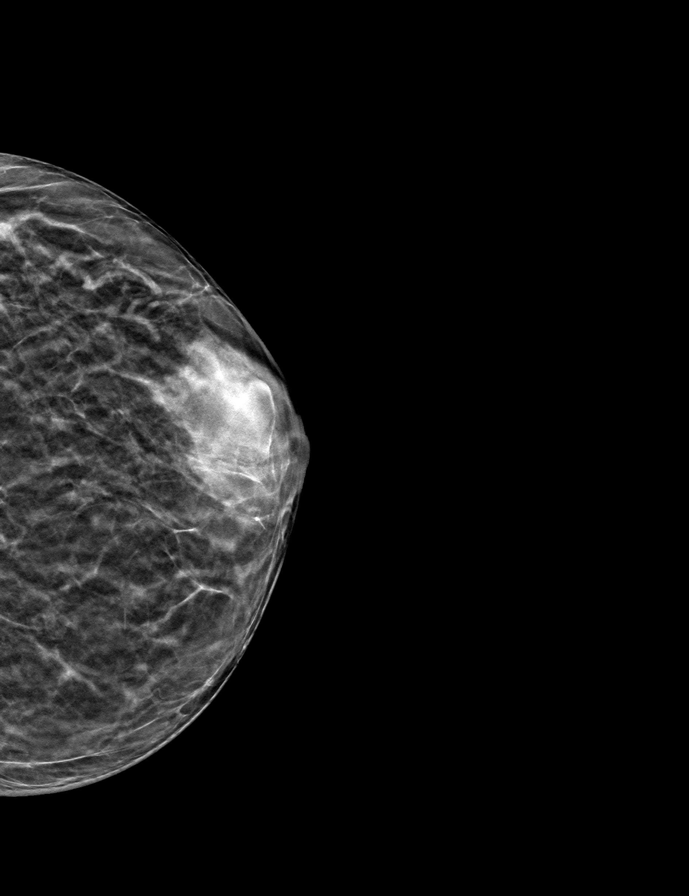
[frame 29/56]
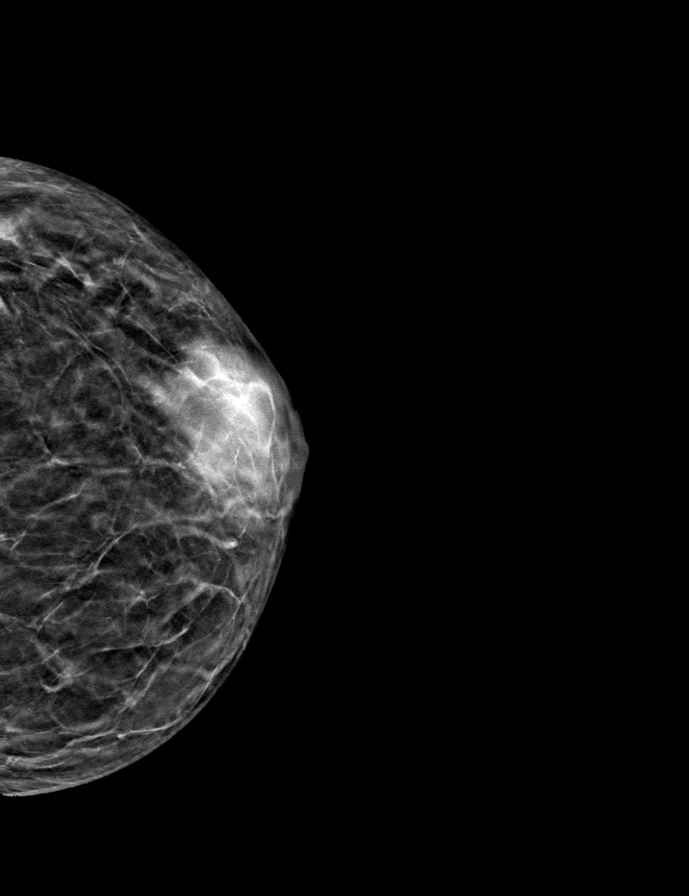

[L MLO tomo · tomo slice 31/62.0]
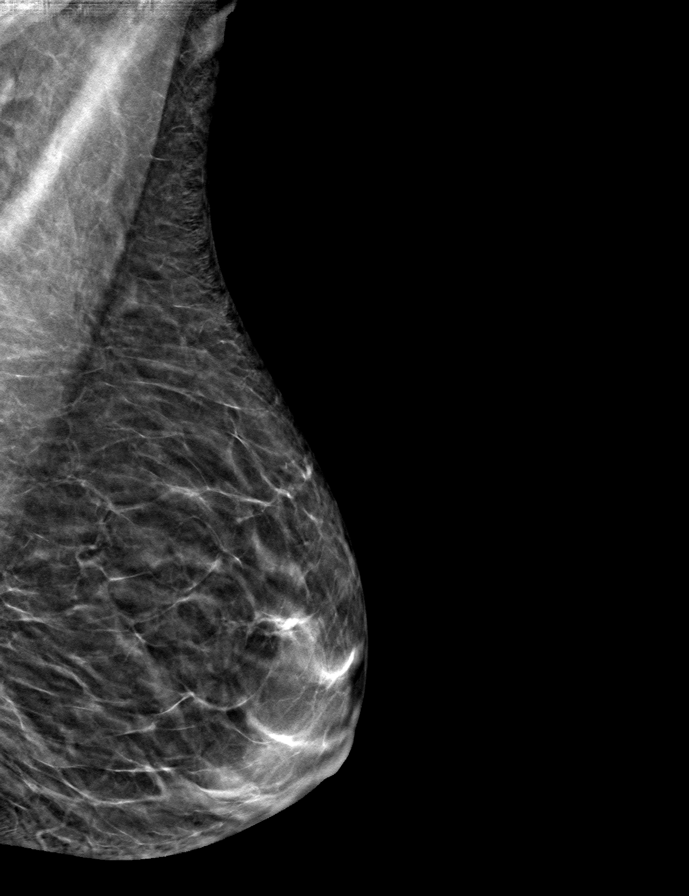

[R CC tomo · tomo slice 27/53.0]
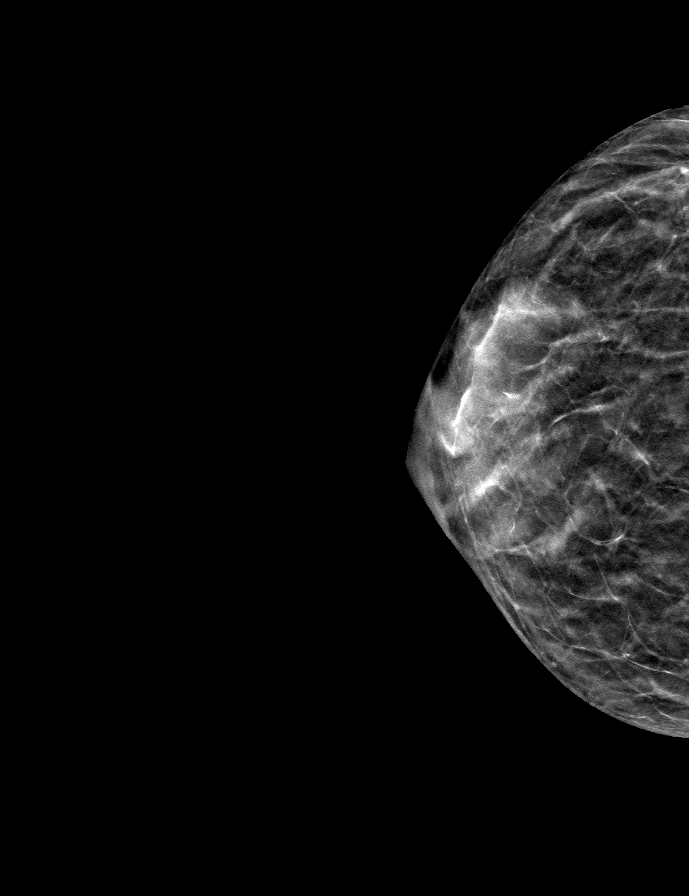

[R MLO tomo · tomo slice 29/57.0]
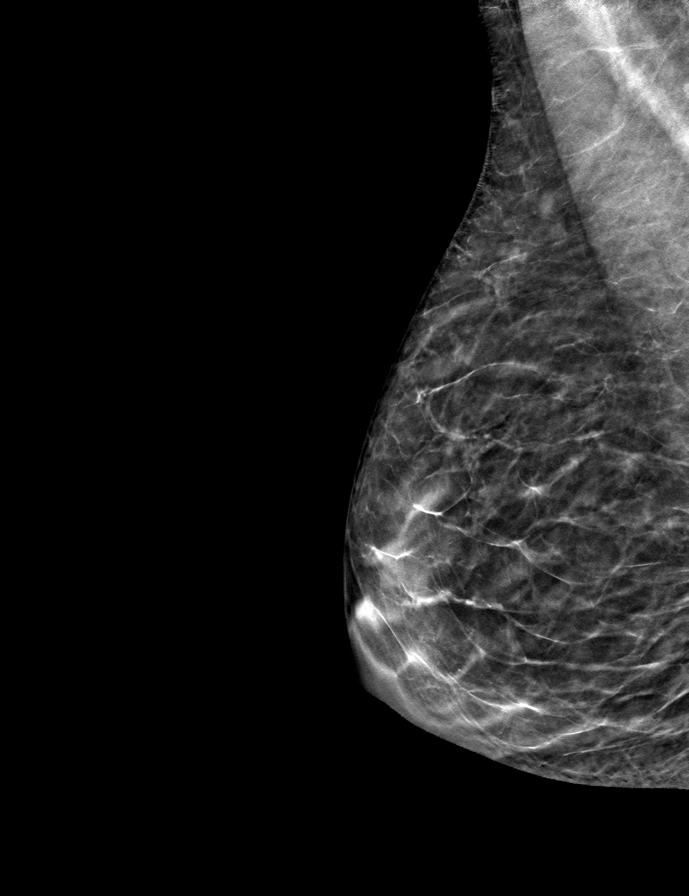

[9 of 24 positions shown; findings below may reference images not displayed]

ACR Breast Density Category b: There are scattered areas of
fibroglandular density.
FINDINGS: There are no findings suspicious for malignancy.
IMPRESSION: No mammographic evidence of malignancy. A result letter of this
screening mammogram will be mailed directly to the patient.

RECOMMENDATION:
Screening mammogram in one year. (Code:51-O-LD2)

BI-RADS CATEGORY  1: Negative.

## 2024-01-01 DIAGNOSIS — M79672 Pain in left foot: Secondary | ICD-10-CM | POA: Diagnosis not present

## 2024-01-05 ENCOUNTER — Ambulatory Visit (INDEPENDENT_AMBULATORY_CARE_PROVIDER_SITE_OTHER): Admitting: Otolaryngology

## 2024-01-05 ENCOUNTER — Encounter (INDEPENDENT_AMBULATORY_CARE_PROVIDER_SITE_OTHER): Payer: Self-pay | Admitting: Otolaryngology

## 2024-01-05 VITALS — BP 128/72 | HR 72 | Temp 97.5°F

## 2024-01-05 DIAGNOSIS — H6123 Impacted cerumen, bilateral: Secondary | ICD-10-CM

## 2024-01-05 NOTE — Progress Notes (Signed)
 Patient ID: Dana Hall, female   DOB: [birth year], 83 y.o.   MRN: [id number]  Procedure: Bilateral cerumen disimpaction.   Indication: Cerumen impaction, resulting in ear discomfort and conductive hearing loss.   Description: The patient is placed supine on the operating table. Under the operating microscope, the right ear canal is examined and is noted to be impacted with cerumen. The cerumen is carefully removed with a combination of suction catheters, cerumen curette, and alligator forceps. After the cerumen removal, the ear canal and tympanic membrane are noted to be normal. No middle ear effusion is noted. The same procedure is then repeated on the left side without exception. The patient tolerated the procedure well.  Follow-up care:  The patient is instructed not to use Q-tips to clean the ear canals. The patient will follow up in 6 months.

## 2024-01-28 ENCOUNTER — Telehealth: Payer: Self-pay | Admitting: Student in an Organized Health Care Education/Training Program

## 2024-01-28 DIAGNOSIS — I484 Atypical atrial flutter: Secondary | ICD-10-CM

## 2024-01-28 DIAGNOSIS — I2609 Other pulmonary embolism with acute cor pulmonale: Secondary | ICD-10-CM

## 2024-01-28 MED ORDER — APIXABAN 2.5 MG PO TABS: 2.5000 mg | ORAL_TABLET | Freq: Two times a day (BID) | ORAL | 1 refills | Status: AC

## 2024-01-28 NOTE — Telephone Encounter (Signed)
 Prescription refill request for Eliquis  received. Indication: Afib Last office visit: 06/23/23 Scr: 0.62 05/06/23 Age: 83 Weight: 57.6kg  Pt meets protocol, sending 72mo rf to pharmacy

## 2024-01-28 NOTE — Telephone Encounter (Signed)
*  STAT* If patient is at the pharmacy, call can be transferred to refill team.   1. Which medications need to be refilled? (please list name of each medication and dose if known)  apixaban  (ELIQUIS ) 2.5 MG TABS tablet  2. Which pharmacy/location (including street and city if local pharmacy) is medication to be sent to? [id number] - [place], [place] - [address]    3. Do they need a 30 day or 90 day supply?  90 day supply

## 2024-03-02 ENCOUNTER — Telehealth: Payer: Self-pay | Admitting: Cardiology

## 2024-03-02 DIAGNOSIS — H52203 Unspecified astigmatism, bilateral: Secondary | ICD-10-CM | POA: Diagnosis not present

## 2024-03-02 DIAGNOSIS — Z961 Presence of intraocular lens: Secondary | ICD-10-CM | POA: Diagnosis not present

## 2024-03-02 DIAGNOSIS — H353111 Nonexudative age-related macular degeneration, right eye, early dry stage: Secondary | ICD-10-CM | POA: Diagnosis not present

## 2024-03-02 NOTE — Telephone Encounter (Signed)
 Pt c/o BP issue: STAT if pt c/o blurred vision, one-sided weakness or slurred speech.  STAT if BP is GREATER than 180/120 TODAY.  STAT if BP is LESS than 90/60 and SYMPTOMATIC TODAY  1. What is your BP concern? See below  2. Have you taken any BP medication today?  Yes  3. What are your last 5 BP readings?  11/18 - 186/97  HR 64 11/19 - 176/88              172/88  4. Are you having any other symptoms (ex. Dizziness, headache, blurred vision, passed out)?  Headache  Patient is concerned her BP has been trending high.

## 2024-03-02 NOTE — Telephone Encounter (Signed)
 Dana Agent, MD to Me     03/02/24  1:18 PM She can keep blood pressure readings about 3 times a day for the next 10 days and send those to us .  I spoke with patient and gave her message from Dr Dana

## 2024-03-02 NOTE — Telephone Encounter (Signed)
 Per 3/11 office note  5.  HTN :   BP at low / normal at times and is then mildly elevated at times.   We will not be able to reduce the high readings without reducing the already normal readings to a low BP .   I think she is safer just continuing the current meds. Follow up in 1 year .    Patient is scheduled to see Dr Lavona in March.   I spoke with patient.  She reports prior to the last few days she last checked her BP on 9/23 and it was 118/73.  She decided recently to start checking her BP again.  Was not having any symptoms 11/16- 157/75 11/17- 145/71, 142/68 Other readings listed below.  Readings from today were taken prior to taking AM losartan  Patient watches salt intake.  Does not eat out often. Has a slight headache but no other symptoms. It has now been 3 hours since she took Losartan  and she rechecked while on phone with me and BP is 133/79  I advised patient to check BP daily about 2 hours after taking Losartan  and keep record of readings.  She will send these in or call to office.    I let patient know we would contact her if Dr Lavona recommended any changes at this time

## 2024-03-22 ENCOUNTER — Telehealth: Payer: Self-pay | Admitting: Cardiology

## 2024-03-22 NOTE — Telephone Encounter (Signed)
 Pt came in to drop off ppwk regarding blood pressure record. I left it in your mailbox. Thank you.

## 2024-03-24 NOTE — Telephone Encounter (Signed)
 Patient called to follow-up on next steps regarding blood pressure record.

## 2024-03-28 NOTE — Telephone Encounter (Signed)
 Spoke with patient and shared message from Dr. Lavona. No available appts sooner than March with Dr. Lavona. Referral to Pharm D for HTN Clinic ordered.  Patient reports recent BP readings: 153/84 158/87 150/78 162/80 150/80  She reports in mid-November she had a headache on the right side of her head for 2 weeks that eventually went away. No new complaints of headache. Denies blurred vision or chest pain.  Will forward to Pharm D to review and informed patient our office will call her to schedule appt with Pharm D in HTN clinic soon.  Confirmed patient is currently taking Losartan  50 mg BID.

## 2024-03-28 NOTE — Telephone Encounter (Signed)
 Patient calling to check on status of BP logs she dropped off last week. She said she was waiting on a callback from the nurse.

## 2024-03-29 ENCOUNTER — Ambulatory Visit: Admitting: Pharmacist

## 2024-03-29 VITALS — BP 138/72 | HR 69

## 2024-03-29 DIAGNOSIS — I10 Essential (primary) hypertension: Secondary | ICD-10-CM

## 2024-03-29 MED ORDER — AMLODIPINE BESYLATE 2.5 MG PO TABS: 2.5000 mg | ORAL_TABLET | Freq: Every day | ORAL | 3 refills | Status: AC

## 2024-03-29 NOTE — Progress Notes (Signed)
 Patient ID: Dana Hall                 DOB: [birth year]                      MRN: [id number]      HPI: Dana Hall is a 83 y.o. female referred by Dr. Lavona to HTN clinic. PMH is significant for HTN, GERD, HLD, anemia, LBBB, PE, HFmrEF.   Patient called grand mid November reporting elevated blood pressures.  She was asked to check her blood pressure for 2 weeks.  Gave these readings to the clinic.  Because patient was previously seen by Dr. Alveta and has not yet seen Dr. Lavona Dr. Lavona requested that patient have an appointment with him or Pharm.D. clinic.  Due to a few cancellations on my schedule I was able to reach out to patient and get her scheduled same day.  Patient brings in list of blood pressure readings which range mainly from 130s to 160s/70s to 90s.  She had 2 readings in the 120s systolic.  She reports not sleeping well.  Although she did sleep pretty good last night.  This is a chronic issue.  She reports increased stress with taking care of her husband with a lot of health issues.  When she for started having issues with her blood pressure she noticed a headache over right eye lasted for several days and then got better.  She reports feeling tired.  She brought in her home [employer] blood pressure cuff which was found to be accurate Tired  Home OMRON: 143/80 OMRON 135/78 Clinic: 138/72   Current HTN meds: losartan  50mg  twice a day Previously tried: possibly amlodipine , lisinopril  (changed to losartan ) BP goal: <130/80  Family History:  Family History  Problem Relation Age of Onset   Heart disease Mother    Breast cancer Neg Hx     Social History: No tobacco, rare alcohol  Diet: Not discussed in detail today  Exercise: walks 2 miles 2-3 times per week   Home BP readings:  range mainly from 130s to 160s/70s to 90s  Wt Readings from Last 3 Encounters:  07/01/23 127 lb (57.6 kg)  06/23/23 129 lb (58.5 kg)  06/05/23 125 lb (56.7 kg)   BP  Readings from Last 3 Encounters:  03/29/24 138/72  01/05/24 128/72  07/01/23 (!) 155/79   Pulse Readings from Last 3 Encounters:  03/29/24 69  01/05/24 72  07/01/23 67    Renal function: CrCl cannot be calculated (Patient's most recent lab result is older than the maximum 21 days allowed.).  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   Assessment/Plan:     1. Hypertension -  Hypertension Assessment: Blood pressure slightly elevated in clinic today She has had  a decent amount of home readings in the 150s and 160s which are still above goal even if we use a stringent goal of less than 140/80  Reports increased stress and poor sleep Exercises fairly frequently  Plan: Start amlodipine  2.5 mg daily Continue losartan  50 mg twice daily Follow-up in clinic in 6 weeks Consider doing [facility] online program of go to sleep now      Thank you  Sariya Trickey D Janney Priego, Pharm.Dana Hall, CPP [facility] [address] [place], [place] [address]  Phone: [phone]; Fax: [phone]            [1]  Current Outpatient Medications on File Prior to Visit  Medication Sig Dispense Refill   acetaminophen  (TYLENOL ) 500 MG tablet Take 500 mg by mouth every 6 (six)  hours as needed for mild pain.     apixaban  (ELIQUIS ) 2.5 MG TABS tablet Take 1 tablet (2.5 mg total) by mouth 2 (two) times daily. 180 tablet 1   losartan  (COZAAR ) 50 MG tablet Take 1 tablet (50 mg total) by mouth 2 (two) times daily. 180 tablet 3   Multiple Vitamins-Minerals (WOMENS 50+ MULTI VITAMIN) TABS as directed Orally     No current facility-administered medications on file prior to visit.  [2]  Allergies Allergen Reactions   Diphenhydramine  Hcl Hypertension

## 2024-03-29 NOTE — Telephone Encounter (Signed)
 I had some cancellations this AM so I was able to schedule pt for today at 1:45

## 2024-03-29 NOTE — Patient Instructions (Signed)
 Your blood pressure goal is < 130/34mmHg   Start taking amlodipine  2.5mg  daily Continue losartan  50mg  twice a day  Looks into the sleep program called GO TO SLEEP NOW by the [facility] clinic   Continue to monitor blood pressure at home  Important lifestyle changes to control high blood pressure  Intervention  Effect on the BP   Weight loss Weight loss is one of the most effective lifestyle changes for controlling blood pressure. If you're overweight or obese, losing even a small amount of weight can help reduce blood pressure.    Blood pressure can decrease by 1 millimeter of mercury (mmHg) with each kilogram (about 2.2 pounds) of weight lost.   Exercise regularly As a general goal, aim for 30 minutes of moderate physical activity every day.    Regular physical activity can lower blood pressure by 5 - 8 mmHg.   Eat a healthy diet Eat a diet rich in whole grains, fruits, vegetables, lean meat, and low-fat dairy products. Limit processed foods, saturated fat, and sweets.    A heart-healthy diet can lower high blood pressure by 10 mmHg.   Reduce salt (sodium) in your diet Aim for 000mg  of sodium each day. Avoid deli meats, canned food, and frozen microwave meals which are high in sodium.     Limiting sodium can reduce blood pressure by 5 mmHg.   Limit alcohol One drink equals 12 ounces of beer, 5 ounces of wine, or 1.5 ounces of 80-proof liquor.    Limiting alcohol to < 1 drink a day for women or < 2 drinks a day for men can help lower blood pressure by about 4 mmHg.   To check your pressure at home you will need to:   Sit up in a chair, with feet flat on the floor and back supported. Do not cross your ankles or legs. Rest your left arm so that the cuff is about heart level. If the cuff goes on your upper arm, then just relax your arm on the table, arm of the chair, or your lap. If you have a wrist cuff, hold your wrist against your chest at heart level. Place the cuff  snugly around your arm, about 1 inch above the crease of your elbow. The cords should be inside the groove of your elbow.  Sit quietly, with the cuff in place, for about 5 minutes. Then press the power button to start a reading. Do not talk or move while the reading is taking place.  Record your readings on a sheet of paper. Although most cuffs have a memory, it is often easier to see a pattern developing when the numbers are all in front of you.  You can repeat the reading after 1-3 minutes if it is recommended.   Make sure your bladder is empty and you have not had caffeine or tobacco within the last 30 minutes   Always bring your blood pressure log with you to your appointments. If you have not brought your monitor in to be double checked for accuracy, please bring it to your next appointment.   You can find a list of validated (accurate) blood pressure cuffs at: validatebp.org

## 2024-03-29 NOTE — Assessment & Plan Note (Signed)
 Assessment: Blood pressure slightly elevated in clinic today She has had a decent amount of home readings in the 150s and 160s which are still above goal even if we use a stringent goal of less than 140/80  Reports increased stress and poor sleep Exercises fairly frequently  Plan: Start amlodipine  2.5 mg daily Continue losartan  50 mg twice daily Follow-up in clinic in 6 weeks Consider doing [facility] online program of go to sleep now

## 2024-05-05 NOTE — Progress Notes (Signed)
 Patient ID: Dana Hall                 DOB: 03/30/1941                      MRN: [id number]      HPI: Dana Hall is a 84 y.o. female referred by Dr. Lavona to HTN clinic. PMH is significant for HTN, GERD, HLD, anemia, LBBB, PE, HFmrEF.   Patient called grand mid November reporting elevated blood pressures.  She was asked to check her blood pressure for 2 weeks.  Gave these readings to the clinic.  Because patient was previously seen by Dr. Alveta and has not yet seen Dr. Lavona. Dr. Lavona requested that patient have an appointment with him or Pharm.D. clinic.  The patient was last seen by  pharmacy on 03/29/24, where she reported at-home readings of 130-160s/70-90s. Her home blood pressure cuff was assessed and found to be accurate. She was started on amlodipine  2.5 mg daily.   At today's visit, patient presents accompanied by her husband.  She reports doing well since starting amlodipine .  She brings in a very detailed list of blood pressures.  Mostly in the high 1 teens to 120s over 60s.  Occasional reading in the 130s or 140s.  She reports that the lightheaded feeling that she gets upon waking has worsened slightly since starting the amlodipine .  She is taking amlodipine  at night along with her second dose of losartan .  Current HTN meds:  Losartan  50mg  twice a day Amlodipine  2.5 mg daily Previously tried: possibly amlodipine , lisinopril  (changed to losartan ) BP goal: <130/80  Family History:  Family History  Problem Relation Age of Onset   Heart disease Mother    Breast cancer Neg Hx    Social History: No tobacco, rare alcohol  Diet: no caffeine Does cook with salt sometimes, doesn't add to food   Exercise: walks 2 miles 2-3 times per week  Home BP readings: Mostly 120s/ 60s heart rate 60s to 70s  Wt Readings from Last 3 Encounters:  07/01/23 127 lb (57.6 kg)  06/23/23 129 lb (58.5 kg)  06/05/23 125 lb (56.7 kg)   BP Readings from Last 3 Encounters:   05/10/24 126/62  03/29/24 138/72  01/05/24 128/72   Pulse Readings from Last 3 Encounters:  05/10/24 69  03/29/24 69  01/05/24 72   Renal function: CrCl cannot be calculated (Patient's most recent lab result is older than the maximum 21 days allowed.).  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  Assessment/Plan:     1. Hypertension -  Hypertension Assessment: Blood pressure at home and in clinic at goal Reviewed blood pressure goal less than 130/80 Increase lightheadedness upon waking.  She is taking amlodipine  in the evening along with  her second losartan . Still walking a few miles several times a week Tries to be very cognizant of sodium.  No caffeine intake  Plan: Will change amlodipine  from evening dosing to a.m. dosing to see if this helps.  Concerned she may be dipping too low in the evening.  I have asked patient to let me know if this does not help. Continue amlodipine  2.5 mg daily and losartan  50 mg twice a day Follow-up as needed    Thank you,  Aaronmichael Brumbaugh D Mclain Freer, Pharm.Dana Hall, CPP [facility] [address], [place], [place] [address]  Phone: [phone]; Fax: [phone]      [1]  Current Outpatient Medications on File Prior to Visit   Medication Sig Dispense Refill   acetaminophen  (TYLENOL ) 500 MG tablet Take 500 mg by mouth every 6 (six) hours as needed for mild pain.     amLODipine  (NORVASC ) 2.5 MG tablet Take 1 tablet (2.5 mg total) by mouth daily. 90 tablet 3   apixaban  (ELIQUIS ) 2.5 MG TABS tablet Take 1 tablet (2.5 mg total) by mouth 2 (two) times daily. 180 tablet 1   losartan  (COZAAR ) 50 MG tablet Take 1 tablet (50 mg total) by mouth 2 (two) times daily. 180 tablet 3   Multiple Vitamins-Minerals (WOMENS 50+ MULTI VITAMIN) TABS as directed Orally     No current facility-administered medications on file prior to visit.  [2]  Allergies Allergen Reactions   Diphenhydramine  Hcl Hypertension

## 2024-05-10 ENCOUNTER — Ambulatory Visit: Admitting: Pharmacist

## 2024-05-10 VITALS — BP 126/62 | HR 69

## 2024-05-10 DIAGNOSIS — I10 Essential (primary) hypertension: Secondary | ICD-10-CM | POA: Diagnosis not present

## 2024-05-10 NOTE — Patient Instructions (Addendum)
 Start taking amlodipine  in the morning. Please let me know if the morning dizziness doesn't improve. Continue losartan  50mg  twice a day [phone]  Your blood pressure goal is < 130/50mmHg    Important lifestyle changes to control high blood pressure  Intervention  Effect on the BP   Weight loss Weight loss is one of the most effective lifestyle changes for controlling blood pressure. If you're overweight or obese, losing even a small amount of weight can help reduce blood pressure.    Blood pressure can decrease by 1 millimeter of mercury (mmHg) with each kilogram (about 2.2 pounds) of weight lost.   Exercise regularly As a general goal, aim for 30 minutes of moderate physical activity every day.    Regular physical activity can lower blood pressure by 5 - 8 mmHg.   Eat a healthy diet Eat a diet rich in whole grains, fruits, vegetables, lean meat, and low-fat dairy products. Limit processed foods, saturated fat, and sweets.    A heart-healthy diet can lower high blood pressure by 10 mmHg.   Reduce salt (sodium) in your diet Aim for 000mg  of sodium each day. Avoid deli meats, canned food, and frozen microwave meals which are high in sodium.     Limiting sodium can reduce blood pressure by 5 mmHg.   Limit alcohol One drink equals 12 ounces of beer, 5 ounces of wine, or 1.5 ounces of 80-proof liquor.    Limiting alcohol to < 1 drink a day for women or < 2 drinks a day for men can help lower blood pressure by about 4 mmHg.   To check your pressure at home you will need to:   Sit up in a chair, with feet flat on the floor and back supported. Do not cross your ankles or legs. Rest your left arm so that the cuff is about heart level. If the cuff goes on your upper arm, then just relax your arm on the table, arm of the chair, or your lap. If you have a wrist cuff, hold your wrist against your chest at heart level. Place the cuff snugly around your arm, about 1 inch above the  crease of your elbow. The cords should be inside the groove of your elbow.  Sit quietly, with the cuff in place, for about 5 minutes. Then press the power button to start a reading. Do not talk or move while the reading is taking place.  Record your readings on a sheet of paper. Although most cuffs have a memory, it is often easier to see a pattern developing when the numbers are all in front of you.  You can repeat the reading after 1-3 minutes if it is recommended.   Make sure your bladder is empty and you have not had caffeine or tobacco within the last 30 minutes   Always bring your blood pressure log with you to your appointments. If you have not brought your monitor in to be double checked for accuracy, please bring it to your next appointment.   You can find a list of validated (accurate) blood pressure cuffs at: validatebp.org

## 2024-05-10 NOTE — Assessment & Plan Note (Signed)
 Assessment: Blood pressure at home and in clinic at goal Reviewed blood pressure goal less than 130/80 Increase lightheadedness upon waking.  She is taking amlodipine  in the evening along with her second losartan . Still walking a few miles several times a week Tries to be very cognizant of sodium.  No caffeine intake  Plan: Will change amlodipine  from evening dosing to a.m. dosing to see if this helps.  Concerned she may be dipping too low in the evening.  I have asked patient to let me know if this does not help. Continue amlodipine  2.5 mg daily and losartan  50 mg twice a day Follow-up as needed

## 2024-05-19 ENCOUNTER — Other Ambulatory Visit: Payer: Self-pay | Admitting: Family Medicine

## 2024-05-19 DIAGNOSIS — Z1231 Encounter for screening mammogram for malignant neoplasm of breast: Secondary | ICD-10-CM

## 2024-06-27 ENCOUNTER — Ambulatory Visit: Admitting: Student in an Organized Health Care Education/Training Program

## 2024-06-27 ENCOUNTER — Ambulatory Visit: Admitting: Cardiology

## 2024-06-30 ENCOUNTER — Ambulatory Visit

## 2024-07-07 ENCOUNTER — Ambulatory Visit (INDEPENDENT_AMBULATORY_CARE_PROVIDER_SITE_OTHER): Admitting: Otolaryngology
